# Patient Record
Sex: Male | Born: 1935 | Race: White | Hispanic: No | Marital: Married | Smoking: Former smoker
Health system: Southern US, Community
[De-identification: ages and names within clinical notes are randomized; demographics above are authoritative.]

## PROBLEM LIST (undated history)

## (undated) DIAGNOSIS — R0609 Other forms of dyspnea: Secondary | ICD-10-CM

## (undated) DIAGNOSIS — N183 Chronic kidney disease, stage 3 unspecified: Secondary | ICD-10-CM

## (undated) DIAGNOSIS — E039 Hypothyroidism, unspecified: Secondary | ICD-10-CM

## (undated) DIAGNOSIS — I639 Cerebral infarction, unspecified: Secondary | ICD-10-CM

## (undated) DIAGNOSIS — I209 Angina pectoris, unspecified: Secondary | ICD-10-CM

## (undated) DIAGNOSIS — Z8719 Personal history of other diseases of the digestive system: Secondary | ICD-10-CM

## (undated) DIAGNOSIS — I2510 Atherosclerotic heart disease of native coronary artery without angina pectoris: Secondary | ICD-10-CM

## (undated) DIAGNOSIS — R0902 Hypoxemia: Secondary | ICD-10-CM

## (undated) DIAGNOSIS — E119 Type 2 diabetes mellitus without complications: Secondary | ICD-10-CM

## (undated) DIAGNOSIS — R0600 Dyspnea, unspecified: Secondary | ICD-10-CM

## (undated) DIAGNOSIS — J449 Chronic obstructive pulmonary disease, unspecified: Secondary | ICD-10-CM

## (undated) DIAGNOSIS — I10 Essential (primary) hypertension: Secondary | ICD-10-CM

## (undated) DIAGNOSIS — E785 Hyperlipidemia, unspecified: Secondary | ICD-10-CM

## (undated) DIAGNOSIS — M1990 Unspecified osteoarthritis, unspecified site: Secondary | ICD-10-CM

## (undated) HISTORY — PX: CATARACT EXTRACTION W/ INTRAOCULAR LENS  IMPLANT, BILATERAL: SHX1307

## (undated) HISTORY — PX: EYE SURGERY: SHX253

## (undated) HISTORY — DX: Hypoxemia: R09.02

## (undated) HISTORY — DX: Chronic kidney disease, stage 3 (moderate): N18.3

## (undated) HISTORY — DX: Chronic obstructive pulmonary disease, unspecified: J44.9

## (undated) HISTORY — PX: CORONARY ANGIOPLASTY WITH STENT PLACEMENT: SHX49

## (undated) HISTORY — DX: Chronic kidney disease, stage 3 unspecified: N18.30

---

## 1997-07-25 HISTORY — PX: CORONARY ARTERY BYPASS GRAFT: SHX141

## 2006-03-25 ENCOUNTER — Emergency Department (HOSPITAL_COMMUNITY): Admission: EM | Admit: 2006-03-25 | Discharge: 2006-03-25 | Payer: Self-pay | Admitting: Emergency Medicine

## 2007-10-08 ENCOUNTER — Inpatient Hospital Stay (HOSPITAL_BASED_OUTPATIENT_CLINIC_OR_DEPARTMENT_OTHER): Admission: RE | Admit: 2007-10-08 | Discharge: 2007-10-08 | Payer: Self-pay | Admitting: Interventional Cardiology

## 2007-10-16 ENCOUNTER — Inpatient Hospital Stay (HOSPITAL_COMMUNITY): Admission: RE | Admit: 2007-10-16 | Discharge: 2007-10-17 | Payer: Self-pay | Admitting: Interventional Cardiology

## 2008-03-30 ENCOUNTER — Emergency Department (HOSPITAL_COMMUNITY): Admission: EM | Admit: 2008-03-30 | Discharge: 2008-03-30 | Payer: Self-pay | Admitting: Emergency Medicine

## 2008-09-30 ENCOUNTER — Emergency Department (HOSPITAL_COMMUNITY): Admission: EM | Admit: 2008-09-30 | Discharge: 2008-09-30 | Payer: Self-pay | Admitting: Emergency Medicine

## 2009-09-21 ENCOUNTER — Inpatient Hospital Stay (HOSPITAL_COMMUNITY): Admission: EM | Admit: 2009-09-21 | Discharge: 2009-09-26 | Payer: Self-pay | Admitting: Emergency Medicine

## 2009-09-25 ENCOUNTER — Encounter (INDEPENDENT_AMBULATORY_CARE_PROVIDER_SITE_OTHER): Payer: Self-pay | Admitting: Interventional Cardiology

## 2009-09-26 ENCOUNTER — Encounter (INDEPENDENT_AMBULATORY_CARE_PROVIDER_SITE_OTHER): Payer: Self-pay | Admitting: Internal Medicine

## 2009-09-26 ENCOUNTER — Ambulatory Visit: Payer: Self-pay | Admitting: Vascular Surgery

## 2010-09-23 DIAGNOSIS — I639 Cerebral infarction, unspecified: Secondary | ICD-10-CM

## 2010-09-23 HISTORY — DX: Cerebral infarction, unspecified: I63.9

## 2010-10-13 LAB — DIFFERENTIAL
Basophils Absolute: 0 10*3/uL (ref 0.0–0.1)
Basophils Relative: 0 % (ref 0–1)
Eosinophils Absolute: 0.1 10*3/uL (ref 0.0–0.7)
Eosinophils Relative: 1 % (ref 0–5)
Lymphocytes Relative: 5 % — ABNORMAL LOW (ref 12–46)
Lymphs Abs: 0.5 10*3/uL — ABNORMAL LOW (ref 0.7–4.0)
Monocytes Absolute: 0.6 10*3/uL (ref 0.1–1.0)
Monocytes Relative: 6 % (ref 3–12)
Neutro Abs: 8.8 10*3/uL — ABNORMAL HIGH (ref 1.7–7.7)
Neutrophils Relative %: 88 % — ABNORMAL HIGH (ref 43–77)

## 2010-10-13 LAB — BASIC METABOLIC PANEL
BUN: 27 mg/dL — ABNORMAL HIGH (ref 6–23)
CO2: 24 mEq/L (ref 19–32)
Calcium: 9.1 mg/dL (ref 8.4–10.5)
Chloride: 104 mEq/L (ref 96–112)
Creatinine, Ser: 1.28 mg/dL (ref 0.4–1.5)
GFR calc Af Amer: >60 mL/min (ref >60)
GFR calc non Af Amer: 55 mL/min — ABNORMAL LOW (ref >60)
Glucose, Bld: 157 mg/dL — ABNORMAL HIGH (ref 70–99)
Potassium: 4.7 mEq/L (ref 3.5–5.1)
Sodium: 135 mEq/L (ref 135–145)

## 2010-10-13 LAB — URINE CULTURE
Colony Count: NO GROWTH
Culture: NO GROWTH

## 2010-10-13 LAB — POCT CARDIAC MARKERS
CKMB, poc: <1 ng/mL — ABNORMAL LOW (ref 1.0–8.0)
Myoglobin, poc: 84.9 ng/mL (ref 12–200)
Troponin i, poc: <0.05 ng/mL (ref 0.00–0.09)

## 2010-10-13 LAB — CULTURE, BLOOD (ROUTINE X 2)

## 2010-10-13 LAB — URINALYSIS, ROUTINE W REFLEX MICROSCOPIC
Bilirubin Urine: NEGATIVE
Glucose, UA: NEGATIVE mg/dL
Ketones, ur: NEGATIVE mg/dL
Leukocytes, UA: NEGATIVE
Nitrite: NEGATIVE
Protein, ur: 100 mg/dL — AB
Specific Gravity, Urine: 1.015 (ref 1.005–1.030)
Urobilinogen, UA: 1 mg/dL (ref 0.0–1.0)
pH: 7 (ref 5.0–8.0)

## 2010-10-13 LAB — HEMOGLOBIN A1C
Hgb A1c MFr Bld: 7 % — ABNORMAL HIGH (ref 4.6–6.1)
Mean Plasma Glucose: 154 mg/dL

## 2010-10-13 LAB — CK TOTAL AND CKMB (NOT AT ARMC)
CK, MB: 0.9 ng/mL (ref 0.3–4.0)
Relative Index: 0.5 (ref 0.0–2.5)
Total CK: 198 U/L (ref 7–232)

## 2010-10-13 LAB — URINE MICROSCOPIC-ADD ON

## 2010-10-13 LAB — CBC
HCT: 39.2 % (ref 39.0–52.0)
Hemoglobin: 13.4 g/dL (ref 13.0–17.0)
MCHC: 34.3 g/dL (ref 30.0–36.0)
MCV: 89.9 fL (ref 78.0–100.0)
Platelets: 134 10*3/uL — ABNORMAL LOW (ref 150–400)
RBC: 4.36 MIL/uL (ref 4.22–5.81)
RDW: 13.2 % (ref 11.5–15.5)
WBC: 9.9 10*3/uL (ref 4.0–10.5)

## 2010-10-13 LAB — LACTIC ACID, PLASMA: Lactic Acid, Venous: 3.5 mmol/L — ABNORMAL HIGH (ref 0.5–2.2)

## 2010-10-13 LAB — BRAIN NATRIURETIC PEPTIDE: Pro B Natriuretic peptide (BNP): 281 pg/mL — ABNORMAL HIGH (ref 0.0–100.0)

## 2010-10-13 LAB — TSH: TSH: 2.017 u[IU]/mL (ref 0.350–4.500)

## 2010-10-13 LAB — TROPONIN I: Troponin I: 0.02 ng/mL (ref 0.00–0.06)

## 2010-10-17 LAB — CULTURE, BLOOD (ROUTINE X 2)
Culture: NO GROWTH
Culture: NO GROWTH

## 2010-10-17 LAB — COMPREHENSIVE METABOLIC PANEL
ALT: 13 U/L (ref 0–53)
AST: 27 U/L (ref 0–37)
Albumin: 3.3 g/dL — ABNORMAL LOW (ref 3.5–5.2)
Alkaline Phosphatase: 33 U/L — ABNORMAL LOW (ref 39–117)
BUN: 33 mg/dL — ABNORMAL HIGH (ref 6–23)
CO2: 27 mEq/L (ref 19–32)
Calcium: 8.1 mg/dL — ABNORMAL LOW (ref 8.4–10.5)
Chloride: 103 mEq/L (ref 96–112)
Creatinine, Ser: 1.85 mg/dL — ABNORMAL HIGH (ref 0.4–1.5)
GFR calc Af Amer: 43 mL/min — ABNORMAL LOW (ref >60)
GFR calc non Af Amer: 36 mL/min — ABNORMAL LOW (ref >60)
Glucose, Bld: 231 mg/dL — ABNORMAL HIGH (ref 70–99)
Potassium: 4.3 mEq/L (ref 3.5–5.1)
Sodium: 138 mEq/L (ref 135–145)
Total Bilirubin: 0.7 mg/dL (ref 0.3–1.2)
Total Protein: 6 g/dL (ref 6.0–8.3)

## 2010-10-17 LAB — CBC
HCT: 32.5 % — ABNORMAL LOW (ref 39.0–52.0)
HCT: 32.5 % — ABNORMAL LOW (ref 39.0–52.0)
HCT: 33.6 % — ABNORMAL LOW (ref 39.0–52.0)
HCT: 35.8 % — ABNORMAL LOW (ref 39.0–52.0)
Hemoglobin: 11.3 g/dL — ABNORMAL LOW (ref 13.0–17.0)
Hemoglobin: 11.3 g/dL — ABNORMAL LOW (ref 13.0–17.0)
Hemoglobin: 11.4 g/dL — ABNORMAL LOW (ref 13.0–17.0)
Hemoglobin: 12 g/dL — ABNORMAL LOW (ref 13.0–17.0)
MCHC: 33.6 g/dL (ref 30.0–36.0)
MCHC: 34 g/dL (ref 30.0–36.0)
MCHC: 34.7 g/dL (ref 30.0–36.0)
MCHC: 34.8 g/dL (ref 30.0–36.0)
MCV: 89.7 fL (ref 78.0–100.0)
MCV: 90 fL (ref 78.0–100.0)
MCV: 90.6 fL (ref 78.0–100.0)
MCV: 90.9 fL (ref 78.0–100.0)
Platelets: 104 10*3/uL — ABNORMAL LOW (ref 150–400)
Platelets: 125 10*3/uL — ABNORMAL LOW (ref 150–400)
Platelets: 135 10*3/uL — ABNORMAL LOW (ref 150–400)
Platelets: 144 10*3/uL — ABNORMAL LOW (ref 150–400)
RBC: 3.59 MIL/uL — ABNORMAL LOW (ref 4.22–5.81)
RBC: 3.61 MIL/uL — ABNORMAL LOW (ref 4.22–5.81)
RBC: 3.7 MIL/uL — ABNORMAL LOW (ref 4.22–5.81)
RBC: 3.99 MIL/uL — ABNORMAL LOW (ref 4.22–5.81)
RDW: 13 % (ref 11.5–15.5)
RDW: 13.3 % (ref 11.5–15.5)
RDW: 13.4 % (ref 11.5–15.5)
RDW: 13.4 % (ref 11.5–15.5)
WBC: 12.5 10*3/uL — ABNORMAL HIGH (ref 4.0–10.5)
WBC: 6.1 10*3/uL (ref 4.0–10.5)
WBC: 8.4 10*3/uL (ref 4.0–10.5)
WBC: 9.8 10*3/uL (ref 4.0–10.5)

## 2010-10-17 LAB — BASIC METABOLIC PANEL
BUN: 31 mg/dL — ABNORMAL HIGH (ref 6–23)
BUN: 34 mg/dL — ABNORMAL HIGH (ref 6–23)
BUN: 40 mg/dL — ABNORMAL HIGH (ref 6–23)
BUN: 40 mg/dL — ABNORMAL HIGH (ref 6–23)
CO2: 28 mEq/L (ref 19–32)
CO2: 32 mEq/L (ref 19–32)
CO2: 32 mEq/L (ref 19–32)
CO2: 34 mEq/L — ABNORMAL HIGH (ref 19–32)
Calcium: 8.2 mg/dL — ABNORMAL LOW (ref 8.4–10.5)
Calcium: 8.2 mg/dL — ABNORMAL LOW (ref 8.4–10.5)
Calcium: 8.4 mg/dL (ref 8.4–10.5)
Calcium: 8.8 mg/dL (ref 8.4–10.5)
Chloride: 100 mEq/L (ref 96–112)
Chloride: 100 mEq/L (ref 96–112)
Chloride: 99 mEq/L (ref 96–112)
Chloride: 99 mEq/L (ref 96–112)
Creatinine, Ser: 1.36 mg/dL (ref 0.4–1.5)
Creatinine, Ser: 1.43 mg/dL (ref 0.4–1.5)
Creatinine, Ser: 1.47 mg/dL (ref 0.4–1.5)
Creatinine, Ser: 1.54 mg/dL — ABNORMAL HIGH (ref 0.4–1.5)
GFR calc Af Amer: 54 mL/min — ABNORMAL LOW (ref >60)
GFR calc Af Amer: 57 mL/min — ABNORMAL LOW (ref >60)
GFR calc Af Amer: 58 mL/min — ABNORMAL LOW (ref >60)
GFR calc Af Amer: >60 mL/min (ref >60)
GFR calc non Af Amer: 44 mL/min — ABNORMAL LOW (ref >60)
GFR calc non Af Amer: 47 mL/min — ABNORMAL LOW (ref >60)
GFR calc non Af Amer: 48 mL/min — ABNORMAL LOW (ref >60)
GFR calc non Af Amer: 51 mL/min — ABNORMAL LOW (ref >60)
Glucose, Bld: 124 mg/dL — ABNORMAL HIGH (ref 70–99)
Glucose, Bld: 134 mg/dL — ABNORMAL HIGH (ref 70–99)
Glucose, Bld: 134 mg/dL — ABNORMAL HIGH (ref 70–99)
Glucose, Bld: 138 mg/dL — ABNORMAL HIGH (ref 70–99)
Potassium: 3.7 mEq/L (ref 3.5–5.1)
Potassium: 3.7 mEq/L (ref 3.5–5.1)
Potassium: 3.7 mEq/L (ref 3.5–5.1)
Potassium: 3.8 mEq/L (ref 3.5–5.1)
Sodium: 135 mEq/L (ref 135–145)
Sodium: 137 mEq/L (ref 135–145)
Sodium: 138 mEq/L (ref 135–145)
Sodium: 138 mEq/L (ref 135–145)

## 2010-10-17 LAB — GLUCOSE, CAPILLARY
Glucose-Capillary: 106 mg/dL — ABNORMAL HIGH (ref 70–99)
Glucose-Capillary: 122 mg/dL — ABNORMAL HIGH (ref 70–99)
Glucose-Capillary: 124 mg/dL — ABNORMAL HIGH (ref 70–99)
Glucose-Capillary: 125 mg/dL — ABNORMAL HIGH (ref 70–99)
Glucose-Capillary: 126 mg/dL — ABNORMAL HIGH (ref 70–99)
Glucose-Capillary: 129 mg/dL — ABNORMAL HIGH (ref 70–99)
Glucose-Capillary: 139 mg/dL — ABNORMAL HIGH (ref 70–99)
Glucose-Capillary: 141 mg/dL — ABNORMAL HIGH (ref 70–99)
Glucose-Capillary: 144 mg/dL — ABNORMAL HIGH (ref 70–99)
Glucose-Capillary: 144 mg/dL — ABNORMAL HIGH (ref 70–99)
Glucose-Capillary: 146 mg/dL — ABNORMAL HIGH (ref 70–99)
Glucose-Capillary: 164 mg/dL — ABNORMAL HIGH (ref 70–99)
Glucose-Capillary: 165 mg/dL — ABNORMAL HIGH (ref 70–99)
Glucose-Capillary: 165 mg/dL — ABNORMAL HIGH (ref 70–99)
Glucose-Capillary: 186 mg/dL — ABNORMAL HIGH (ref 70–99)
Glucose-Capillary: 207 mg/dL — ABNORMAL HIGH (ref 70–99)
Glucose-Capillary: 219 mg/dL — ABNORMAL HIGH (ref 70–99)
Glucose-Capillary: 312 mg/dL — ABNORMAL HIGH (ref 70–99)
Glucose-Capillary: 88 mg/dL (ref 70–99)

## 2010-10-17 LAB — CK TOTAL AND CKMB (NOT AT ARMC)
CK, MB: 1.2 ng/mL (ref 0.3–4.0)
CK, MB: 2 ng/mL (ref 0.3–4.0)
Relative Index: 0.6 (ref 0.0–2.5)
Relative Index: 0.6 (ref 0.0–2.5)
Total CK: 216 U/L (ref 7–232)
Total CK: 328 U/L — ABNORMAL HIGH (ref 7–232)

## 2010-10-17 LAB — TROPONIN I
Troponin I: 0.01 ng/mL (ref 0.00–0.06)
Troponin I: 0.01 ng/mL (ref 0.00–0.06)

## 2010-10-17 LAB — BRAIN NATRIURETIC PEPTIDE: Pro B Natriuretic peptide (BNP): 1038 pg/mL — ABNORMAL HIGH (ref 0.0–100.0)

## 2010-12-07 NOTE — Cardiovascular Report (Signed)
NAME:  Luke [name], Luke [name] NO.:  [id number]   MEDICAL RECORD NO.:  [id number]          PATIENT TYPE:  INP   LOCATION:  [id number]                         FACILITY:  [facility]   PHYSICIAN:  Jettie Booze, MDDATE OF BIRTH:  [birth year]   DATE OF PROCEDURE:  10/16/2007  DATE OF DISCHARGE:                            CARDIAC CATHETERIZATION   PROCEDURES PERFORMED:  PCI of the mid LAD.   OPERATOR:  Dr. Irish Lack.   INDICATIONS:  Stable angina, coronary artery disease.   PROCEDURE:  The risks and benefits of cardiac catheterization were  explained to the patient and informed consent was obtained.  The patient  was brought to the cath lab.  He was prepped and draped in the usual  sterile fashion involving his right brachial area.  A 6-French arterial  sheath was placed into the right brachial artery using modified  Seldinger technique.  Initially an IMA catheter was advanced to the [place]  as the diagnostic catheterization has shown his [place] goes to the mid  LAD.  It was distal to the insertion of the [place] where his 90% stenosis  was.  The IMA catheter was unsuccessful in engaging the [place].  We then  tried a renal double curve catheter, with no success as well.  A BR 80-  cm guide catheter was then tried and unable to reach the vessel.  Ultimately, a VB guide catheter did successfully engage the right  internal mammary artery.  A BMW wire was placed down the vessel.  The  2.5 x 15-mm Maverick balloon was then placed across the lesion and  inflated to 6 atmospheres for 26 seconds.  A 2.75 x 23-mm PROMUS stent  was then placed across the lesion and deployed at 12 atmospheres for 46  seconds.  The proximal and midportion of the stent were postdilated with  a with a 3 x 15-mm Quantum balloon to 18 atmospheres for 26 seconds and  then again to 18 atmospheres for 30 seconds.  There was an excellent  angiographic result.  TIMI III flow was maintained throughout.  The 90%  stenosis was  reduced to 05.  This was a technically difficult case  because of difficulty in engaging the right internal mammary from the  brachial approach.  In addition, the length of guide catheter was  important in terms of allowing the balloon and stent to actually reach  such a distal lesion.  This significantly increased the time required to  perform this procedure, which ultimately turned out to be about 2 hours.   IMPRESSION:  Successful percutaneous coronary intervention of the mid to  distal left anterior descending in the portion distal to the right  internal mammary artery insertion.  A 2.75 x 23-mm PROMUS stent was used  and post dilated to 3.1 mm in diameter.   RECOMMENDATIONS:  Will hydrate the patient with IV bicarb.  He will be  watched overnight.  Continue aspirin and Plavix for a minimum of a year.  If his angina is resolved with further medical therapy, will not plan  any further invasive treatment.  If he does continue  to have angina, I  would consider bringing him back for PCI of his proximal circumflex.  Will continue medical therapy for now.      Jettie Booze, MD  Electronically Signed    JSV/MEDQ  D:  10/16/2007  T:  10/16/2007  Job:  [id number]

## 2010-12-07 NOTE — Cardiovascular Report (Signed)
NAME:  Luke [name], MAGA NO.:  [id number]   MEDICAL RECORD NO.:  [id number]          PATIENT TYPE:  OIB   LOCATION:  1962                         FACILITY:  [facility]   PHYSICIAN:  Jettie Booze, MDDATE OF BIRTH:  [birth year]   DATE OF PROCEDURE:  10/08/2007  DATE OF DISCHARGE:  10/08/2007                            CARDIAC CATHETERIZATION   PROCEDURES PERFORMED:  1. Left heart catheterization.  2. Coronary angiogram.  3. Arterial and venous bypass angiography.   OPERATOR:  Jettie Booze, MD   INDICATIONS:  Angina in a patient with a history of coronary artery  disease.   PROCEDURAL NARRATIVE:  The risks and benefits of cardiac catheterization  were explained to the patient and informed consent was obtained.  The  patient was brought to the catheterization lab early and given a bicarb  drip for renal protection.  He was placed on the table and prepped and  draped in the usual sterile fashion.  His right groin was infiltrated  with 1% lidocaine.  A 4-French arterial sheath was placed into the right  femoral artery using the modified Seldinger technique.  Left coronary  artery angiography was initially attempted with a JL-4.0 catheter but  was unsuccessful.  Upon catheter exchange the guidewire went into the  left subclavian.  Therefore, an IMA catheter was advanced to the LIMA.  Digital angiography was performed in multiple projections using hand  injection of contrast.  The catheter was then exchanged out for a JL-5.0  catheter.  This was used to image the left coronary system in multiple  projections using hand injection of contrast.  A 3DRC catheter was then  placed into the ostium of the right coronary artery and the native right  coronary artery was imaged.  The catheter was then used to engage the  innominate artery.  The catheter was advanced over a wire to the [place].  Digital angiography was performed in multiple projections using hand  injection  of contrast.  A JR-4 catheter was then placed to try and  engage the SVG graft.  This was unsuccessful.  An AL-1 catheter was then  used to visualize the SVG to diagonal in multiple projections using hand  injection of contrast.  An RCB catheter was then used to engage the SVG  to PDA.   FINDINGS:  The LIMA to OM was widely patent.  The OM was small with  diffuse disease.  Proximal to the anastomosis the OM was occluded and  therefore there was no other back flow into the circumflex system from  the LIMA graft.  The left main was widely patent.  The left circumflex  was a large vessel proximally.  There is a 60-70% midvessel stenosis.  There is an OM-2, which was diffusely diseased.  I believe the OM-1 was  occluded at this what the LIMA graft went to.  The left anterior  descending was diffusely diseased proximally.  The first diagonal was  subtotally occluded.  There is competitive flow in the distal LAD  territory.  The RIMA to LAD was widely patent.  Just past the insertion  into the LAD there is an 80% lesion.  The right coronary artery was  occluded in the midportion.  The SVG to diagonal is widely patent.  The  SVG to RCA was difficult to engage but also appeared widely patent.   HEMODYNAMICS:  Left ventricular pressure of 166/20 with an LVEDP of 24  mmHg.  Aortic pressure of 167/60 with a mean aortic pressure of 104  mmHg.   IMPRESSION:  1. Patent flow to the right coronary artery and diagonal #1 through      vein graft.  2. Patent left internal mammary artery to obtuse marginal and right      internal mammary artery to left anterior descending artery.  3. Mid circumflex disease which does not appear to be bypassed.  4. Mid to distal left anterior descending artery disease past the      insertion of the right internal mammary artery, which is also not      bypassed.   RECOMMENDATIONS:  Consider PCI of the left circumflex and mid to distal  LAD for the relief of the  patient's angina.  Start him on Plavix.  He  will need lab work checked to be sure he did not have any kidney issues  during this procedure.  Our office will be in contact with him when it  is time to have his lab work done.  Continue aggressive secondary  prevention.      Jettie Booze, MD  Electronically Signed     JSV/MEDQ  D:  10/09/2007  T:  10/10/2007  Job:  [id number]

## 2010-12-10 NOTE — Discharge Summary (Signed)
[name], [name] NO.:  [id number]   MEDICAL RECORD NO.:  [id number]          PATIENT TYPE:  INP   LOCATION:  [id number]                         FACILITY:  [facility]   PHYSICIAN:  Jettie Booze, MDDATE OF BIRTH:  1933/01/11   DATE OF ADMISSION:  10/16/2007  DATE OF DISCHARGE:  10/17/2007                               DISCHARGE SUMMARY   DISCHARGE DIAGNOSES:  1. Coronary artery disease status post drug-eluting stent to the LAD      through the right brachial approach.  2. Hyperlipidemia treated.  3. Diabetes mellitus.  4. Hypertension.   HISTORY OF PRESENT ILLNESS:  [name] is a 77 year old male patient  who has known coronary artery disease in 1999, when he was in [place]  he had coronary artery bypass surgery with the following grafts:  LIMA  to the obtuse marginal, RIMA to the LAD, saphenous vein graft to the  diagonal, saphenous vein graft to the PDA.  Recently, he discovered that  he had discomfort in the central part of his chest when he gets up and  down the ladders at work, and it is relieved with rest.  He then  underwent a stress Cardiolite that was mildly abnormal.  He was then  admitted to the [facility] hospital on October 08, 2007 for cardiac  catheterization.  The catheterization showed just past insertion of the  LAD with an 80% lesion.  The LIMA to the OM was patent.  The OM was  small with diffuse disease proximal to the anastomosis and was occluded  and there was no backflow into the circumflex system from the LIMA  graft.  The left main was mildly patent.  The circumflex was large with  a 60% mid vessel stenosis.  The OM2 was diffusely diseased.  The LIMA to  the LAD was widely patent.  The saphenous vein graft to the diagonal was  patent, the saphenous vein graft to the right coronary artery appeared  patent.   For these reasons the patient was readmitted to [facility] on October 16, 2007 for percutaneous intervention to the mid LAD.  A  drug-eluting  stent was implanted to this region.  The patient remained in the  hospital overnight, was ready for discharge to home the following day.   LABORATORY DATA:  Lab studies showed hemoglobin 12.4, hematocrit 35.1,  platelets 181, white count 7.6 , sodium 136, potassium 4.3,  BUN 18, and  creatinine 1.4.   DISCHARGE MEDICATIONS:  1. Plavix 75 mg a day.  2. Enteric-coated aspirin 325 mg a day.  3. Sublingual nitroglycerin p.r.n. chest pain.  4. Maxzide 1 a day.  5. Actoplus twice a day to begin on October 19, 2007.  6. Crestor 20 mg twice a day.  7. Vasotec 20 mg twice a day.  8. Glyburide 5 mg twice a day.  9. Atenolol 25 mg once a day.   The patient is to remain on the low-sodium, heart-healthy diabetic diet.  Clean cath site gently with soap and water.  No scrubbing.  Follow up  with Dr. Irish Lack on October 30, 2007 at 11:30 a.m.  The patient is to  increase activity slowly.  No lifting over 10 pounds for 1 week.  No  driving for 2 days.      Joesphine Bare, P.A.      Jettie Booze, MD  Electronically Signed    LB/MEDQ  D:  11/13/2007  T:  11/14/2007  Job:  [id number]

## 2011-04-18 LAB — BASIC METABOLIC PANEL
BUN: 18
CO2: 30
Calcium: 8.7
Chloride: 97
Creatinine, Ser: 1.42
GFR calc Af Amer: 59 — ABNORMAL LOW
GFR calc non Af Amer: 49 — ABNORMAL LOW
Glucose, Bld: 94
Potassium: 4.3
Sodium: 136

## 2011-04-18 LAB — CBC
HCT: 35.1 — ABNORMAL LOW
Hemoglobin: 12.4 — ABNORMAL LOW
MCHC: 35.2
MCV: 87.9
Platelets: 181
RBC: 3.99 — ABNORMAL LOW
RDW: 13.2
WBC: 7.6

## 2011-04-18 LAB — HEMOGLOBIN A1C
Hgb A1c MFr Bld: 7.8 — ABNORMAL HIGH
Mean Plasma Glucose: 200

## 2011-04-18 LAB — POCT I-STAT GLUCOSE
Glucose, Bld: 149 — ABNORMAL HIGH
Operator id: 141321

## 2011-08-01 DIAGNOSIS — Z1211 Encounter for screening for malignant neoplasm of colon: Secondary | ICD-10-CM | POA: Diagnosis not present

## 2011-08-15 DIAGNOSIS — E039 Hypothyroidism, unspecified: Secondary | ICD-10-CM | POA: Diagnosis not present

## 2011-08-15 DIAGNOSIS — J40 Bronchitis, not specified as acute or chronic: Secondary | ICD-10-CM | POA: Diagnosis not present

## 2011-08-15 DIAGNOSIS — I10 Essential (primary) hypertension: Secondary | ICD-10-CM | POA: Diagnosis not present

## 2011-08-18 DIAGNOSIS — R635 Abnormal weight gain: Secondary | ICD-10-CM | POA: Diagnosis not present

## 2011-08-18 DIAGNOSIS — Z1211 Encounter for screening for malignant neoplasm of colon: Secondary | ICD-10-CM | POA: Diagnosis not present

## 2011-08-18 DIAGNOSIS — D509 Iron deficiency anemia, unspecified: Secondary | ICD-10-CM | POA: Diagnosis not present

## 2011-08-26 DIAGNOSIS — R809 Proteinuria, unspecified: Secondary | ICD-10-CM | POA: Diagnosis not present

## 2011-08-26 DIAGNOSIS — I10 Essential (primary) hypertension: Secondary | ICD-10-CM | POA: Diagnosis not present

## 2011-08-26 DIAGNOSIS — N183 Chronic kidney disease, stage 3 unspecified: Secondary | ICD-10-CM | POA: Diagnosis not present

## 2011-08-26 DIAGNOSIS — E119 Type 2 diabetes mellitus without complications: Secondary | ICD-10-CM | POA: Diagnosis not present

## 2011-08-26 DIAGNOSIS — I129 Hypertensive chronic kidney disease with stage 1 through stage 4 chronic kidney disease, or unspecified chronic kidney disease: Secondary | ICD-10-CM | POA: Diagnosis not present

## 2011-09-06 DIAGNOSIS — N183 Chronic kidney disease, stage 3 unspecified: Secondary | ICD-10-CM | POA: Diagnosis not present

## 2011-09-06 DIAGNOSIS — R809 Proteinuria, unspecified: Secondary | ICD-10-CM | POA: Diagnosis not present

## 2011-09-06 DIAGNOSIS — I120 Hypertensive chronic kidney disease with stage 5 chronic kidney disease or end stage renal disease: Secondary | ICD-10-CM | POA: Diagnosis not present

## 2011-09-20 DIAGNOSIS — I120 Hypertensive chronic kidney disease with stage 5 chronic kidney disease or end stage renal disease: Secondary | ICD-10-CM | POA: Diagnosis not present

## 2011-09-20 DIAGNOSIS — N183 Chronic kidney disease, stage 3 unspecified: Secondary | ICD-10-CM | POA: Diagnosis not present

## 2011-09-22 DIAGNOSIS — H35049 Retinal micro-aneurysms, unspecified, unspecified eye: Secondary | ICD-10-CM | POA: Diagnosis not present

## 2011-09-22 DIAGNOSIS — E11349 Type 2 diabetes mellitus with severe nonproliferative diabetic retinopathy without macular edema: Secondary | ICD-10-CM | POA: Diagnosis not present

## 2011-09-22 DIAGNOSIS — E1139 Type 2 diabetes mellitus with other diabetic ophthalmic complication: Secondary | ICD-10-CM | POA: Diagnosis not present

## 2011-09-22 DIAGNOSIS — H43829 Vitreomacular adhesion, unspecified eye: Secondary | ICD-10-CM | POA: Diagnosis not present

## 2011-09-22 DIAGNOSIS — N183 Chronic kidney disease, stage 3 unspecified: Secondary | ICD-10-CM | POA: Diagnosis not present

## 2011-09-22 DIAGNOSIS — N185 Chronic kidney disease, stage 5: Secondary | ICD-10-CM | POA: Diagnosis not present

## 2011-09-23 HISTORY — PX: COLONOSCOPY W/ POLYPECTOMY: SHX1380

## 2011-09-28 ENCOUNTER — Other Ambulatory Visit: Payer: Self-pay | Admitting: Nephrology

## 2011-09-28 DIAGNOSIS — N2889 Other specified disorders of kidney and ureter: Secondary | ICD-10-CM

## 2011-10-04 ENCOUNTER — Ambulatory Visit
Admission: RE | Admit: 2011-10-04 | Discharge: 2011-10-04 | Disposition: A | Discharge status: Home / Self Care | Payer: Medicare Other | Source: Ambulatory Visit | Attending: Nephrology | Admitting: Nephrology

## 2011-10-04 DIAGNOSIS — N2889 Other specified disorders of kidney and ureter: Secondary | ICD-10-CM

## 2011-10-04 DIAGNOSIS — N289 Disorder of kidney and ureter, unspecified: Secondary | ICD-10-CM | POA: Diagnosis not present

## 2011-10-05 DIAGNOSIS — E1139 Type 2 diabetes mellitus with other diabetic ophthalmic complication: Secondary | ICD-10-CM | POA: Diagnosis not present

## 2011-10-05 DIAGNOSIS — H43829 Vitreomacular adhesion, unspecified eye: Secondary | ICD-10-CM | POA: Diagnosis not present

## 2011-10-05 DIAGNOSIS — E11311 Type 2 diabetes mellitus with unspecified diabetic retinopathy with macular edema: Secondary | ICD-10-CM | POA: Diagnosis not present

## 2011-10-06 DIAGNOSIS — I129 Hypertensive chronic kidney disease with stage 1 through stage 4 chronic kidney disease, or unspecified chronic kidney disease: Secondary | ICD-10-CM | POA: Diagnosis not present

## 2011-10-06 DIAGNOSIS — N183 Chronic kidney disease, stage 3 unspecified: Secondary | ICD-10-CM | POA: Diagnosis not present

## 2011-10-06 DIAGNOSIS — R809 Proteinuria, unspecified: Secondary | ICD-10-CM | POA: Diagnosis not present

## 2011-10-06 DIAGNOSIS — E119 Type 2 diabetes mellitus without complications: Secondary | ICD-10-CM | POA: Diagnosis not present

## 2011-10-10 DIAGNOSIS — K259 Gastric ulcer, unspecified as acute or chronic, without hemorrhage or perforation: Secondary | ICD-10-CM | POA: Diagnosis not present

## 2011-10-10 DIAGNOSIS — Z1211 Encounter for screening for malignant neoplasm of colon: Secondary | ICD-10-CM | POA: Diagnosis not present

## 2011-10-10 DIAGNOSIS — K559 Vascular disorder of intestine, unspecified: Secondary | ICD-10-CM | POA: Diagnosis not present

## 2011-10-10 DIAGNOSIS — D649 Anemia, unspecified: Secondary | ICD-10-CM | POA: Diagnosis not present

## 2011-10-10 DIAGNOSIS — D509 Iron deficiency anemia, unspecified: Secondary | ICD-10-CM | POA: Diagnosis not present

## 2011-10-10 DIAGNOSIS — D126 Benign neoplasm of colon, unspecified: Secondary | ICD-10-CM | POA: Diagnosis not present

## 2011-10-13 DIAGNOSIS — H43829 Vitreomacular adhesion, unspecified eye: Secondary | ICD-10-CM | POA: Diagnosis not present

## 2011-10-17 DIAGNOSIS — I2510 Atherosclerotic heart disease of native coronary artery without angina pectoris: Secondary | ICD-10-CM | POA: Diagnosis not present

## 2011-10-17 DIAGNOSIS — R0602 Shortness of breath: Secondary | ICD-10-CM | POA: Diagnosis not present

## 2011-10-17 DIAGNOSIS — E782 Mixed hyperlipidemia: Secondary | ICD-10-CM | POA: Diagnosis not present

## 2011-10-17 DIAGNOSIS — E669 Obesity, unspecified: Secondary | ICD-10-CM | POA: Diagnosis not present

## 2011-10-24 DIAGNOSIS — I2510 Atherosclerotic heart disease of native coronary artery without angina pectoris: Secondary | ICD-10-CM | POA: Diagnosis not present

## 2011-10-24 DIAGNOSIS — E669 Obesity, unspecified: Secondary | ICD-10-CM | POA: Diagnosis not present

## 2011-10-24 DIAGNOSIS — R0602 Shortness of breath: Secondary | ICD-10-CM | POA: Diagnosis not present

## 2011-10-24 DIAGNOSIS — E782 Mixed hyperlipidemia: Secondary | ICD-10-CM | POA: Diagnosis not present

## 2011-10-24 HISTORY — PX: RETINAL DETACHMENT SURGERY: SHX105

## 2011-10-27 DIAGNOSIS — I120 Hypertensive chronic kidney disease with stage 5 chronic kidney disease or end stage renal disease: Secondary | ICD-10-CM | POA: Diagnosis not present

## 2011-10-27 DIAGNOSIS — K5289 Other specified noninfective gastroenteritis and colitis: Secondary | ICD-10-CM | POA: Diagnosis not present

## 2011-10-27 DIAGNOSIS — N183 Chronic kidney disease, stage 3 unspecified: Secondary | ICD-10-CM | POA: Diagnosis not present

## 2011-10-27 DIAGNOSIS — Z86010 Personal history of colonic polyps: Secondary | ICD-10-CM | POA: Diagnosis not present

## 2011-10-27 DIAGNOSIS — R635 Abnormal weight gain: Secondary | ICD-10-CM | POA: Diagnosis not present

## 2011-11-17 DIAGNOSIS — I120 Hypertensive chronic kidney disease with stage 5 chronic kidney disease or end stage renal disease: Secondary | ICD-10-CM | POA: Diagnosis not present

## 2011-11-17 DIAGNOSIS — N183 Chronic kidney disease, stage 3 unspecified: Secondary | ICD-10-CM | POA: Diagnosis not present

## 2011-12-20 DIAGNOSIS — E118 Type 2 diabetes mellitus with unspecified complications: Secondary | ICD-10-CM | POA: Diagnosis not present

## 2011-12-20 DIAGNOSIS — N183 Chronic kidney disease, stage 3 unspecified: Secondary | ICD-10-CM | POA: Diagnosis not present

## 2011-12-20 DIAGNOSIS — Z5181 Encounter for therapeutic drug level monitoring: Secondary | ICD-10-CM | POA: Diagnosis not present

## 2011-12-20 DIAGNOSIS — IMO0002 Reserved for concepts with insufficient information to code with codable children: Secondary | ICD-10-CM | POA: Diagnosis not present

## 2011-12-20 DIAGNOSIS — E039 Hypothyroidism, unspecified: Secondary | ICD-10-CM | POA: Diagnosis not present

## 2011-12-20 DIAGNOSIS — E7800 Pure hypercholesterolemia, unspecified: Secondary | ICD-10-CM | POA: Diagnosis not present

## 2011-12-20 DIAGNOSIS — I10 Essential (primary) hypertension: Secondary | ICD-10-CM | POA: Diagnosis not present

## 2011-12-20 DIAGNOSIS — E1165 Type 2 diabetes mellitus with hyperglycemia: Secondary | ICD-10-CM | POA: Diagnosis not present

## 2011-12-21 DIAGNOSIS — E1165 Type 2 diabetes mellitus with hyperglycemia: Secondary | ICD-10-CM | POA: Diagnosis not present

## 2011-12-21 DIAGNOSIS — IMO0002 Reserved for concepts with insufficient information to code with codable children: Secondary | ICD-10-CM | POA: Diagnosis not present

## 2011-12-21 DIAGNOSIS — E118 Type 2 diabetes mellitus with unspecified complications: Secondary | ICD-10-CM | POA: Diagnosis not present

## 2011-12-22 DIAGNOSIS — I10 Essential (primary) hypertension: Secondary | ICD-10-CM | POA: Diagnosis not present

## 2011-12-22 DIAGNOSIS — Z794 Long term (current) use of insulin: Secondary | ICD-10-CM | POA: Diagnosis not present

## 2011-12-22 DIAGNOSIS — I2510 Atherosclerotic heart disease of native coronary artery without angina pectoris: Secondary | ICD-10-CM | POA: Diagnosis not present

## 2011-12-22 DIAGNOSIS — E669 Obesity, unspecified: Secondary | ICD-10-CM | POA: Diagnosis not present

## 2011-12-22 DIAGNOSIS — E119 Type 2 diabetes mellitus without complications: Secondary | ICD-10-CM | POA: Diagnosis not present

## 2011-12-22 DIAGNOSIS — Z951 Presence of aortocoronary bypass graft: Secondary | ICD-10-CM | POA: Diagnosis not present

## 2011-12-26 DIAGNOSIS — E1165 Type 2 diabetes mellitus with hyperglycemia: Secondary | ICD-10-CM | POA: Diagnosis not present

## 2011-12-26 DIAGNOSIS — IMO0002 Reserved for concepts with insufficient information to code with codable children: Secondary | ICD-10-CM | POA: Diagnosis not present

## 2011-12-26 DIAGNOSIS — E118 Type 2 diabetes mellitus with unspecified complications: Secondary | ICD-10-CM | POA: Diagnosis not present

## 2011-12-26 DIAGNOSIS — E7800 Pure hypercholesterolemia, unspecified: Secondary | ICD-10-CM | POA: Diagnosis not present

## 2011-12-26 DIAGNOSIS — N183 Chronic kidney disease, stage 3 unspecified: Secondary | ICD-10-CM | POA: Diagnosis not present

## 2011-12-26 DIAGNOSIS — I10 Essential (primary) hypertension: Secondary | ICD-10-CM | POA: Diagnosis not present

## 2011-12-26 DIAGNOSIS — E039 Hypothyroidism, unspecified: Secondary | ICD-10-CM | POA: Diagnosis not present

## 2011-12-26 DIAGNOSIS — I2510 Atherosclerotic heart disease of native coronary artery without angina pectoris: Secondary | ICD-10-CM | POA: Diagnosis not present

## 2012-01-02 DIAGNOSIS — IMO0002 Reserved for concepts with insufficient information to code with codable children: Secondary | ICD-10-CM | POA: Diagnosis not present

## 2012-01-02 DIAGNOSIS — E1165 Type 2 diabetes mellitus with hyperglycemia: Secondary | ICD-10-CM | POA: Diagnosis not present

## 2012-01-03 DIAGNOSIS — E039 Hypothyroidism, unspecified: Secondary | ICD-10-CM | POA: Diagnosis not present

## 2012-01-03 DIAGNOSIS — IMO0001 Reserved for inherently not codable concepts without codable children: Secondary | ICD-10-CM | POA: Diagnosis not present

## 2012-01-03 DIAGNOSIS — I10 Essential (primary) hypertension: Secondary | ICD-10-CM | POA: Diagnosis not present

## 2012-01-09 DIAGNOSIS — E1129 Type 2 diabetes mellitus with other diabetic kidney complication: Secondary | ICD-10-CM | POA: Diagnosis not present

## 2012-01-09 DIAGNOSIS — E1165 Type 2 diabetes mellitus with hyperglycemia: Secondary | ICD-10-CM | POA: Diagnosis not present

## 2012-01-09 DIAGNOSIS — E039 Hypothyroidism, unspecified: Secondary | ICD-10-CM | POA: Diagnosis not present

## 2012-01-09 DIAGNOSIS — R7309 Other abnormal glucose: Secondary | ICD-10-CM | POA: Diagnosis not present

## 2012-01-09 DIAGNOSIS — E785 Hyperlipidemia, unspecified: Secondary | ICD-10-CM | POA: Diagnosis not present

## 2012-01-09 DIAGNOSIS — N183 Chronic kidney disease, stage 3 unspecified: Secondary | ICD-10-CM | POA: Diagnosis not present

## 2012-01-09 DIAGNOSIS — I10 Essential (primary) hypertension: Secondary | ICD-10-CM | POA: Diagnosis not present

## 2012-01-09 DIAGNOSIS — Z794 Long term (current) use of insulin: Secondary | ICD-10-CM | POA: Diagnosis not present

## 2012-01-25 DIAGNOSIS — I10 Essential (primary) hypertension: Secondary | ICD-10-CM | POA: Diagnosis not present

## 2012-01-25 DIAGNOSIS — E785 Hyperlipidemia, unspecified: Secondary | ICD-10-CM | POA: Diagnosis not present

## 2012-01-25 DIAGNOSIS — E119 Type 2 diabetes mellitus without complications: Secondary | ICD-10-CM | POA: Diagnosis not present

## 2012-01-25 DIAGNOSIS — N2581 Secondary hyperparathyroidism of renal origin: Secondary | ICD-10-CM | POA: Diagnosis not present

## 2012-01-25 DIAGNOSIS — E213 Hyperparathyroidism, unspecified: Secondary | ICD-10-CM | POA: Diagnosis not present

## 2012-01-25 DIAGNOSIS — I129 Hypertensive chronic kidney disease with stage 1 through stage 4 chronic kidney disease, or unspecified chronic kidney disease: Secondary | ICD-10-CM | POA: Diagnosis not present

## 2012-01-25 DIAGNOSIS — N183 Chronic kidney disease, stage 3 unspecified: Secondary | ICD-10-CM | POA: Diagnosis not present

## 2012-02-01 DIAGNOSIS — E785 Hyperlipidemia, unspecified: Secondary | ICD-10-CM | POA: Diagnosis not present

## 2012-02-01 DIAGNOSIS — E039 Hypothyroidism, unspecified: Secondary | ICD-10-CM | POA: Diagnosis not present

## 2012-02-01 DIAGNOSIS — I10 Essential (primary) hypertension: Secondary | ICD-10-CM | POA: Diagnosis not present

## 2012-02-01 DIAGNOSIS — Z794 Long term (current) use of insulin: Secondary | ICD-10-CM | POA: Diagnosis not present

## 2012-02-01 DIAGNOSIS — E1129 Type 2 diabetes mellitus with other diabetic kidney complication: Secondary | ICD-10-CM | POA: Diagnosis not present

## 2012-02-01 DIAGNOSIS — N183 Chronic kidney disease, stage 3 unspecified: Secondary | ICD-10-CM | POA: Diagnosis not present

## 2012-02-01 DIAGNOSIS — R7309 Other abnormal glucose: Secondary | ICD-10-CM | POA: Diagnosis not present

## 2012-02-01 DIAGNOSIS — E1165 Type 2 diabetes mellitus with hyperglycemia: Secondary | ICD-10-CM | POA: Diagnosis not present

## 2012-02-08 ENCOUNTER — Encounter (HOSPITAL_COMMUNITY): Payer: Self-pay | Admitting: *Deleted

## 2012-02-08 ENCOUNTER — Emergency Department (HOSPITAL_COMMUNITY): Payer: Medicare Other

## 2012-02-08 ENCOUNTER — Emergency Department (HOSPITAL_COMMUNITY)
Admission: EM | Admit: 2012-02-08 | Discharge: 2012-02-08 | Disposition: A | Discharge status: Home / Self Care | Payer: Medicare Other | Attending: Emergency Medicine | Admitting: Emergency Medicine

## 2012-02-08 DIAGNOSIS — J984 Other disorders of lung: Secondary | ICD-10-CM | POA: Diagnosis not present

## 2012-02-08 DIAGNOSIS — Z87891 Personal history of nicotine dependence: Secondary | ICD-10-CM | POA: Insufficient documentation

## 2012-02-08 DIAGNOSIS — E119 Type 2 diabetes mellitus without complications: Secondary | ICD-10-CM | POA: Diagnosis not present

## 2012-02-08 DIAGNOSIS — I10 Essential (primary) hypertension: Secondary | ICD-10-CM | POA: Insufficient documentation

## 2012-02-08 DIAGNOSIS — Z951 Presence of aortocoronary bypass graft: Secondary | ICD-10-CM | POA: Diagnosis not present

## 2012-02-08 DIAGNOSIS — I209 Angina pectoris, unspecified: Secondary | ICD-10-CM

## 2012-02-08 DIAGNOSIS — Z794 Long term (current) use of insulin: Secondary | ICD-10-CM | POA: Diagnosis not present

## 2012-02-08 DIAGNOSIS — Z8739 Personal history of other diseases of the musculoskeletal system and connective tissue: Secondary | ICD-10-CM | POA: Insufficient documentation

## 2012-02-08 DIAGNOSIS — E039 Hypothyroidism, unspecified: Secondary | ICD-10-CM | POA: Diagnosis not present

## 2012-02-08 DIAGNOSIS — R079 Chest pain, unspecified: Secondary | ICD-10-CM | POA: Diagnosis not present

## 2012-02-08 DIAGNOSIS — Z8673 Personal history of transient ischemic attack (TIA), and cerebral infarction without residual deficits: Secondary | ICD-10-CM | POA: Diagnosis not present

## 2012-02-08 DIAGNOSIS — R1013 Epigastric pain: Secondary | ICD-10-CM | POA: Diagnosis not present

## 2012-02-08 DIAGNOSIS — E785 Hyperlipidemia, unspecified: Secondary | ICD-10-CM | POA: Diagnosis not present

## 2012-02-08 DIAGNOSIS — R1310 Dysphagia, unspecified: Secondary | ICD-10-CM | POA: Diagnosis not present

## 2012-02-08 DIAGNOSIS — I2510 Atherosclerotic heart disease of native coronary artery without angina pectoris: Secondary | ICD-10-CM | POA: Insufficient documentation

## 2012-02-08 HISTORY — DX: Hyperlipidemia, unspecified: E78.5

## 2012-02-08 HISTORY — DX: Cerebral infarction, unspecified: I63.9

## 2012-02-08 HISTORY — DX: Unspecified osteoarthritis, unspecified site: M19.90

## 2012-02-08 HISTORY — DX: Angina pectoris, unspecified: I20.9

## 2012-02-08 HISTORY — DX: Other forms of dyspnea: R06.09

## 2012-02-08 HISTORY — DX: Atherosclerotic heart disease of native coronary artery without angina pectoris: I25.10

## 2012-02-08 HISTORY — DX: Type 2 diabetes mellitus without complications: E11.9

## 2012-02-08 HISTORY — DX: Hypothyroidism, unspecified: E03.9

## 2012-02-08 HISTORY — DX: Dyspnea, unspecified: R06.00

## 2012-02-08 HISTORY — DX: Essential (primary) hypertension: I10

## 2012-02-08 HISTORY — DX: Personal history of other diseases of the digestive system: Z87.19

## 2012-02-08 LAB — CBC
HCT: 41.5 % (ref 39.0–52.0)
Hemoglobin: 14.2 g/dL (ref 13.0–17.0)
MCH: 30.5 pg (ref 26.0–34.0)
MCHC: 34.2 g/dL (ref 30.0–36.0)
MCV: 89.2 fL (ref 78.0–100.0)
Platelets: 192 10*3/uL (ref 150–400)
RBC: 4.65 MIL/uL (ref 4.22–5.81)
RDW: 13.4 % (ref 11.5–15.5)
WBC: 5.8 10*3/uL (ref 4.0–10.5)

## 2012-02-08 LAB — POCT I-STAT TROPONIN I: Troponin i, poc: 0 ng/mL (ref 0.00–0.08)

## 2012-02-08 LAB — BASIC METABOLIC PANEL
BUN: 24 mg/dL — ABNORMAL HIGH (ref 6–23)
CO2: 28 mEq/L (ref 19–32)
Calcium: 9.6 mg/dL (ref 8.4–10.5)
Chloride: 100 mEq/L (ref 96–112)
Creatinine, Ser: 1.53 mg/dL — ABNORMAL HIGH (ref 0.50–1.35)
GFR calc Af Amer: 49 mL/min — ABNORMAL LOW (ref >90)
GFR calc non Af Amer: 42 mL/min — ABNORMAL LOW (ref >90)
Glucose, Bld: 147 mg/dL — ABNORMAL HIGH (ref 70–99)
Potassium: 4.6 mEq/L (ref 3.5–5.1)
Sodium: 139 mEq/L (ref 135–145)

## 2012-02-08 LAB — LIPASE, BLOOD: Lipase: 75 U/L — ABNORMAL HIGH (ref 11–59)

## 2012-02-08 LAB — TROPONIN I: Troponin I: <0.3 ng/mL (ref <0.30)

## 2012-02-08 MED ORDER — ASPIRIN 325 MG PO TABS
325.0000 mg | ORAL_TABLET | Freq: Once | ORAL | Status: AC
  Administered 2012-02-08: 325 mg via ORAL
  Filled 2012-02-08: qty 1

## 2012-02-08 NOTE — ED Provider Notes (Signed)
History     CSN: [id number]  Arrival date & time 02/08/12  1156   First MD Initiated Contact with Patient 02/08/12 1304      Chief Complaint  Patient presents with  . Chest Pain     HPI The pt reports multiple episodes of chest pain over the past week that are not constant. Last 5-15 minutes without SOB or diaphoresis. His wife reports decreased energy and some SOB. Denies fevers. Episode yesterday occurred after doing yard work and was not associated with food. todays episode occurred while eating breakfast and he thinks it was worse with swallowing. Hx of CABG and in 2009 a stent to his LAD. No heart cath since then. Hx of hiatal hernia. Wife reports his symptoms now are similar to before his CABG. None exertional per the pt. No melena. No hematochezia. No other complaints. No pain at this time.    Past Medical History  Diagnosis Date  . Coronary artery disease   . Hypertension   . Hyperlipemia   . Anginal pain 02/08/12  . Exertional dyspnea     "doesn't take much these days"  . Type II diabetes mellitus   . Hypothyroidism   . Stroke 09/2010    denies residual  . H/O hiatal hernia   . Arthritis     "all over"    Past Surgical History  Procedure Date  . Eye surgery   . Coronary artery bypass graft 1999    CABG X4  . Coronary angioplasty with stent placement ~ 2004    "1"  . Cataract extraction w/ intraocular lens  implant, bilateral ~ 2007  . Retinal detachment surgery 10/2011    right eye  . Colonoscopy w/ polypectomy 09/2011    "removed 7"    History reviewed. No pertinent family history.  History  Substance Use Topics  . Smoking status: Former Smoker -- 2.0 packs/day for 35 years    Types: Cigarettes    Quit date: 07/25/1993  . Smokeless tobacco: Never Used  . Alcohol Use: Yes     02/08/12 "stopped ~ 1995"      Review of Systems  All other systems reviewed and are negative.    Allergies  Review of patient's allergies indicates no known  allergies.  Home Medications   Current Outpatient Rx  Name Route Sig Dispense Refill  . AMLODIPINE BESYLATE 10 MG PO TABS Oral Take 10 mg by mouth daily.    . ASPIRIN EC 81 MG PO TBEC Oral Take 81 mg by mouth daily.    . ATENOLOL 25 MG PO TABS Oral Take 25 mg by mouth daily.    . ATORVASTATIN CALCIUM 20 MG PO TABS Oral Take 20 mg by mouth daily.    Marland Kitchen CLOPIDOGREL BISULFATE 75 MG PO TABS Oral Take 75 mg by mouth daily.    Marland Kitchen BENADRYL ALLERGY PO Oral Take 1 tablet by mouth daily as needed. For cold symptoms    . ENALAPRIL MALEATE 20 MG PO TABS Oral Take 20 mg by mouth 2 (two) times daily.    . FUROSEMIDE 40 MG PO TABS Oral Take 20-40 mg by mouth 2 (two) times daily.    . INSULIN DETEMIR 100 UNIT/ML [place] SOLN Subcutaneous Inject 50 Units into the skin 2 (two) times daily.    . INSULIN LISPRO (HUMAN) 100 UNIT/ML  [place] SOLN Subcutaneous Inject 20 Units into the skin 3 (three) times daily before meals.    Marland Kitchen LEVOTHYROXINE SODIUM 75 MCG PO TABS Oral Take  75 mcg by mouth daily.    Marland Kitchen VICTOZA [place] Subcutaneous Inject 1.8 Units into the skin every morning.      BP 154/67  Pulse 71  Resp 23  SpO2 97%  Physical Exam  Nursing note and vitals reviewed. Constitutional: He is oriented to person, place, and time. He appears well-developed and well-nourished.  HENT:  Head: Normocephalic and atraumatic.  Eyes: EOM are normal.  Neck: Normal range of motion.  Cardiovascular: Normal rate, regular rhythm, normal heart sounds and intact distal pulses.   Pulmonary/Chest: Effort normal and breath sounds normal. No respiratory distress.  Abdominal: Soft. He exhibits no distension. There is no tenderness.  Musculoskeletal: Normal range of motion.  Neurological: He is alert and oriented to person, place, and time.  Skin: Skin is warm and dry.  Psychiatric: He has a normal mood and affect. Judgment normal.    ED Course  Procedures (including critical care time)   Date: 02/09/2012  Rate: 81  Rhythm: normal sinus  rhythm  QRS Axis: normal  Intervals: normal  ST/T Wave abnormalities: none specific lateral ST changes  Conduction Disutrbances: none  Narrative Interpretation:   Old EKG Reviewed: nonspecific ST changes as compared to prior in lateral leads     Labs Reviewed  BASIC METABOLIC PANEL - Abnormal; Notable for the following:    Glucose, Bld 147 (*)     BUN 24 (*)     Creatinine, Ser 1.53 (*)     GFR calc non Af Amer 42 (*)     GFR calc Af Amer 49 (*)     All other components within normal limits  LIPASE, BLOOD - Abnormal; Notable for the following:    Lipase 75 (*)     All other components within normal limits  CBC  POCT I-STAT TROPONIN I  TROPONIN I   Dg Chest 2 View  02/08/2012  *RADIOLOGY REPORT*  Clinical Data: 78 year old male with epigastric pain and difficulty swallowing.  CHEST - 2 VIEW  Comparison: 09/21/2009.  Findings: Stable sequelae of CABG.  Cardiac size and mediastinal contours are within normal limits.  Normal lung volumes.  No pneumothorax, pulmonary edema or pleural effusion.  No consolidation.  Stable mild apical scarring. No acute osseous abnormality identified.  IMPRESSION: No acute cardiopulmonary abnormality.  Original Report Authenticated By: Randall An, M.D.    I personally reviewed the imaging tests through PACS system  I reviewed available ER/hospitalization records thought the EMR   1. Chest pain       MDM  He has both typical and atypical components to his chest pain. Given the severity of his pain and the potential to this to represent cardiac etiology I've asked his cardiologist to evaluate him in the ER. Some of his symptoms sound GI related.     5:06 PM The patient was seen by his cardiologist and this was thought to represent more of a GI/esophageal issue.  The patient will followup with his gastroenterologist.  The patient understands to return the emergency apartment for any new or worsening symptoms    Hoy Morn, MD 02/09/12  [phone]

## 2012-02-08 NOTE — ED Notes (Signed)
Family at bedside. 

## 2012-02-08 NOTE — ED Notes (Signed)
Patient reports he is having chest pain for 3 to 4 days.  Patient states he has increased pain when eating.  Patient states it feels raw.  Patient states he has some dizziness

## 2012-02-08 NOTE — Consult Note (Signed)
Admit date: 02/08/2012 Referring Physician  Dr. Jola Schmidt Primary Physician Dr. Gwenyth Allegra Primary Cardiologist  Irish Lack Reason for Consultation  chest discomfort  HPI: 78 year old man who has had coronary artery disease for many years.  He had bypass surgery in the late 90s.  In 2009, he had a drug-eluting stent to the LAD.  Most recently, he had fluid overload a few months ago.  He has been diuresis and has lost about 30 pounds.  He had a stress test in April 2013 which showed normal left ventricular function and a small apical lateral reversible defect.  Due to the fact that his symptoms resolved after diuresis, he has been managed medically.  Over the past few weeks, he has noticed that he gets a discomfort in his chest after eating or drinking certain foods.  It can happen with liquids as well.  Today he drank some water in the morning and this caused a burning that he felt go down from his esophagus into his stomach.  It only lasts less than a minute.  It does not come on with activity.  He again felt this in the emergency room and he was given an aspirin to swallow.  There is no associated nausea, diaphoresis, palpitations, syncope, lightheadedness.  Currently, he is pain-free.  He states that the symptoms he has now are different to what he had prior to his bypass surgery.     PMH:   Past Medical History  Diagnosis Date  . Coronary artery disease   . Hypertension   . Hyperlipemia   . Anginal pain 02/08/12  . Exertional dyspnea     "doesn't take much these days"  . Type II diabetes mellitus   . Hypothyroidism   . Stroke 09/2010    denies residual  . H/O hiatal hernia   . Arthritis     "all over"     PSH:   Past Surgical History  Procedure Date  . Eye surgery   . Coronary artery bypass graft 1999    CABG X4  . Coronary angioplasty with stent placement ~ 2004    "1"  . Cataract extraction w/ intraocular lens  implant, bilateral ~ 2007  . Retinal detachment surgery 10/2011      right eye  . Colonoscopy w/ polypectomy 09/2011    "removed 7"    Allergies:  Review of patient's allergies indicates no known allergies. Prior to Admit Meds:   (Not in a hospital admission) Fam HX:   History reviewed. No pertinent family history. Social HX:    History   Social History  . Marital Status: Married    Spouse Name: N/A    Number of Children: N/A  . Years of Education: N/A   Occupational History  . Not on file.   Social History Main Topics  . Smoking status: Former Smoker -- 2.0 packs/day for 35 years    Types: Cigarettes    Quit date: 07/25/1993  . Smokeless tobacco: Never Used  . Alcohol Use: Yes     02/08/12 "stopped ~ 1995"  . Drug Use: No  . Sexually Active: No   Other Topics Concern  . Not on file   Social History Narrative  . No narrative on file     ROS:  All 11 ROS were addressed and are negative except what is stated in the HPI  Physical Exam: Blood pressure 144/61, pulse 73, resp. rate 23, SpO2 95.00%.    General: Well developed, well nourished, in no  acute distress Head:    Normal cephalic and atramatic  Lungs:   Clear bilaterally to auscultation and percussion. Heart:   HRRR S1 S2 Abdomen:  abdomen soft and non-tender  Msk:   Normal strength and tone for age. Extremities:  1+ edema in bilateral lower extremities Neuro: Alert and oriented X 3. Psych:  Good affect, responds appropriately    Labs:   Lab Results  Component Value Date   WBC 5.8 02/08/2012   HGB 14.2 02/08/2012   HCT 41.5 02/08/2012   MCV 89.2 02/08/2012   PLT 192 02/08/2012    Lab 02/08/12 1309  NA 139  K 4.6  CL 100  CO2 28  BUN 24*  CREATININE 1.53*  CALCIUM 9.6  PROT --  BILITOT --  ALKPHOS --  ALT --  AST --  GLUCOSE 147*   No results found for this basename: PTT   No results found for this basename: INR, PROTIME   Lab Results  Component Value Date   CKTOTAL 328* 09/22/2009   CKMB 2.0 09/22/2009   TROPONINI <0.30 02/08/2012     No results found  for this basename: CHOL   No results found for this basename: HDL   No results found for this basename: LDLCALC   No results found for this basename: TRIG   No results found for this basename: CHOLHDL   No results found for this basename: LDLDIRECT      Radiology:  Dg Chest 2 View  02/08/2012  *RADIOLOGY REPORT*  Clinical Data: 78 year old male with epigastric pain and difficulty swallowing.  CHEST - 2 VIEW  Comparison: 09/21/2009.  Findings: Stable sequelae of CABG.  Cardiac size and mediastinal contours are within normal limits.  Normal lung volumes.  No pneumothorax, pulmonary edema or pleural effusion.  No consolidation.  Stable mild apical scarring. No acute osseous abnormality identified.  IMPRESSION: No acute cardiopulmonary abnormality.  Original Report Authenticated By: Joni Fears III, M.D.    EKG:  Normal sinus rhythm, nonspecific ST segment changes.  No change when compared to ECG from 2009.  ASSESSMENT: Atypical chest pain, coronary artery disease  PLAN:   #1) given that he has had a recent stress test, the only further evaluation for coronary artery disease would be catheterization.  His history is not consistent with angina.  It sounds more like a GI etiology for his symptoms.  There is no objective evidence for acute ischemia here in the emergency room.  He is now symptom-free.  We discussed the risks and benefits of further workup.  The patient and his family are all in agreement that they would like to pursue GI workup.  I think is very reasonable.  Even if there is a small vessel which could be revascularized which caused a small defect on the stress test, I don't think it would improve the symptoms that he came with to the emergency room today.  Committing him to long-term Plavix would also preclude any other type of invasive procedures.  2) I did recommend that he exercise more.  He should walk 30 minutes a day 5 days a week.  If he has any exertional symptoms, he'll let us  know and we will  Pursue further cardiac testing.  3) he is also comfortable not spending the night in the hospital.  He will return if he has any further symptoms.  4) continue current medical treatment for his coronary artery disease.  However followup with him in the office in a few weeks.  These recommendations were discussed with Dr. Venora Maples. Jettie Booze., MD  02/08/2012  5:20 PM

## 2012-02-08 NOTE — ED Notes (Signed)
MD at bedside. 

## 2012-02-09 ENCOUNTER — Other Ambulatory Visit: Payer: Self-pay | Admitting: Gastroenterology

## 2012-02-09 DIAGNOSIS — R1310 Dysphagia, unspecified: Secondary | ICD-10-CM

## 2012-02-10 ENCOUNTER — Ambulatory Visit (HOSPITAL_COMMUNITY)
Admission: RE | Admit: 2012-02-10 | Discharge: 2012-02-10 | Disposition: A | Discharge status: Home / Self Care | Payer: Medicare Other | Source: Ambulatory Visit | Attending: Gastroenterology | Admitting: Gastroenterology

## 2012-02-10 DIAGNOSIS — K449 Diaphragmatic hernia without obstruction or gangrene: Secondary | ICD-10-CM | POA: Diagnosis not present

## 2012-02-10 DIAGNOSIS — R1310 Dysphagia, unspecified: Secondary | ICD-10-CM | POA: Insufficient documentation

## 2012-02-10 DIAGNOSIS — K224 Dyskinesia of esophagus: Secondary | ICD-10-CM | POA: Diagnosis not present

## 2012-02-16 DIAGNOSIS — E1129 Type 2 diabetes mellitus with other diabetic kidney complication: Secondary | ICD-10-CM | POA: Diagnosis not present

## 2012-02-16 DIAGNOSIS — E039 Hypothyroidism, unspecified: Secondary | ICD-10-CM | POA: Diagnosis not present

## 2012-02-16 DIAGNOSIS — N183 Chronic kidney disease, stage 3 unspecified: Secondary | ICD-10-CM | POA: Diagnosis not present

## 2012-02-16 DIAGNOSIS — I10 Essential (primary) hypertension: Secondary | ICD-10-CM | POA: Diagnosis not present

## 2012-02-16 DIAGNOSIS — E785 Hyperlipidemia, unspecified: Secondary | ICD-10-CM | POA: Diagnosis not present

## 2012-02-16 DIAGNOSIS — Z794 Long term (current) use of insulin: Secondary | ICD-10-CM | POA: Diagnosis not present

## 2012-02-16 DIAGNOSIS — R7309 Other abnormal glucose: Secondary | ICD-10-CM | POA: Diagnosis not present

## 2012-02-16 DIAGNOSIS — E1165 Type 2 diabetes mellitus with hyperglycemia: Secondary | ICD-10-CM | POA: Diagnosis not present

## 2012-03-08 DIAGNOSIS — K449 Diaphragmatic hernia without obstruction or gangrene: Secondary | ICD-10-CM | POA: Diagnosis not present

## 2012-03-08 DIAGNOSIS — K5900 Constipation, unspecified: Secondary | ICD-10-CM | POA: Diagnosis not present

## 2012-03-08 DIAGNOSIS — R1319 Other dysphagia: Secondary | ICD-10-CM | POA: Diagnosis not present

## 2012-03-08 DIAGNOSIS — K219 Gastro-esophageal reflux disease without esophagitis: Secondary | ICD-10-CM | POA: Diagnosis not present

## 2012-03-15 DIAGNOSIS — E11349 Type 2 diabetes mellitus with severe nonproliferative diabetic retinopathy without macular edema: Secondary | ICD-10-CM | POA: Diagnosis not present

## 2012-03-15 DIAGNOSIS — H35049 Retinal micro-aneurysms, unspecified, unspecified eye: Secondary | ICD-10-CM | POA: Diagnosis not present

## 2012-03-15 DIAGNOSIS — E1139 Type 2 diabetes mellitus with other diabetic ophthalmic complication: Secondary | ICD-10-CM | POA: Diagnosis not present

## 2012-03-20 DIAGNOSIS — E1139 Type 2 diabetes mellitus with other diabetic ophthalmic complication: Secondary | ICD-10-CM | POA: Diagnosis not present

## 2012-03-20 DIAGNOSIS — E11349 Type 2 diabetes mellitus with severe nonproliferative diabetic retinopathy without macular edema: Secondary | ICD-10-CM | POA: Diagnosis not present

## 2012-03-29 DIAGNOSIS — I10 Essential (primary) hypertension: Secondary | ICD-10-CM | POA: Diagnosis not present

## 2012-03-29 DIAGNOSIS — Z23 Encounter for immunization: Secondary | ICD-10-CM | POA: Diagnosis not present

## 2012-03-29 DIAGNOSIS — E039 Hypothyroidism, unspecified: Secondary | ICD-10-CM | POA: Diagnosis not present

## 2012-03-29 DIAGNOSIS — N183 Chronic kidney disease, stage 3 unspecified: Secondary | ICD-10-CM | POA: Diagnosis not present

## 2012-03-29 DIAGNOSIS — R7309 Other abnormal glucose: Secondary | ICD-10-CM | POA: Diagnosis not present

## 2012-03-29 DIAGNOSIS — E1129 Type 2 diabetes mellitus with other diabetic kidney complication: Secondary | ICD-10-CM | POA: Diagnosis not present

## 2012-03-29 DIAGNOSIS — Z794 Long term (current) use of insulin: Secondary | ICD-10-CM | POA: Diagnosis not present

## 2012-03-29 DIAGNOSIS — E785 Hyperlipidemia, unspecified: Secondary | ICD-10-CM | POA: Diagnosis not present

## 2012-05-10 DIAGNOSIS — E039 Hypothyroidism, unspecified: Secondary | ICD-10-CM | POA: Diagnosis not present

## 2012-05-10 DIAGNOSIS — E1129 Type 2 diabetes mellitus with other diabetic kidney complication: Secondary | ICD-10-CM | POA: Diagnosis not present

## 2012-05-10 DIAGNOSIS — I10 Essential (primary) hypertension: Secondary | ICD-10-CM | POA: Diagnosis not present

## 2012-05-10 DIAGNOSIS — Z794 Long term (current) use of insulin: Secondary | ICD-10-CM | POA: Diagnosis not present

## 2012-05-10 DIAGNOSIS — E785 Hyperlipidemia, unspecified: Secondary | ICD-10-CM | POA: Diagnosis not present

## 2012-05-10 DIAGNOSIS — R7309 Other abnormal glucose: Secondary | ICD-10-CM | POA: Diagnosis not present

## 2012-05-10 DIAGNOSIS — N183 Chronic kidney disease, stage 3 unspecified: Secondary | ICD-10-CM | POA: Diagnosis not present

## 2012-05-10 DIAGNOSIS — E1165 Type 2 diabetes mellitus with hyperglycemia: Secondary | ICD-10-CM | POA: Diagnosis not present

## 2012-06-20 DIAGNOSIS — R7309 Other abnormal glucose: Secondary | ICD-10-CM | POA: Diagnosis not present

## 2012-06-20 DIAGNOSIS — E1129 Type 2 diabetes mellitus with other diabetic kidney complication: Secondary | ICD-10-CM | POA: Diagnosis not present

## 2012-06-20 DIAGNOSIS — E1165 Type 2 diabetes mellitus with hyperglycemia: Secondary | ICD-10-CM | POA: Diagnosis not present

## 2012-06-20 DIAGNOSIS — Z794 Long term (current) use of insulin: Secondary | ICD-10-CM | POA: Diagnosis not present

## 2012-06-20 DIAGNOSIS — I10 Essential (primary) hypertension: Secondary | ICD-10-CM | POA: Diagnosis not present

## 2012-06-20 DIAGNOSIS — E039 Hypothyroidism, unspecified: Secondary | ICD-10-CM | POA: Diagnosis not present

## 2012-06-20 DIAGNOSIS — N183 Chronic kidney disease, stage 3 unspecified: Secondary | ICD-10-CM | POA: Diagnosis not present

## 2012-06-20 DIAGNOSIS — E785 Hyperlipidemia, unspecified: Secondary | ICD-10-CM | POA: Diagnosis not present

## 2012-07-26 DIAGNOSIS — H43829 Vitreomacular adhesion, unspecified eye: Secondary | ICD-10-CM | POA: Diagnosis not present

## 2012-07-26 DIAGNOSIS — E11359 Type 2 diabetes mellitus with proliferative diabetic retinopathy without macular edema: Secondary | ICD-10-CM | POA: Diagnosis not present

## 2012-07-26 DIAGNOSIS — E11349 Type 2 diabetes mellitus with severe nonproliferative diabetic retinopathy without macular edema: Secondary | ICD-10-CM | POA: Diagnosis not present

## 2012-07-26 DIAGNOSIS — E1139 Type 2 diabetes mellitus with other diabetic ophthalmic complication: Secondary | ICD-10-CM | POA: Diagnosis not present

## 2012-07-26 DIAGNOSIS — H3560 Retinal hemorrhage, unspecified eye: Secondary | ICD-10-CM | POA: Diagnosis not present

## 2012-07-30 DIAGNOSIS — E785 Hyperlipidemia, unspecified: Secondary | ICD-10-CM | POA: Diagnosis not present

## 2012-07-30 DIAGNOSIS — R7309 Other abnormal glucose: Secondary | ICD-10-CM | POA: Diagnosis not present

## 2012-07-30 DIAGNOSIS — I10 Essential (primary) hypertension: Secondary | ICD-10-CM | POA: Diagnosis not present

## 2012-07-30 DIAGNOSIS — E1165 Type 2 diabetes mellitus with hyperglycemia: Secondary | ICD-10-CM | POA: Diagnosis not present

## 2012-07-30 DIAGNOSIS — E039 Hypothyroidism, unspecified: Secondary | ICD-10-CM | POA: Diagnosis not present

## 2012-07-30 DIAGNOSIS — Z794 Long term (current) use of insulin: Secondary | ICD-10-CM | POA: Diagnosis not present

## 2012-07-30 DIAGNOSIS — N183 Chronic kidney disease, stage 3 unspecified: Secondary | ICD-10-CM | POA: Diagnosis not present

## 2012-07-30 DIAGNOSIS — E1129 Type 2 diabetes mellitus with other diabetic kidney complication: Secondary | ICD-10-CM | POA: Diagnosis not present

## 2012-08-14 DIAGNOSIS — E039 Hypothyroidism, unspecified: Secondary | ICD-10-CM | POA: Diagnosis not present

## 2012-08-14 DIAGNOSIS — I10 Essential (primary) hypertension: Secondary | ICD-10-CM | POA: Diagnosis not present

## 2012-08-14 DIAGNOSIS — E1165 Type 2 diabetes mellitus with hyperglycemia: Secondary | ICD-10-CM | POA: Diagnosis not present

## 2012-08-14 DIAGNOSIS — E1129 Type 2 diabetes mellitus with other diabetic kidney complication: Secondary | ICD-10-CM | POA: Diagnosis not present

## 2012-08-14 DIAGNOSIS — E785 Hyperlipidemia, unspecified: Secondary | ICD-10-CM | POA: Diagnosis not present

## 2012-08-24 DIAGNOSIS — E119 Type 2 diabetes mellitus without complications: Secondary | ICD-10-CM | POA: Diagnosis not present

## 2012-08-24 DIAGNOSIS — D649 Anemia, unspecified: Secondary | ICD-10-CM | POA: Diagnosis not present

## 2012-08-24 DIAGNOSIS — E785 Hyperlipidemia, unspecified: Secondary | ICD-10-CM | POA: Diagnosis not present

## 2012-08-24 DIAGNOSIS — N183 Chronic kidney disease, stage 3 unspecified: Secondary | ICD-10-CM | POA: Diagnosis not present

## 2012-08-24 DIAGNOSIS — E213 Hyperparathyroidism, unspecified: Secondary | ICD-10-CM | POA: Diagnosis not present

## 2012-08-24 DIAGNOSIS — N2581 Secondary hyperparathyroidism of renal origin: Secondary | ICD-10-CM | POA: Diagnosis not present

## 2012-09-10 DIAGNOSIS — Z794 Long term (current) use of insulin: Secondary | ICD-10-CM | POA: Diagnosis not present

## 2012-09-10 DIAGNOSIS — E1129 Type 2 diabetes mellitus with other diabetic kidney complication: Secondary | ICD-10-CM | POA: Diagnosis not present

## 2012-09-10 DIAGNOSIS — N183 Chronic kidney disease, stage 3 unspecified: Secondary | ICD-10-CM | POA: Diagnosis not present

## 2012-09-10 DIAGNOSIS — E785 Hyperlipidemia, unspecified: Secondary | ICD-10-CM | POA: Diagnosis not present

## 2012-09-10 DIAGNOSIS — I10 Essential (primary) hypertension: Secondary | ICD-10-CM | POA: Diagnosis not present

## 2012-09-10 DIAGNOSIS — R7309 Other abnormal glucose: Secondary | ICD-10-CM | POA: Diagnosis not present

## 2012-09-10 DIAGNOSIS — E039 Hypothyroidism, unspecified: Secondary | ICD-10-CM | POA: Diagnosis not present

## 2012-09-27 DIAGNOSIS — N183 Chronic kidney disease, stage 3 unspecified: Secondary | ICD-10-CM | POA: Diagnosis not present

## 2012-09-27 DIAGNOSIS — I120 Hypertensive chronic kidney disease with stage 5 chronic kidney disease or end stage renal disease: Secondary | ICD-10-CM | POA: Diagnosis not present

## 2012-10-09 DIAGNOSIS — Z1211 Encounter for screening for malignant neoplasm of colon: Secondary | ICD-10-CM | POA: Diagnosis not present

## 2012-10-09 DIAGNOSIS — K219 Gastro-esophageal reflux disease without esophagitis: Secondary | ICD-10-CM | POA: Diagnosis not present

## 2012-10-09 DIAGNOSIS — Z86010 Personal history of colonic polyps: Secondary | ICD-10-CM | POA: Diagnosis not present

## 2012-10-22 DIAGNOSIS — E1129 Type 2 diabetes mellitus with other diabetic kidney complication: Secondary | ICD-10-CM | POA: Diagnosis not present

## 2012-10-22 DIAGNOSIS — E785 Hyperlipidemia, unspecified: Secondary | ICD-10-CM | POA: Diagnosis not present

## 2012-10-22 DIAGNOSIS — E039 Hypothyroidism, unspecified: Secondary | ICD-10-CM | POA: Diagnosis not present

## 2012-10-22 DIAGNOSIS — R7309 Other abnormal glucose: Secondary | ICD-10-CM | POA: Diagnosis not present

## 2012-10-22 DIAGNOSIS — I10 Essential (primary) hypertension: Secondary | ICD-10-CM | POA: Diagnosis not present

## 2012-10-22 DIAGNOSIS — Z794 Long term (current) use of insulin: Secondary | ICD-10-CM | POA: Diagnosis not present

## 2012-10-22 DIAGNOSIS — N183 Chronic kidney disease, stage 3 unspecified: Secondary | ICD-10-CM | POA: Diagnosis not present

## 2012-10-22 DIAGNOSIS — E1165 Type 2 diabetes mellitus with hyperglycemia: Secondary | ICD-10-CM | POA: Diagnosis not present

## 2012-10-24 DIAGNOSIS — E785 Hyperlipidemia, unspecified: Secondary | ICD-10-CM | POA: Diagnosis not present

## 2012-10-24 DIAGNOSIS — I10 Essential (primary) hypertension: Secondary | ICD-10-CM | POA: Diagnosis not present

## 2012-10-24 DIAGNOSIS — N183 Chronic kidney disease, stage 3 unspecified: Secondary | ICD-10-CM | POA: Diagnosis not present

## 2012-10-24 DIAGNOSIS — E119 Type 2 diabetes mellitus without complications: Secondary | ICD-10-CM | POA: Diagnosis not present

## 2012-10-24 DIAGNOSIS — I129 Hypertensive chronic kidney disease with stage 1 through stage 4 chronic kidney disease, or unspecified chronic kidney disease: Secondary | ICD-10-CM | POA: Diagnosis not present

## 2012-12-07 DIAGNOSIS — R9430 Abnormal result of cardiovascular function study, unspecified: Secondary | ICD-10-CM | POA: Diagnosis not present

## 2012-12-07 DIAGNOSIS — I5032 Chronic diastolic (congestive) heart failure: Secondary | ICD-10-CM | POA: Diagnosis not present

## 2012-12-07 DIAGNOSIS — E7800 Pure hypercholesterolemia, unspecified: Secondary | ICD-10-CM | POA: Diagnosis not present

## 2012-12-07 DIAGNOSIS — I2510 Atherosclerotic heart disease of native coronary artery without angina pectoris: Secondary | ICD-10-CM | POA: Diagnosis not present

## 2012-12-19 DIAGNOSIS — E1129 Type 2 diabetes mellitus with other diabetic kidney complication: Secondary | ICD-10-CM | POA: Diagnosis not present

## 2012-12-19 DIAGNOSIS — Z794 Long term (current) use of insulin: Secondary | ICD-10-CM | POA: Diagnosis not present

## 2012-12-19 DIAGNOSIS — E785 Hyperlipidemia, unspecified: Secondary | ICD-10-CM | POA: Diagnosis not present

## 2012-12-19 DIAGNOSIS — E039 Hypothyroidism, unspecified: Secondary | ICD-10-CM | POA: Diagnosis not present

## 2012-12-19 DIAGNOSIS — E8881 Metabolic syndrome: Secondary | ICD-10-CM | POA: Diagnosis not present

## 2012-12-19 DIAGNOSIS — N183 Chronic kidney disease, stage 3 unspecified: Secondary | ICD-10-CM | POA: Diagnosis not present

## 2012-12-19 DIAGNOSIS — E1165 Type 2 diabetes mellitus with hyperglycemia: Secondary | ICD-10-CM | POA: Diagnosis not present

## 2012-12-19 DIAGNOSIS — I10 Essential (primary) hypertension: Secondary | ICD-10-CM | POA: Diagnosis not present

## 2012-12-31 DIAGNOSIS — H43829 Vitreomacular adhesion, unspecified eye: Secondary | ICD-10-CM | POA: Diagnosis not present

## 2012-12-31 DIAGNOSIS — H35379 Puckering of macula, unspecified eye: Secondary | ICD-10-CM | POA: Diagnosis not present

## 2012-12-31 DIAGNOSIS — E11359 Type 2 diabetes mellitus with proliferative diabetic retinopathy without macular edema: Secondary | ICD-10-CM | POA: Diagnosis not present

## 2012-12-31 DIAGNOSIS — E11349 Type 2 diabetes mellitus with severe nonproliferative diabetic retinopathy without macular edema: Secondary | ICD-10-CM | POA: Diagnosis not present

## 2012-12-31 DIAGNOSIS — E1139 Type 2 diabetes mellitus with other diabetic ophthalmic complication: Secondary | ICD-10-CM | POA: Diagnosis not present

## 2013-01-30 IMAGING — RF DG ESOPHAGUS
15 of 24 series · 15 of 24 positions shown · non-contrast
Comparison: None.

CLINICAL DATA: Pain while eating.

ESOPHOGRAM/BARIUM SWALLOW
TECHNIQUE: Combined double contrast and single contrast
examination performed using effervescent crystals, thick barium
liquid, and thin barium liquid.
Fluoroscopy time:  2.37 minutes.

[Series 1: run · 1 of 1 slices shown (1 of 15)]
[im 1/1]
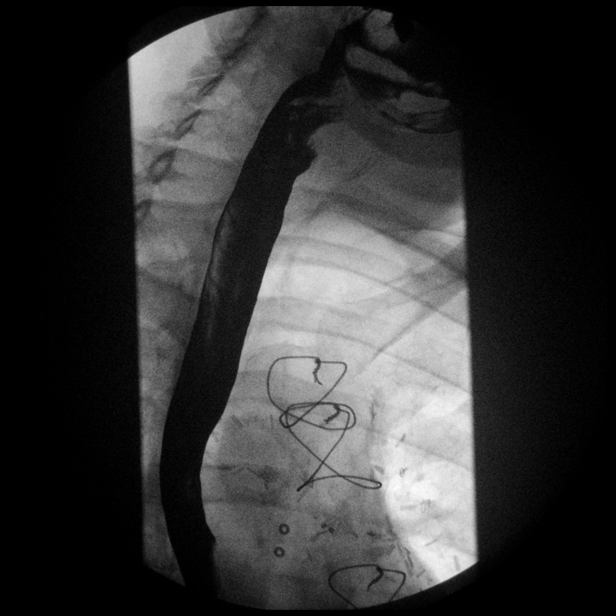

[Series 3: run · 1 of 1 slices shown (2 of 15)]
[im 1/1]
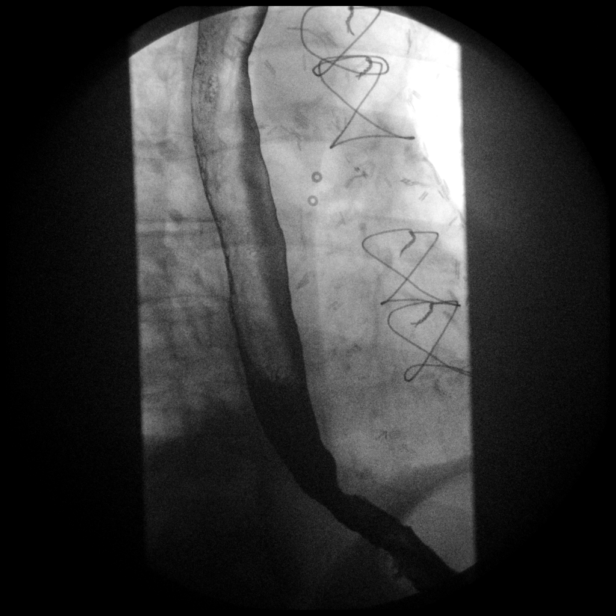

[Series 5: run · 1 of 1 slices shown (3 of 15)]
[im 1/1]
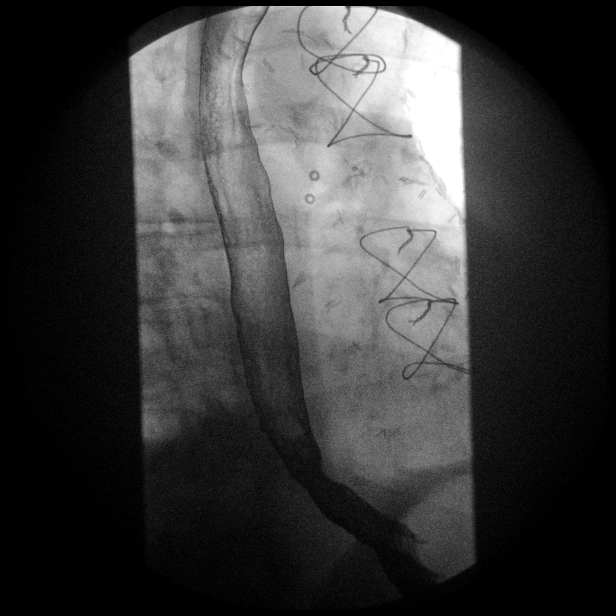

[Series 6: run · 1 of 1 slices shown (4 of 15)]
[im 1/1]
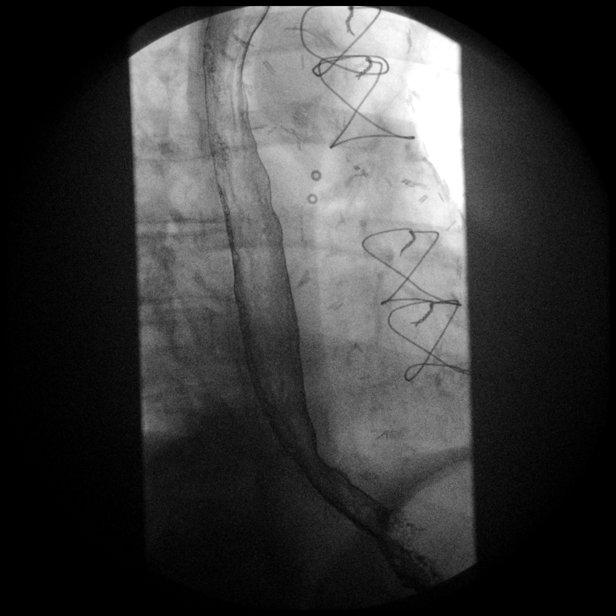

[Series 8: run · 1 of 1 slices shown (5 of 15)]
[im 1/1]
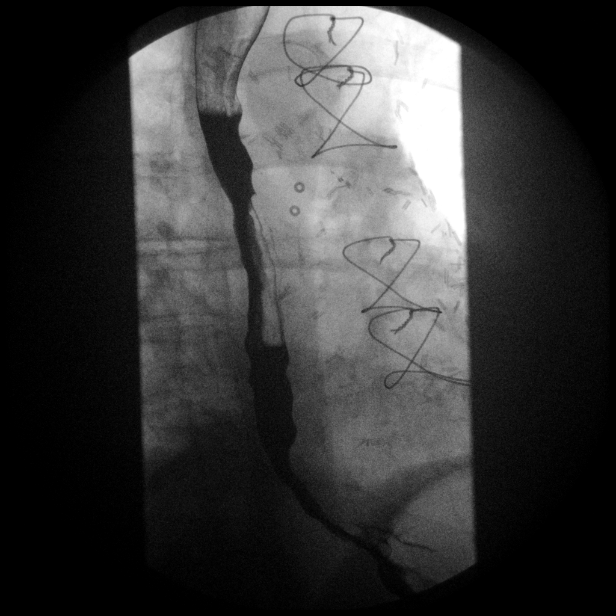

[Series 9: run · 1 of 1 slices shown (6 of 15)]
[im 1/1]
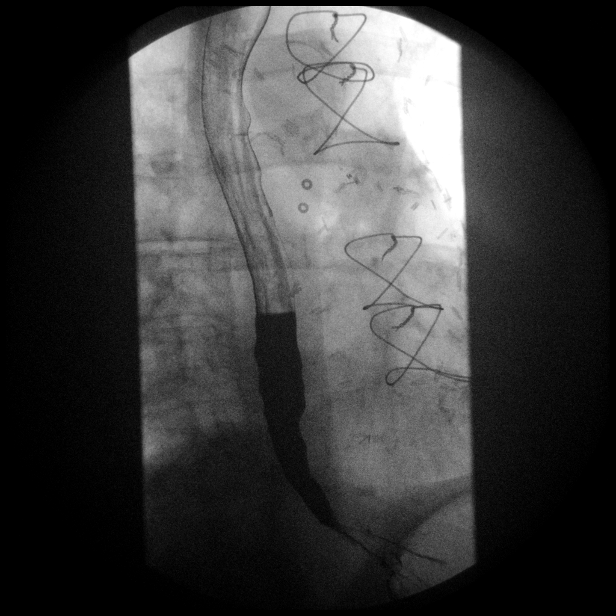

[Series 11: run · 1 of 1 slices shown (7 of 15)]
[im 1/1]
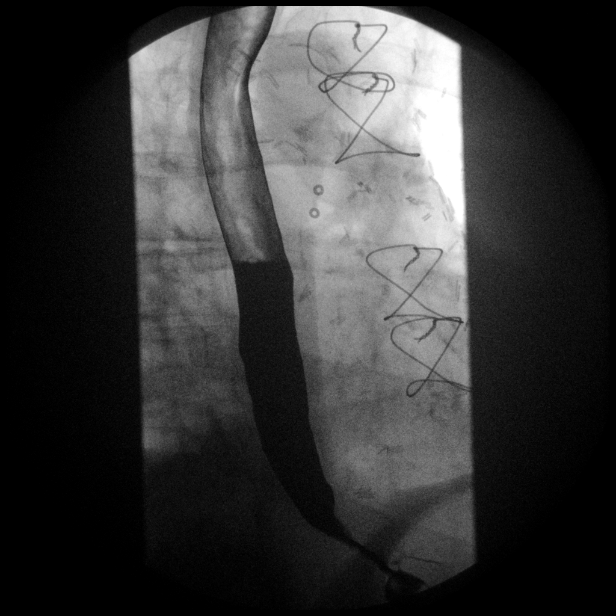

[Series 13: run · 1 of 1 slices shown (8 of 15)]
[im 1/1]
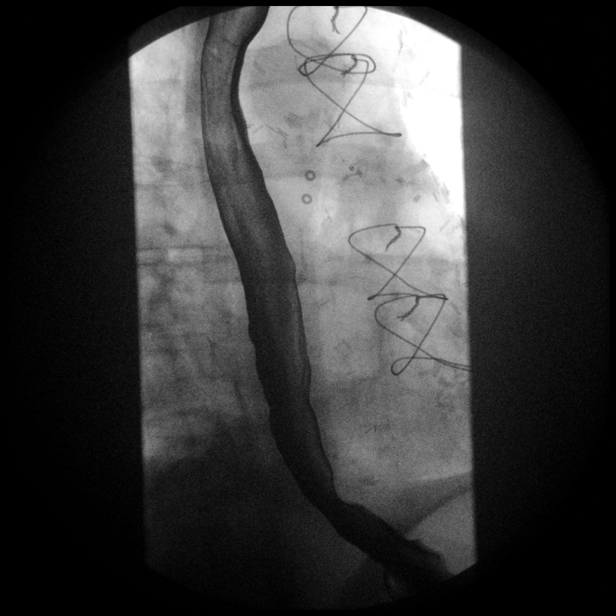

[Series 14: run · 1 of 1 slices shown (9 of 15)]
[im 1/1]
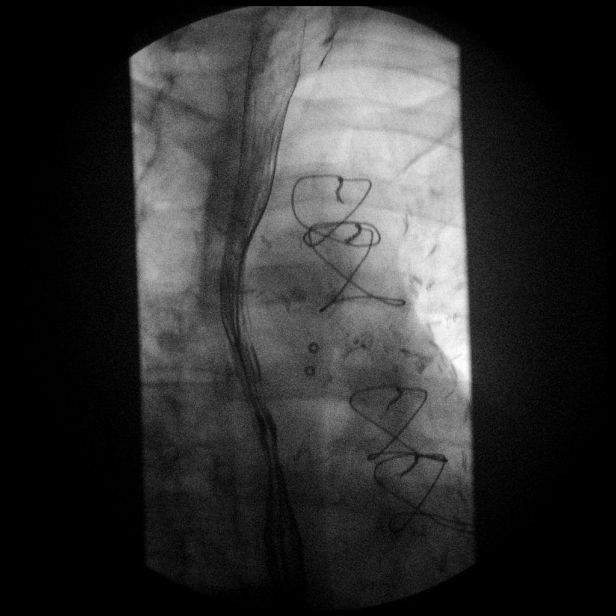

[Series 16: run · 1 of 1 slices shown (10 of 15)]
[im 1/1]
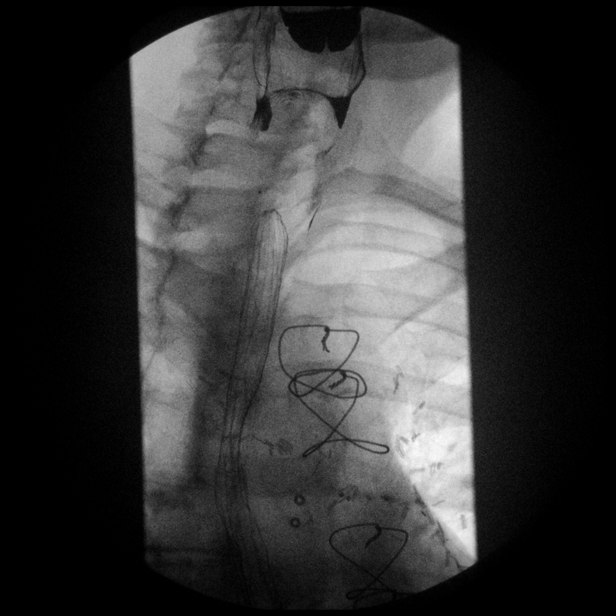

[Series 17: run · 1 of 1 slices shown (11 of 15)]
[im 1/1]
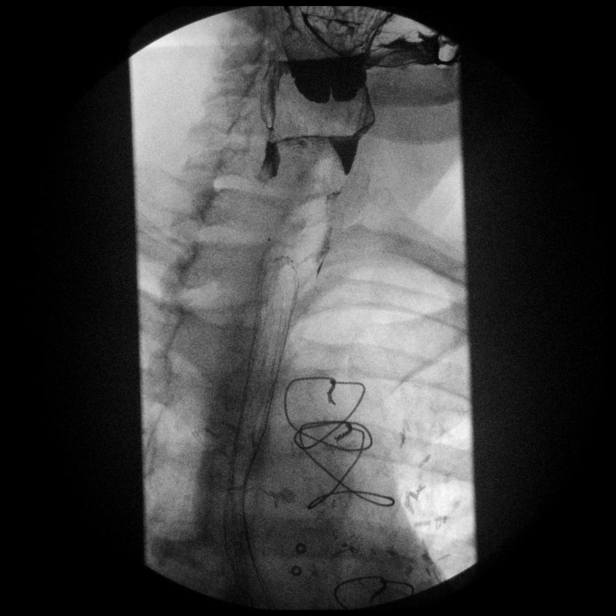

[Series 19: run · 1 of 1 slices shown (12 of 15)]
[im 1/1]
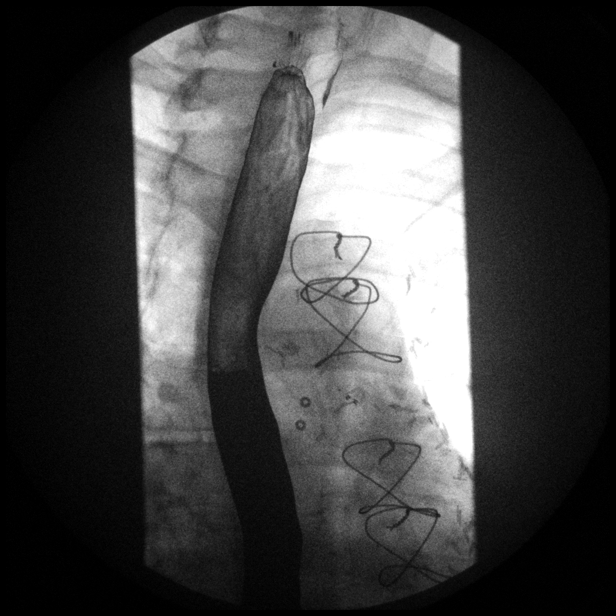

[Series 21: run · 1 of 1 slices shown (13 of 15)]
[im 1/1]
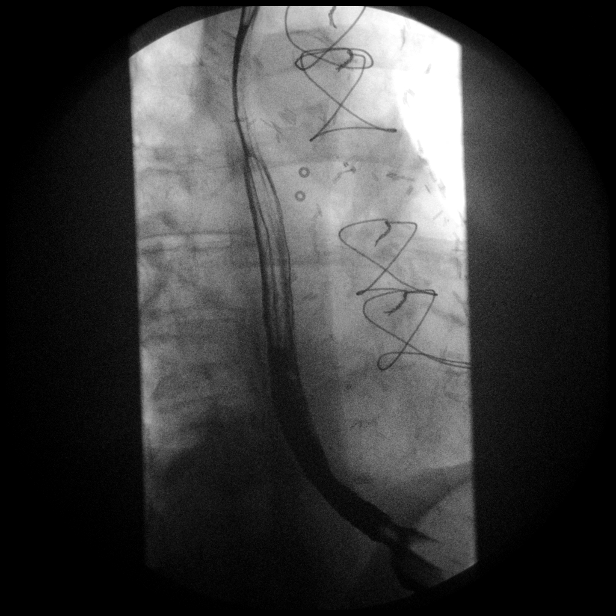

[Series 22: run · 1 of 1 slices shown (14 of 15)]
[im 1/1]
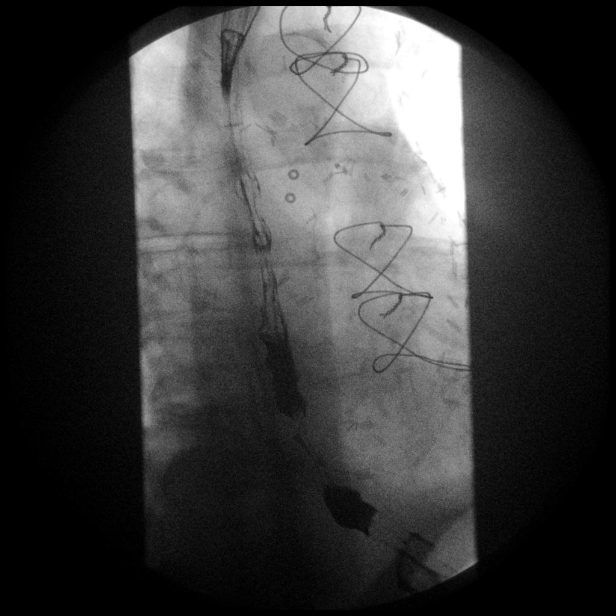

[Series 24: run · 1 of 1 slices shown (15 of 15)]
[im 1/1]
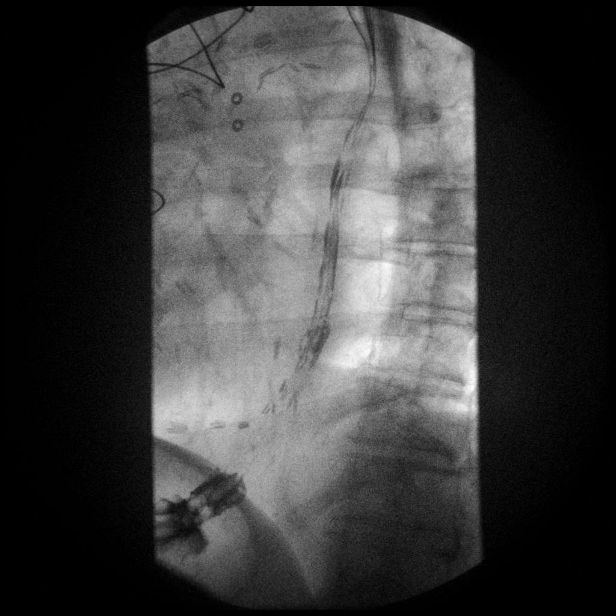

[15 of 24 positions shown; findings below may reference images not displayed]

FINDINGS: Fluoroscopic evaluation of swallowing demonstrates
disruption of all primary esophageal peristaltic waves.  Occasional
tertiary contractions noted.  No fixed stricture, fold thickening
or mass.  Small hiatal hernia.  No reflux with the water siphon
maneuver.  The patient swallowed a 13 mm barium tablet which freely
passed into the stomach.
IMPRESSION: Dysmotility.  Occasional tertiary contractions.

Small hiatal hernia.

## 2013-02-11 DIAGNOSIS — E039 Hypothyroidism, unspecified: Secondary | ICD-10-CM | POA: Diagnosis not present

## 2013-02-11 DIAGNOSIS — E1165 Type 2 diabetes mellitus with hyperglycemia: Secondary | ICD-10-CM | POA: Diagnosis not present

## 2013-02-11 DIAGNOSIS — I10 Essential (primary) hypertension: Secondary | ICD-10-CM | POA: Diagnosis not present

## 2013-02-11 DIAGNOSIS — E785 Hyperlipidemia, unspecified: Secondary | ICD-10-CM | POA: Diagnosis not present

## 2013-02-11 DIAGNOSIS — N183 Chronic kidney disease, stage 3 unspecified: Secondary | ICD-10-CM | POA: Diagnosis not present

## 2013-02-11 DIAGNOSIS — E1129 Type 2 diabetes mellitus with other diabetic kidney complication: Secondary | ICD-10-CM | POA: Diagnosis not present

## 2013-02-11 DIAGNOSIS — Z23 Encounter for immunization: Secondary | ICD-10-CM | POA: Diagnosis not present

## 2013-03-17 ENCOUNTER — Encounter (HOSPITAL_COMMUNITY): Payer: Self-pay | Admitting: Emergency Medicine

## 2013-03-17 ENCOUNTER — Inpatient Hospital Stay (HOSPITAL_COMMUNITY)
Admission: EM | Admit: 2013-03-17 | Discharge: 2013-03-20 | DRG: 683 | Disposition: A | Discharge status: Home / Self Care | Payer: Medicare Other | Attending: Internal Medicine | Admitting: Internal Medicine

## 2013-03-17 DIAGNOSIS — E785 Hyperlipidemia, unspecified: Secondary | ICD-10-CM | POA: Diagnosis present

## 2013-03-17 DIAGNOSIS — I440 Atrioventricular block, first degree: Secondary | ICD-10-CM | POA: Diagnosis present

## 2013-03-17 DIAGNOSIS — Z8673 Personal history of transient ischemic attack (TIA), and cerebral infarction without residual deficits: Secondary | ICD-10-CM

## 2013-03-17 DIAGNOSIS — E119 Type 2 diabetes mellitus without complications: Secondary | ICD-10-CM

## 2013-03-17 DIAGNOSIS — R112 Nausea with vomiting, unspecified: Secondary | ICD-10-CM

## 2013-03-17 DIAGNOSIS — R197 Diarrhea, unspecified: Secondary | ICD-10-CM | POA: Diagnosis not present

## 2013-03-17 DIAGNOSIS — I959 Hypotension, unspecified: Secondary | ICD-10-CM | POA: Diagnosis present

## 2013-03-17 DIAGNOSIS — Z9861 Coronary angioplasty status: Secondary | ICD-10-CM | POA: Diagnosis not present

## 2013-03-17 DIAGNOSIS — N184 Chronic kidney disease, stage 4 (severe): Secondary | ICD-10-CM | POA: Diagnosis present

## 2013-03-17 DIAGNOSIS — N179 Acute kidney failure, unspecified: Secondary | ICD-10-CM | POA: Diagnosis not present

## 2013-03-17 DIAGNOSIS — Z8249 Family history of ischemic heart disease and other diseases of the circulatory system: Secondary | ICD-10-CM

## 2013-03-17 DIAGNOSIS — Z9849 Cataract extraction status, unspecified eye: Secondary | ICD-10-CM

## 2013-03-17 DIAGNOSIS — E039 Hypothyroidism, unspecified: Secondary | ICD-10-CM | POA: Diagnosis present

## 2013-03-17 DIAGNOSIS — Z79899 Other long term (current) drug therapy: Secondary | ICD-10-CM

## 2013-03-17 DIAGNOSIS — Z87891 Personal history of nicotine dependence: Secondary | ICD-10-CM | POA: Diagnosis not present

## 2013-03-17 DIAGNOSIS — Z794 Long term (current) use of insulin: Secondary | ICD-10-CM | POA: Diagnosis not present

## 2013-03-17 DIAGNOSIS — I129 Hypertensive chronic kidney disease with stage 1 through stage 4 chronic kidney disease, or unspecified chronic kidney disease: Secondary | ICD-10-CM | POA: Diagnosis present

## 2013-03-17 DIAGNOSIS — Z961 Presence of intraocular lens: Secondary | ICD-10-CM | POA: Diagnosis not present

## 2013-03-17 DIAGNOSIS — E860 Dehydration: Secondary | ICD-10-CM | POA: Diagnosis present

## 2013-03-17 DIAGNOSIS — Z951 Presence of aortocoronary bypass graft: Secondary | ICD-10-CM | POA: Diagnosis not present

## 2013-03-17 DIAGNOSIS — R42 Dizziness and giddiness: Secondary | ICD-10-CM

## 2013-03-17 DIAGNOSIS — I2510 Atherosclerotic heart disease of native coronary artery without angina pectoris: Secondary | ICD-10-CM | POA: Diagnosis present

## 2013-03-17 DIAGNOSIS — A080 Rotaviral enteritis: Secondary | ICD-10-CM | POA: Diagnosis present

## 2013-03-17 LAB — CBC WITH DIFFERENTIAL/PLATELET
Basophils Absolute: 0.1 10*3/uL (ref 0.0–0.1)
Basophils Relative: 1 % (ref 0–1)
Eosinophils Absolute: 0 10*3/uL (ref 0.0–0.7)
Eosinophils Relative: 0 % (ref 0–5)
HCT: 49.2 % (ref 39.0–52.0)
Hemoglobin: 17 g/dL (ref 13.0–17.0)
Lymphocytes Relative: 11 % — ABNORMAL LOW (ref 12–46)
Lymphs Abs: 1.1 10*3/uL (ref 0.7–4.0)
MCH: 31 pg (ref 26.0–34.0)
MCHC: 34.6 g/dL (ref 30.0–36.0)
MCV: 89.8 fL (ref 78.0–100.0)
Monocytes Absolute: 1.5 10*3/uL — ABNORMAL HIGH (ref 0.1–1.0)
Monocytes Relative: 15 % — ABNORMAL HIGH (ref 3–12)
Neutro Abs: 7 10*3/uL (ref 1.7–7.7)
Neutrophils Relative %: 73 % (ref 43–77)
Platelets: 222 10*3/uL (ref 150–400)
RBC: 5.48 MIL/uL (ref 4.22–5.81)
RDW: 13.7 % (ref 11.5–15.5)
WBC: 9.7 10*3/uL (ref 4.0–10.5)

## 2013-03-17 LAB — COMPREHENSIVE METABOLIC PANEL
ALT: 31 U/L (ref 0–53)
AST: 36 U/L (ref 0–37)
Albumin: 4.6 g/dL (ref 3.5–5.2)
Alkaline Phosphatase: 76 U/L (ref 39–117)
BUN: 38 mg/dL — ABNORMAL HIGH (ref 6–23)
CO2: 15 mEq/L — ABNORMAL LOW (ref 19–32)
Calcium: 9.4 mg/dL (ref 8.4–10.5)
Chloride: 101 mEq/L (ref 96–112)
Creatinine, Ser: 4.15 mg/dL — ABNORMAL HIGH (ref 0.50–1.35)
GFR calc Af Amer: 15 mL/min — ABNORMAL LOW (ref >90)
GFR calc non Af Amer: 13 mL/min — ABNORMAL LOW (ref >90)
Glucose, Bld: 175 mg/dL — ABNORMAL HIGH (ref 70–99)
Potassium: 5.2 mEq/L — ABNORMAL HIGH (ref 3.5–5.1)
Sodium: 135 mEq/L (ref 135–145)
Total Bilirubin: 0.4 mg/dL (ref 0.3–1.2)
Total Protein: 8.1 g/dL (ref 6.0–8.3)

## 2013-03-17 LAB — URINALYSIS, ROUTINE W REFLEX MICROSCOPIC
Glucose, UA: NEGATIVE mg/dL
Hgb urine dipstick: NEGATIVE
Ketones, ur: NEGATIVE mg/dL
Leukocytes, UA: NEGATIVE
Nitrite: NEGATIVE
Protein, ur: NEGATIVE mg/dL
Specific Gravity, Urine: 1.025 (ref 1.005–1.030)
Urobilinogen, UA: 0.2 mg/dL (ref 0.0–1.0)
pH: 5 (ref 5.0–8.0)

## 2013-03-17 LAB — POCT I-STAT, CHEM 8
BUN: 38 mg/dL — ABNORMAL HIGH (ref 6–23)
BUN: 40 mg/dL — ABNORMAL HIGH (ref 6–23)
Calcium, Ion: 1.17 mmol/L (ref 1.13–1.30)
Calcium, Ion: 1.18 mmol/L (ref 1.13–1.30)
Chloride: 109 mEq/L (ref 96–112)
Chloride: 112 mEq/L (ref 96–112)
Creatinine, Ser: 4.4 mg/dL — ABNORMAL HIGH (ref 0.50–1.35)
Creatinine, Ser: 4.2 mg/dL — ABNORMAL HIGH (ref 0.50–1.35)
Glucose, Bld: 182 mg/dL — ABNORMAL HIGH (ref 70–99)
Glucose, Bld: 169 mg/dL — ABNORMAL HIGH (ref 70–99)
HCT: 50 % (ref 39.0–52.0)
HCT: 47 % (ref 39.0–52.0)
Hemoglobin: 17 g/dL (ref 13.0–17.0)
Hemoglobin: 16 g/dL (ref 13.0–17.0)
Potassium: 4.9 mEq/L (ref 3.5–5.1)
Potassium: 5.2 mEq/L — ABNORMAL HIGH (ref 3.5–5.1)
Sodium: 139 mEq/L (ref 135–145)
Sodium: 138 mEq/L (ref 135–145)
TCO2: 20 mmol/L (ref 0–100)
TCO2: 19 mmol/L (ref 0–100)

## 2013-03-17 LAB — GLUCOSE, CAPILLARY
Glucose-Capillary: 149 mg/dL — ABNORMAL HIGH (ref 70–99)
Glucose-Capillary: 180 mg/dL — ABNORMAL HIGH (ref 70–99)

## 2013-03-17 LAB — CG4 I-STAT (LACTIC ACID): Lactic Acid, Venous: 2.09 mmol/L (ref 0.5–2.2)

## 2013-03-17 MED ORDER — INSULIN DETEMIR 100 UNIT/ML ~~LOC~~ SOLN
12.0000 [IU] | Freq: Two times a day (BID) | SUBCUTANEOUS | Status: DC
  Administered 2013-03-17 – 2013-03-20 (×6): 12 [IU] via SUBCUTANEOUS
  Filled 2013-03-17 (×7): qty 0.12

## 2013-03-17 MED ORDER — PANTOPRAZOLE SODIUM 40 MG PO TBEC
40.0000 mg | DELAYED_RELEASE_TABLET | Freq: Every day | ORAL | Status: DC
  Administered 2013-03-18 – 2013-03-20 (×3): 40 mg via ORAL
  Filled 2013-03-17 (×3): qty 1

## 2013-03-17 MED ORDER — ACETAMINOPHEN 325 MG PO TABS: 650.0000 mg | ORAL_TABLET | Freq: Four times a day (QID) | ORAL | Status: DC | PRN

## 2013-03-17 MED ORDER — ATENOLOL 25 MG PO TABS
25.0000 mg | ORAL_TABLET | Freq: Every day | ORAL | Status: DC
  Administered 2013-03-18 – 2013-03-20 (×3): 25 mg via ORAL
  Filled 2013-03-17 (×3): qty 1

## 2013-03-17 MED ORDER — SODIUM CHLORIDE 0.9 % IV BOLUS (SEPSIS)
1000.0000 mL | Freq: Once | INTRAVENOUS | Status: AC
  Administered 2013-03-17: 1000 mL via INTRAVENOUS

## 2013-03-17 MED ORDER — HYDROCODONE-ACETAMINOPHEN 7.5-325 MG/15ML PO SOLN: 10.0000 mL | Freq: Once | ORAL | Status: DC

## 2013-03-17 MED ORDER — INSULIN ASPART 100 UNIT/ML ~~LOC~~ SOLN
0.0000 [IU] | Freq: Three times a day (TID) | SUBCUTANEOUS | Status: DC
  Administered 2013-03-18 (×3): 2 [IU] via SUBCUTANEOUS
  Administered 2013-03-19 – 2013-03-20 (×2): 1 [IU] via SUBCUTANEOUS

## 2013-03-17 MED ORDER — SODIUM CHLORIDE 0.9 % IV SOLN: INTRAVENOUS | Status: DC

## 2013-03-17 MED ORDER — ONDANSETRON 4 MG PO TBDP
8.0000 mg | ORAL_TABLET | Freq: Once | ORAL | Status: AC
  Administered 2013-03-17: 8 mg via ORAL
  Filled 2013-03-17: qty 2

## 2013-03-17 MED ORDER — INSULIN ASPART 100 UNIT/ML ~~LOC~~ SOLN: 0.0000 [IU] | Freq: Every day | SUBCUTANEOUS | Status: DC

## 2013-03-17 MED ORDER — AZITHROMYCIN 250 MG PO TABS: 500.0000 mg | ORAL_TABLET | Freq: Once | ORAL | Status: DC

## 2013-03-17 MED ORDER — ONDANSETRON HCL 4 MG PO TABS: 4.0000 mg | ORAL_TABLET | Freq: Four times a day (QID) | ORAL | Status: DC | PRN

## 2013-03-17 MED ORDER — ONDANSETRON HCL 4 MG/2ML IJ SOLN: 4.0000 mg | Freq: Four times a day (QID) | INTRAMUSCULAR | Status: DC | PRN

## 2013-03-17 MED ORDER — ALBUTEROL SULFATE HFA 108 (90 BASE) MCG/ACT IN AERS: 2.0000 | INHALATION_SPRAY | RESPIRATORY_TRACT | Status: DC | PRN

## 2013-03-17 MED ORDER — ACETAMINOPHEN 650 MG RE SUPP: 650.0000 mg | Freq: Four times a day (QID) | RECTAL | Status: DC | PRN

## 2013-03-17 MED ORDER — AMLODIPINE BESYLATE 5 MG PO TABS
5.0000 mg | ORAL_TABLET | Freq: Every day | ORAL | Status: DC
  Administered 2013-03-17 – 2013-03-19 (×3): 5 mg via ORAL
  Filled 2013-03-17 (×4): qty 1

## 2013-03-17 MED ORDER — HEPARIN SODIUM (PORCINE) 5000 UNIT/ML IJ SOLN
5000.0000 [IU] | Freq: Three times a day (TID) | INTRAMUSCULAR | Status: DC
  Administered 2013-03-17 – 2013-03-20 (×9): 5000 [IU] via SUBCUTANEOUS
  Filled 2013-03-17 (×11): qty 1

## 2013-03-17 MED ORDER — LEVOTHYROXINE SODIUM 75 MCG PO TABS
75.0000 ug | ORAL_TABLET | Freq: Every day | ORAL | Status: DC
  Administered 2013-03-18 – 2013-03-20 (×3): 75 ug via ORAL
  Filled 2013-03-17 (×4): qty 1

## 2013-03-17 MED ORDER — SODIUM CHLORIDE 0.9 % IV SOLN
INTRAVENOUS | Status: DC
  Administered 2013-03-17 – 2013-03-20 (×7): via INTRAVENOUS

## 2013-03-17 MED ORDER — SODIUM CHLORIDE 0.9 % IJ SOLN
3.0000 mL | Freq: Two times a day (BID) | INTRAMUSCULAR | Status: DC
  Administered 2013-03-18 – 2013-03-20 (×3): 3 mL via INTRAVENOUS

## 2013-03-17 NOTE — Progress Notes (Signed)
Received pt. From ED.Pt. From home with wife.Pt's skin intact,bottom is red but blanchable.pt. Does not uses any equipment to ambulates at home.pt. Was place on tele box # 5.Keep monitoring pt. And assessing his needs.

## 2013-03-17 NOTE — ED Provider Notes (Signed)
CSN: [id number]     Arrival date & time 03/17/13  1256 History     First MD Initiated Contact with Patient 03/17/13 1326     Chief Complaint  Patient presents with  . Nausea  . Emesis   (Consider location/radiation/quality/duration/timing/severity/associated sxs/prior Treatment) HPI  79 year old male with history of diabetes, CAD, prior stroke presents for evaluations of nausea vomiting and diarrhea. Patient states 5 days ago his wife developed nausea vomiting and diarrhea. 2 days later he developed the same symptoms. Patient reports vomiting is nonbloody, nonbilious, and having 2-3 bouts a day usually worsen after eating. Diarrhea is orange in color but nonbloody non-mucousy. States he has between 5-10 bouts a day for the past 2 days. He endorsed feeling lightheadedness, with mild headache. Patient felt dehydrated and states he has lost nearly 10 pounds within the week. Otherwise he denies fever, chills, chest pain, shortness of breath, abdominal pain, back pain, dysuria, numbness, or rash. There has been no recent travel, eating exotic food, or taking antibiotic. No recent medication changes.   Past Medical History  Diagnosis Date  . Coronary artery disease   . Hypertension   . Hyperlipemia   . Anginal pain 02/08/12  . Exertional dyspnea     "doesn't take much these days"  . Type II diabetes mellitus   . Hypothyroidism   . Stroke 09/2010    denies residual  . H/O hiatal hernia   . Arthritis     "all over"   Past Surgical History  Procedure Laterality Date  . Eye surgery    . Coronary artery bypass graft  1999    CABG X4  . Coronary angioplasty with stent placement  ~ 2004    "1"  . Cataract extraction w/ intraocular lens  implant, bilateral  ~ 2007  . Retinal detachment surgery  10/2011    right eye  . Colonoscopy w/ polypectomy  09/2011    "removed 7"   No family history on file. History  Substance Use Topics  . Smoking status: Former Smoker -- 2.00 packs/day for 35  years    Types: Cigarettes    Quit date: 07/25/1993  . Smokeless tobacco: Never Used  . Alcohol Use: Yes     Comment: 02/08/12 "stopped ~ 1995"    Review of Systems  All other systems reviewed and are negative.    Allergies  Review of patient's allergies indicates no known allergies.  Home Medications   Current Outpatient Rx  Name  Route  Sig  Dispense  Refill  . amLODipine (NORVASC) 10 MG tablet   Oral   Take 10 mg by mouth daily.         Marland Kitchen aspirin EC 81 MG tablet   Oral   Take 81 mg by mouth daily.         Marland Kitchen atenolol (TENORMIN) 25 MG tablet   Oral   Take 25 mg by mouth daily.         Marland Kitchen atorvastatin (LIPITOR) 20 MG tablet   Oral   Take 20 mg by mouth daily.         . clopidogrel (PLAVIX) 75 MG tablet   Oral   Take 75 mg by mouth daily.         . DiphenhydrAMINE HCl (BENADRYL ALLERGY PO)   Oral   Take 1 tablet by mouth daily as needed. For cold symptoms         . enalapril (VASOTEC) 20 MG tablet   Oral  Take 20 mg by mouth 2 (two) times daily.         . furosemide (LASIX) 40 MG tablet   Oral   Take 20-40 mg by mouth 2 (two) times daily.         . insulin detemir (LEVEMIR) 100 UNIT/ML injection   Subcutaneous   Inject 50 Units into the skin 2 (two) times daily.         . insulin lispro (HUMALOG) 100 UNIT/ML injection   Subcutaneous   Inject 20 Units into the skin 3 (three) times daily before meals.         Marland Kitchen levothyroxine (SYNTHROID, LEVOTHROID) 75 MCG tablet   Oral   Take 75 mcg by mouth daily.         . Liraglutide (VICTOZA [place])   Subcutaneous   Inject 1.8 Units into the skin every morning.          BP 92/42  Pulse 76  Temp(Src) 97.5 F (36.4 C) (Oral)  Resp 16  SpO2 93% Physical Exam  Nursing note and vitals reviewed. Constitutional: He appears well-developed and well-nourished. No distress.  Awake, alert, nontoxic appearance  HENT:  Head: Atraumatic.  Mouth/Throat: Oropharynx is clear and moist.  Eyes:  Conjunctivae are normal. Right eye exhibits no discharge. Left eye exhibits no discharge.  Neck: Normal range of motion. Neck supple.  Cardiovascular: Normal rate, regular rhythm and intact distal pulses.   Pulmonary/Chest: Effort normal. No respiratory distress. He exhibits no tenderness.  Abdominal: Soft. There is no tenderness. There is no rebound.  Musculoskeletal: He exhibits no edema and no tenderness.  ROM appears intact, no obvious focal weakness  Neurological: He is alert.  Skin: Skin is warm and dry. No rash noted.  Psychiatric: He has a normal mood and affect.    ED Course   Procedures (including critical care time)   Date: 03/17/2013  Rate: 70  Rhythm: normal sinus rhythm  QRS Axis: normal  Intervals: PR prolonged  ST/T Wave abnormalities: nonspecific ST/T changes  Conduction Disutrbances:first-degree A-V block   Narrative Interpretation:   Old EKG Reviewed: unchanged    1:55 PM Patient with nausea, vomiting, diarrhea to suggest likely viral infection since wife has same symptoms. No recent antibiotic use, low suspicion for Clostridium difficile infection. No significant abdominal pain to suggest diverticulosis diverticulitis or colitis. He is nontoxic in appearance however he is hypotensive with blood pressure of 92/42. We'll begin IV hydration, check basic labs, treat his symptoms and will continue to monitor. Care discussed with attending.  3:07 PM Patient has not elevated and potassium of 5.2. No EKG changes. He does appear to have evidence of acute renal injury with a BUN of 38 and creatinine of 4.15. This likely reflects his recent nausea vomiting diarrhea. Will continue with IV fluid hydration and will consult for admission for further management.  3:18 PM I have consulted [facility], Dr. Eliseo Squires, who agrees to admit pt to tele, team 10, under her care.  Pt's BP improves to 119/48 after 1L NS.  Pt mentating at baseline, not tachycardic.    Labs Reviewed   CBC WITH DIFFERENTIAL - Abnormal; Notable for the following:    Lymphocytes Relative 11 (*)    Monocytes Relative 15 (*)    Monocytes Absolute 1.5 (*)    All other components within normal limits  COMPREHENSIVE METABOLIC PANEL - Abnormal; Notable for the following:    Potassium 5.2 (*)    CO2 15 (*)    Glucose,  Bld 175 (*)    BUN 38 (*)    Creatinine, Ser 4.15 (*)    GFR calc non Af Amer 13 (*)    GFR calc Af Amer 15 (*)    All other components within normal limits  POCT I-STAT, CHEM 8 - Abnormal; Notable for the following:    BUN 38 (*)    Creatinine, Ser 4.40 (*)    Glucose, Bld 182 (*)    All other components within normal limits  URINALYSIS, ROUTINE W REFLEX MICROSCOPIC   No results found. 1. Nausea vomiting and diarrhea   2. Acute renal injury     MDM  BP 119/48  Pulse 69  Temp(Src) 97.5 F (36.4 C) (Oral)  Resp 20  SpO2 94%  I have reviewed nursing notes and vital signs.  I reviewed available ER/hospitalization records thought the EMR    Domenic Moras, [place] 03/17/13 1520

## 2013-03-17 NOTE — ED Notes (Signed)
Pt. Stated, I 've had N/V/D since Thursday and I also have a headache.  My lt. Ear seems like its plugged up,

## 2013-03-17 NOTE — H&P (Signed)
[facility] History and Physical  Luke [name] [id number] DOB: [birth year] DOA: 03/17/2013  Referring physician: er PCP: Milagros Evener, MD  Specialists:   Chief Complaint: N/V/D  HPI: Luke [name] is a 79 y.o. male  Who started getting n/v/d on Friday.  The nausea and vomiting stopped but diarrhea continues.  His wife is also in the ER, sick with the same illness- her started on Wednesday.  When he was vomiting it was nonbloody, nonbilious, and had 2-3 bouts a day. Diarrhea is orange in color but nonbloody non-mucousy. States he has between 5-10 bouts a day for the past 2 days. He endorsed feeling lightheadedness, with mild headache. Patient felt dehydrated and states he has lost nearly 10 pounds within the week. Otherwise he denies fever, chills, chest pain, shortness of breath, abdominal pain, back pain, dysuria, numbness, or rash. There has been no recent travel, eating exotic food, or taking antibiotic  In the ER, his  Cr was found to be elevated.  He was still taking lasix despite the N/V/D.      Review of Systems: all systems reviewed, negative unless stated above    Past Medical History  Diagnosis Date  . Coronary artery disease   . Hypertension   . Hyperlipemia   . Anginal pain 02/08/12  . Exertional dyspnea     "doesn't take much these days"  . Type II diabetes mellitus   . Hypothyroidism   . Stroke 09/2010    denies residual  . H/O hiatal hernia   . Arthritis     "all over"   Past Surgical History  Procedure Laterality Date  . Eye surgery    . Coronary artery bypass graft  1999    CABG X4  . Coronary angioplasty with stent placement  ~ 2004    "1"  . Cataract extraction w/ intraocular lens  implant, bilateral  ~ 2007  . Retinal detachment surgery  10/2011    right eye  . Colonoscopy w/ polypectomy  09/2011    "removed 7"   Social History:  reports that he quit smoking about 19 years ago. His smoking use included Cigarettes. He has a 70 pack-year  smoking history. He has never used smokeless tobacco. He reports that  drinks alcohol. He reports that he does not use illicit drugs.  No Known Allergies  Family Hx: HTN  Prior to Admission medications   Medication Sig Start Date End Date Taking? Authorizing Provider  amLODipine (NORVASC) 10 MG tablet Take 5 mg by mouth at bedtime.   Yes Historical Provider, MD  Aromatic Inhalants (VICKS VAPOINHALER IN) Inhale 1 Inhaler into the lungs daily as needed (For congestion).   Yes Historical Provider, MD  atenolol (TENORMIN) 25 MG tablet Take 25 mg by mouth daily.   Yes Historical Provider, MD  atorvastatin (LIPITOR) 20 MG tablet Take 20 mg by mouth daily.   Yes Historical Provider, MD  enalapril (VASOTEC) 20 MG tablet Take 20 mg by mouth 2 (two) times daily.   Yes Historical Provider, MD  furosemide (LASIX) 40 MG tablet Take 40 mg by mouth daily.   Yes Historical Provider, MD  insulin aspart (NOVOLOG) 100 UNIT/ML injection Inject 8 Units into the skin 3 (three) times daily with meals.   Yes Historical Provider, MD  insulin detemir (LEVEMIR) 100 UNIT/ML injection Inject 12 Units into the skin 2 (two) times daily.   Yes Historical Provider, MD  insulin lispro (HUMALOG) 100 UNIT/ML injection Inject 20 Units into the skin 3 (three) times  daily before meals.   Yes Historical Provider, MD  levothyroxine (SYNTHROID, LEVOTHROID) 75 MCG tablet Take 75 mcg by mouth daily.   Yes Historical Provider, MD  Liraglutide (VICTOZA [place]) Inject 1.8 Units into the skin every morning.   Yes Historical Provider, MD  omeprazole (PRILOSEC) 40 MG capsule Take 40 mg by mouth daily.   Yes Historical Provider, MD  polyvinyl alcohol (LIQUIFILM TEARS) 1.4 % ophthalmic solution Place 1 drop into both eyes as needed (For dry eyes).   Yes Historical Provider, MD   Physical Exam: Filed Vitals:   03/17/13 1514  BP: 119/48  Pulse: 69  Temp:   Resp: 20     General:  A+OX3, NAD  Eyes: wnl  ENT: dry mucous membranes  Neck:  supple  Cardiovascular: rrr  Respiratory: clear anterior  Abdomen: increased bowel sounds, soft, NT  Skin: no rashes or lesions  Musculoskeletal: moves all 4 ext, no edema  Psychiatric: no SI/no HI  Neurologic: CN 2-12 intact  Labs on Admission:  Basic Metabolic Panel:  Recent Labs Lab 03/17/13 1322 03/17/13 1442  NA 135 139  K 5.2* 4.9  CL 101 109  CO2 15*  --   GLUCOSE 175* 182*  BUN 38* 38*  CREATININE 4.15* 4.40*  CALCIUM 9.4  --    Liver Function Tests:  Recent Labs Lab 03/17/13 1322  AST 36  ALT 31  ALKPHOS 76  BILITOT 0.4  PROT 8.1  ALBUMIN 4.6   No results found for this basename: LIPASE, AMYLASE,  in the last 168 hours No results found for this basename: AMMONIA,  in the last 168 hours CBC:  Recent Labs Lab 03/17/13 1322 03/17/13 1442  WBC 9.7  --   NEUTROABS 7.0  --   HGB 17.0 17.0  HCT 49.2 50.0  MCV 89.8  --   PLT 222  --    Cardiac Enzymes: No results found for this basename: CKTOTAL, CKMB, CKMBINDEX, TROPONINI,  in the last 168 hours  BNP (last 3 results) No results found for this basename: PROBNP,  in the last 8760 hours CBG: No results found for this basename: GLUCAP,  in the last 168 hours  Radiological Exams on Admission: No results found.  EKG: Independently reviewed. NSR with 1 deg AV block  Assessment/Plan Principal Problem:   AKI (acute kidney injury) Active Problems:   N&V (nausea and vomiting)   Diarrhea   DM (diabetes mellitus)   Hypotension   Dizziness   1. AKI on CKD- IVF, hold nephrotoxic agents, suspect from hypotension + hypovolemia + medications 2. N/V/D- suspect viral in etiology as wife has same symptoms- run pathogen panel Noro/entero 3. DM- SSI and levemir- hold other agents 4. Hypotension/dizziness- IVF, monitor, once BP better, PT eval 5. Tele bed    Code Status: full Family Communication: son at bedside Disposition Plan:  admit  Time spent: 4 min  Eulogio Bear [facility] Pager [phone]  If 7PM-7AM, please contact night-coverage [email or web address] Password [facility] 03/17/2013, 3:45 PM

## 2013-03-17 NOTE — ED Notes (Signed)
Report called to floor. At this time no bed in room upstairs, nurse to call when room is ready.

## 2013-03-17 NOTE — ED Notes (Signed)
Bed ready

## 2013-03-18 DIAGNOSIS — N179 Acute kidney failure, unspecified: Principal | ICD-10-CM

## 2013-03-18 DIAGNOSIS — R112 Nausea with vomiting, unspecified: Secondary | ICD-10-CM

## 2013-03-18 DIAGNOSIS — R197 Diarrhea, unspecified: Secondary | ICD-10-CM

## 2013-03-18 LAB — CBC
HCT: 43.5 % (ref 39.0–52.0)
Hemoglobin: 15.3 g/dL (ref 13.0–17.0)
MCH: 31.4 pg (ref 26.0–34.0)
MCHC: 35.2 g/dL (ref 30.0–36.0)
MCV: 89.3 fL (ref 78.0–100.0)
Platelets: 163 10*3/uL (ref 150–400)
RBC: 4.87 MIL/uL (ref 4.22–5.81)
RDW: 13.7 % (ref 11.5–15.5)
WBC: 10 10*3/uL (ref 4.0–10.5)

## 2013-03-18 LAB — CLOSTRIDIUM DIFFICILE BY PCR: Toxigenic C. Difficile by PCR: NEGATIVE

## 2013-03-18 LAB — COMPREHENSIVE METABOLIC PANEL
ALT: 22 U/L (ref 0–53)
AST: 25 U/L (ref 0–37)
Albumin: 4 g/dL (ref 3.5–5.2)
Alkaline Phosphatase: 69 U/L (ref 39–117)
BUN: 47 mg/dL — ABNORMAL HIGH (ref 6–23)
CO2: 17 mEq/L — ABNORMAL LOW (ref 19–32)
Calcium: 8.7 mg/dL (ref 8.4–10.5)
Chloride: 103 mEq/L (ref 96–112)
Creatinine, Ser: 4.16 mg/dL — ABNORMAL HIGH (ref 0.50–1.35)
GFR calc Af Amer: 15 mL/min — ABNORMAL LOW (ref >90)
GFR calc non Af Amer: 13 mL/min — ABNORMAL LOW (ref >90)
Glucose, Bld: 193 mg/dL — ABNORMAL HIGH (ref 70–99)
Potassium: 4.5 mEq/L (ref 3.5–5.1)
Sodium: 136 mEq/L (ref 135–145)
Total Bilirubin: 0.3 mg/dL (ref 0.3–1.2)
Total Protein: 7.2 g/dL (ref 6.0–8.3)

## 2013-03-18 LAB — GLUCOSE, CAPILLARY
Glucose-Capillary: 173 mg/dL — ABNORMAL HIGH (ref 70–99)
Glucose-Capillary: 170 mg/dL — ABNORMAL HIGH (ref 70–99)
Glucose-Capillary: 153 mg/dL — ABNORMAL HIGH (ref 70–99)
Glucose-Capillary: 120 mg/dL — ABNORMAL HIGH (ref 70–99)

## 2013-03-18 NOTE — Progress Notes (Addendum)
[facility] PROGRESS NOTE  Luke [name] [id number] DOB: Jul 26, 1932 DOA: 03/17/2013 PCP: Milagros Evener, MD  Assessment/Plan: 1. Acute on chronic renal failure 1. Cont to hold nephrotoxic drugs 2. Cont aggressive hydration 3. Improving with hydration 2. N/V/D: 1. Likely secondary to norovirus vs acute infectious gastroenteritis 2. Slowly improving 3. Positive sick contact 4. Cdiff neg 5. Stool cultures pending 3. Hypotension: 1. Normalized 2. Cont IVF 3. Cont to monitor  Code Status: Full Family Communication: Pt in room (indicate person spoken with, relationship, and if by phone, the number) Disposition Plan: Pending   HPI/Subjective: Pt continues to have diarrhea.  Objective: Filed Vitals:   03/17/13 2002 03/18/13 0334 03/18/13 1000 03/18/13 1320  BP: 142/45 119/45 130/48 115/66  Pulse: 85 88 98 77  Temp: 97.5 F (36.4 C) 98.6 F (37 C) 98.2 F (36.8 C) 97.2 F (36.2 C)  TempSrc: Oral Oral Oral Oral  Resp: 20 20 22 20   Height: 5' 10.5" (1.791 m)     Weight: 104.1 kg (229 lb 8 oz)     SpO2: 95% 95% 98% 93%    Intake/Output Summary (Last 24 hours) at 03/18/13 1601 Last data filed at 03/18/13 1406  Gross per 24 hour  Intake 2073.33 ml  Output      5 ml  Net 2068.33 ml   Filed Weights   03/17/13 2002  Weight: 104.1 kg (229 lb 8 oz)    Exam:   General:  Awake, in nad  Cardiovascular: regular,s1, s2  Respiratory: normal resp effort, no wheezing  Abdomen: soft, nondistended  Musculoskeletal: perfused, no clubbing   Data Reviewed: Basic Metabolic Panel:  Recent Labs Lab 03/17/13 1322 03/17/13 1442 03/17/13 1604 03/18/13 0513  NA 135 139 138 136  K 5.2* 4.9 5.2* 4.5  CL 101 109 112 103  CO2 15*  --   --  17*  GLUCOSE 175* 182* 169* 193*  BUN 38* 38* 40* 47*  CREATININE 4.15* 4.40* 4.20* 4.16*  CALCIUM 9.4  --   --  8.7   Liver Function Tests:  Recent Labs Lab 03/17/13 1322 03/18/13 0513  AST 36 25  ALT 31 22   ALKPHOS 76 69  BILITOT 0.4 0.3  PROT 8.1 7.2  ALBUMIN 4.6 4.0   No results found for this basename: LIPASE, AMYLASE,  in the last 168 hours No results found for this basename: AMMONIA,  in the last 168 hours CBC:  Recent Labs Lab 03/17/13 1322 03/17/13 1442 03/17/13 1604 03/18/13 0513  WBC 9.7  --   --  10.0  NEUTROABS 7.0  --   --   --   HGB 17.0 17.0 16.0 15.3  HCT 49.2 50.0 47.0 43.5  MCV 89.8  --   --  89.3  PLT 222  --   --  163   Cardiac Enzymes: No results found for this basename: CKTOTAL, CKMB, CKMBINDEX, TROPONINI,  in the last 168 hours BNP (last 3 results) No results found for this basename: PROBNP,  in the last 8760 hours CBG:  Recent Labs Lab 03/17/13 1655 03/17/13 2203 03/18/13 0820 03/18/13 1216  GLUCAP 149* 180* 173* 170*    Recent Results (from the past 240 hour(s))  CLOSTRIDIUM DIFFICILE BY PCR     Status: None   Collection Time    03/17/13  8:34 PM      Result Value Range Status   C difficile by pcr NEGATIVE  NEGATIVE Final     Studies: No results found.  Scheduled Meds: .  amLODipine  5 mg Oral QHS  . atenolol  25 mg Oral Daily  . heparin  5,000 Units Subcutaneous Q8H  . insulin aspart  0-5 Units Subcutaneous QHS  . insulin aspart  0-9 Units Subcutaneous TID WC  . insulin detemir  12 Units Subcutaneous BID  . levothyroxine  75 mcg Oral QAC breakfast  . pantoprazole  40 mg Oral QAC breakfast  . sodium chloride  3 mL Intravenous Q12H   Continuous Infusions: . sodium chloride 100 mL/hr at 03/18/13 0800    Principal Problem:   AKI (acute kidney injury) Active Problems:   N&V (nausea and vomiting)   Diarrhea   DM (diabetes mellitus)   Hypotension   Dizziness  Time spent: 79min  Jase Himmelberger, [place] [facility] Pager [phone]. If 7PM-7AM, please contact night-coverage at [email or web address], password [facility] 03/18/2013, 4:01 PM  LOS: 1 day

## 2013-03-18 NOTE — Progress Notes (Signed)
   CARE MANAGEMENT NOTE 03/18/2013  Patient:  Luke [name], Luke [name]   Account Number:  [id number]  Date Initiated:  03/18/2013  Documentation initiated by:  Olga Coaster  Subjective/Objective Assessment:   ADMITTED WITH NAUSEA/ VOMITING, DIARRHEA     Action/Plan:   PCP: Milagros Evener, MD  LIVES AT [place]; CM FOLLOWING FOR DCP   Anticipated DC Date:  03/21/2013   Anticipated DC Plan:  [place]  CM consult          Status of service:  In process, will continue to follow Medicare Important Message given?  NA - LOS <3 / Initial given by admissions (If response is "NO", the following Medicare IM given date fields will be blank)  Per UR Regulation:  Reviewed for med. necessity/level of care/duration of stay  Comments:  03/18/2013- B Ziah Leandro RN,BSN,MHA

## 2013-03-19 LAB — GI PATHOGEN PANEL BY PCR, STOOL
C difficile toxin A/B: NEGATIVE
Campylobacter by PCR: NEGATIVE
Cryptosporidium by PCR: NEGATIVE
E coli (ETEC) LT/ST: NEGATIVE
E coli (STEC): NEGATIVE
E coli 0157 by PCR: NEGATIVE
G lamblia by PCR: NEGATIVE
Norovirus GI/GII: NEGATIVE
Rotavirus A by PCR: POSITIVE
Salmonella by PCR: NEGATIVE
Shigella by PCR: NEGATIVE

## 2013-03-19 LAB — URINALYSIS, ROUTINE W REFLEX MICROSCOPIC
Bilirubin Urine: NEGATIVE
Glucose, UA: NEGATIVE mg/dL
Ketones, ur: NEGATIVE mg/dL
Leukocytes, UA: NEGATIVE
Nitrite: NEGATIVE
Protein, ur: 30 mg/dL — AB
Specific Gravity, Urine: 1.018 (ref 1.005–1.030)
Urobilinogen, UA: 0.2 mg/dL (ref 0.0–1.0)
pH: 5.5 (ref 5.0–8.0)

## 2013-03-19 LAB — BASIC METABOLIC PANEL
BUN: 50 mg/dL — ABNORMAL HIGH (ref 6–23)
BUN: 50 mg/dL — ABNORMAL HIGH (ref 6–23)
CO2: 15 mEq/L — ABNORMAL LOW (ref 19–32)
CO2: 13 mEq/L — ABNORMAL LOW (ref 19–32)
Calcium: 8.3 mg/dL — ABNORMAL LOW (ref 8.4–10.5)
Calcium: 8 mg/dL — ABNORMAL LOW (ref 8.4–10.5)
Chloride: 110 mEq/L (ref 96–112)
Chloride: 110 mEq/L (ref 96–112)
Creatinine, Ser: 2.66 mg/dL — ABNORMAL HIGH (ref 0.50–1.35)
Creatinine, Ser: 2.39 mg/dL — ABNORMAL HIGH (ref 0.50–1.35)
GFR calc Af Amer: 25 mL/min — ABNORMAL LOW (ref >90)
GFR calc Af Amer: 28 mL/min — ABNORMAL LOW (ref >90)
GFR calc non Af Amer: 22 mL/min — ABNORMAL LOW (ref >90)
GFR calc non Af Amer: 25 mL/min — ABNORMAL LOW (ref >90)
Glucose, Bld: 110 mg/dL — ABNORMAL HIGH (ref 70–99)
Glucose, Bld: 133 mg/dL — ABNORMAL HIGH (ref 70–99)
Potassium: 3.5 mEq/L (ref 3.5–5.1)
Potassium: 4 mEq/L (ref 3.5–5.1)
Sodium: 138 mEq/L (ref 135–145)
Sodium: 135 mEq/L (ref 135–145)

## 2013-03-19 LAB — GLUCOSE, CAPILLARY
Glucose-Capillary: 96 mg/dL (ref 70–99)
Glucose-Capillary: 126 mg/dL — ABNORMAL HIGH (ref 70–99)
Glucose-Capillary: 131 mg/dL — ABNORMAL HIGH (ref 70–99)
Glucose-Capillary: 124 mg/dL — ABNORMAL HIGH (ref 70–99)

## 2013-03-19 LAB — URINE MICROSCOPIC-ADD ON

## 2013-03-19 NOTE — Progress Notes (Signed)
Pt has accidentally removed IV. Chrisandra Netters NP to assess whether pt still needs to maintain an IV access if possible discharge is tomorrow. Awaiting call. Velora Mediate

## 2013-03-19 NOTE — Progress Notes (Addendum)
Received call from lab. According to pt's GI panel completed last Friday, results show that pt has rotavirus. [facility]. Forrest Moron NP returned call. No new orders at this time. Velora Mediate

## 2013-03-20 LAB — BASIC METABOLIC PANEL
BUN: 39 mg/dL — ABNORMAL HIGH (ref 6–23)
CO2: 14 mEq/L — ABNORMAL LOW (ref 19–32)
Calcium: 8.5 mg/dL (ref 8.4–10.5)
Chloride: 112 mEq/L (ref 96–112)
Creatinine, Ser: 1.78 mg/dL — ABNORMAL HIGH (ref 0.50–1.35)
GFR calc Af Amer: 41 mL/min — ABNORMAL LOW (ref >90)
GFR calc non Af Amer: 35 mL/min — ABNORMAL LOW (ref >90)
Glucose, Bld: 101 mg/dL — ABNORMAL HIGH (ref 70–99)
Potassium: 3.5 mEq/L (ref 3.5–5.1)
Sodium: 137 mEq/L (ref 135–145)

## 2013-03-20 LAB — GLUCOSE, CAPILLARY
Glucose-Capillary: 92 mg/dL (ref 70–99)
Glucose-Capillary: 132 mg/dL — ABNORMAL HIGH (ref 70–99)

## 2013-03-20 MED ORDER — LOPERAMIDE HCL 2 MG PO CAPS: 2.0000 mg | ORAL_CAPSULE | Freq: Four times a day (QID) | ORAL | Status: DC | PRN

## 2013-03-20 MED ORDER — SODIUM CHLORIDE 0.9 % IV SOLN: INTRAVENOUS | Status: DC

## 2013-03-20 MED ORDER — LOPERAMIDE HCL 2 MG PO CAPS
2.0000 mg | ORAL_CAPSULE | ORAL | Status: DC | PRN
  Administered 2013-03-20: 2 mg via ORAL
  Filled 2013-03-20: qty 1

## 2013-03-20 NOTE — Discharge Summary (Signed)
[facility]                                                                                   Luke [name], is a 79 y.o. male  DOB 01/09/1933  MRN [id number].  Admission date:  03/17/2013  Discharge Date:  03/20/2013  Primary MD  Milagros Evener, MD  Admitting Physician  Geradine Girt, DO  Admission Diagnosis  Diarrhea [787.91] Dizziness [780.4] Nausea vomiting and diarrhea [787.91, 787.01] Acute renal injury [584.9]  Discharge Diagnosis     Principal Problem:   AKI (acute kidney injury) Active Problems:   N&V (nausea and vomiting)   Diarrhea   DM (diabetes mellitus)   Hypotension   Dizziness    Past Medical History  Diagnosis Date  . Coronary artery disease   . Hypertension   . Hyperlipemia   . Anginal pain 02/08/12  . Exertional dyspnea     "doesn't take much these days"  . Type II diabetes mellitus   . Hypothyroidism   . Stroke 09/2010    denies residual  . H/O hiatal hernia   . Arthritis     "all over"    Past Surgical History  Procedure Laterality Date  . Eye surgery    . Coronary artery bypass graft  1999    CABG X4  . Coronary angioplasty with stent placement  ~ 2004    "1"  . Cataract extraction w/ intraocular lens  implant, bilateral  ~ 2007  . Retinal detachment surgery  10/2011    right eye  . Colonoscopy w/ polypectomy  09/2011    "removed 7"     Recommendations for primary care physician for things to follow:   Kindly follow BMP and adjust blood pressure medications as needed   Discharge Diagnoses:   Principal Problem:   AKI (acute kidney injury) Active Problems:   N&V (nausea and vomiting)   Diarrhea   DM (diabetes mellitus)   Hypotension   Dizziness    Discharge Condition: Stable   Diet recommendation: See Discharge Instructions below   Consults none    History of present illness and  Hospital Course:     Kindly see H&P for history of present illness and admission details, please review complete Labs,  Consult reports and Test reports for all details in brief Luke [name], is a 79 y.o. male, patient with history of diabetes mellitus type 2 on insulin, hypertension, chronic kidney disease stage IV baseline creatinine around 1.5 was admitted to the hospital after he developed viral gastroenteritis stool cultures positive for rotavirus likely acquired from his wife, he also developed dehydration hypotension and acute renal failure due to the same. He was treated with IV fluids and his Lasix and ACE inhibitor were held with good effect, his blood pressures and creatinine have improved considerably and his renal function is close to his baseline, his stool is negative for C. difficile and positive for rotavirus, his wife recently had a similar infection, he will be placed on when necessary Imodium and will be discharged home on his home medications with the exception of Lasix and ACE inhibitor. I will request him to follow with his primary care physician  in 3 days and to get his BMP rechecked and hypertensive medications readdressed.   He is today feeling better he is symptom free and in good spirits and wants to go home. He denies any abdominal pain chest pain, no blood or mucus in stools.      Today   Subjective:   Luke [name] today has no headache,no chest abdominal pain,no new weakness tingling or numbness, feels much better wants to go home today. Diarrhea is much improved  Objective:   Blood pressure 115/30, pulse 64, temperature 98.6 F (37 C), temperature source Oral, resp. rate 18, height 5' 10.5" (1.791 m), weight 102.2 kg (225 lb 5 oz), SpO2 98.00%.   Intake/Output Summary (Last 24 hours) at 03/20/13 1304 Last data filed at 03/20/13 0900  Gross per 24 hour  Intake 1283.75 ml  Output      0 ml  Net 1283.75 ml    Exam Awake Alert, Oriented *3, No new F.N deficits, Normal affect [place].AT,PERRAL Supple Neck,No JVD, No cervical lymphadenopathy appriciated.  Symmetrical Chest wall  movement, Good air movement bilaterally, CTAB RRR,No Gallops,Rubs or new Murmurs, No Parasternal Heave +ve B.Sounds, Abd Soft, Non tender, No organomegaly appriciated, No rebound -guarding or rigidity. No Cyanosis, Clubbing or edema, No new Rash or bruise  Data Review   Major procedures and Radiology Reports - PLEASE review detailed and final reports for all details, in brief -       No results found.  Micro Results     Recent Results (from the past 240 hour(s))  CLOSTRIDIUM DIFFICILE BY PCR     Status: None   Collection Time    03/17/13  8:34 PM      Result Value Range Status   C difficile by pcr NEGATIVE  NEGATIVE Final     CBC w Diff: Lab Results  Component Value Date   WBC 10.0 03/18/2013   HGB 15.3 03/18/2013   HCT 43.5 03/18/2013   PLT 163 03/18/2013   LYMPHOPCT 11* 03/17/2013   MONOPCT 15* 03/17/2013   EOSPCT 0 03/17/2013   BASOPCT 1 03/17/2013    CMP: Lab Results  Component Value Date   NA 137 03/20/2013   K 3.5 03/20/2013   CL 112 03/20/2013   CO2 14* 03/20/2013   BUN 39* 03/20/2013   CREATININE 1.78* 03/20/2013   PROT 7.2 03/18/2013   ALBUMIN 4.0 03/18/2013   BILITOT 0.3 03/18/2013   ALKPHOS 69 03/18/2013   AST 25 03/18/2013   ALT 22 03/18/2013  .   Discharge Instructions     Follow with Primary MD Milagros Evener, MD in 3 days   Get CBC, CMP, checked 3 days by Primary MD and again as instructed by your Primary MD.    Accuchecks 4 times/day, Once in AM empty stomach and then before each meal. Log in all results and show them to your Prim.MD in 3 days. If any glucose reading is under 80 or above 300 call your Prim MD immidiately. Follow Low glucose instructions for glucose under 80 as instructed.   Get Medicines reviewed and adjusted.  Please request your Prim.MD to go over all Hospital Tests and Procedure/Radiological results at the follow up, please get all Hospital records sent to your Prim MD by signing hospital release before you go home.  Activity:  As tolerated with Full fall precautions use walker/cane & assistance as needed   Diet:  Heart healthy - low carbohydrate  For Heart failure patients - Check your Weight same time  everyday, if you gain over 2 pounds, or you develop in leg swelling, experience more shortness of breath or chest pain, call your Primary MD immediately. Follow Cardiac Low Salt Diet and 1.8 lit/day fluid restriction.  Disposition Home    If you experience worsening of your admission symptoms, develop shortness of breath, life threatening emergency, suicidal or homicidal thoughts you must seek medical attention immediately by calling 911 or calling your MD immediately  if symptoms less severe.  You Must read complete instructions/literature along with all the possible adverse reactions/side effects for all the Medicines you take and that have been prescribed to you. Take any new Medicines after you have completely understood and accpet all the possible adverse reactions/side effects.   Do not drive and provide baby sitting services if your were admitted for syncope or siezures until you have seen by Primary MD or a Neurologist and advised to do so again.  Do not drive when taking Pain medications.    Do not take more than prescribed Pain, Sleep and Anxiety Medications  Special Instructions: If you have smoked or chewed Tobacco  in the last 2 yrs please stop smoking, stop any regular Alcohol  and or any Recreational drug use.  Wear Seat belts while driving.   Please note  You were cared for by a hospitalist during your hospital stay. If you have any questions about your discharge medications or the care you received while you were in the hospital after you are discharged, you can call the unit and asked to speak with the hospitalist on call if the hospitalist that took care of you is not available. Once you are discharged, your primary care physician will handle any further medical issues. Please note that NO  REFILLS for any discharge medications will be authorized once you are discharged, as it is imperative that you return to your primary care physician (or establish a relationship with a primary care physician if you do not have one) for your aftercare needs so that they can reassess your need for medications and monitor your lab values.    Follow-up Information   Follow up with Milagros Evener, MD. Schedule an appointment as soon as possible for a visit in 3 days.   Specialty:  Family Medicine   Contact information:   [id number] [place] [address] [phone]         Discharge Medications     Medication List    STOP taking these medications       enalapril 20 MG tablet  Commonly known as:  VASOTEC     furosemide 40 MG tablet  Commonly known as:  LASIX      TAKE these medications       amLODipine 10 MG tablet  Commonly known as:  NORVASC  Take 5 mg by mouth at bedtime.     atenolol 25 MG tablet  Commonly known as:  TENORMIN  Take 25 mg by mouth daily.     atorvastatin 20 MG tablet  Commonly known as:  LIPITOR  Take 20 mg by mouth daily.     insulin aspart 100 UNIT/ML injection  Commonly known as:  novoLOG  Inject 8 Units into the skin 3 (three) times daily with meals.     insulin detemir 100 UNIT/ML injection  Commonly known as:  LEVEMIR  Inject 12 Units into the skin 2 (two) times daily.     insulin lispro 100 UNIT/ML injection  Commonly known as:  HUMALOG  Inject  20 Units into the skin 3 (three) times daily before meals.     levothyroxine 75 MCG tablet  Commonly known as:  SYNTHROID, LEVOTHROID  Take 75 mcg by mouth daily.     loperamide 2 MG capsule  Commonly known as:  IMODIUM  Take 1 capsule (2 mg total) by mouth 4 (four) times daily as needed for diarrhea or loose stools.     omeprazole 40 MG capsule  Commonly known as:  PRILOSEC  Take 40 mg by mouth daily.     polyvinyl alcohol 1.4 % ophthalmic solution  Commonly known as:   LIQUIFILM TEARS  Place 1 drop into both eyes as needed (For dry eyes).     VICKS VAPOINHALER IN  Inhale 1 Inhaler into the lungs daily as needed (For congestion).     VICTOZA [place]  Inject 1.8 Units into the skin every morning.           Total Time in preparing paper work, data evaluation and todays exam - 35 minutes  Thurnell Lose M.D on 03/20/2013 at 1:04 PM  [place]  [phone]

## 2013-03-20 NOTE — Progress Notes (Signed)
Pt. Got d/C instructions and follow up appointments.prescription was call to pharmacy by MD.,IV was d/C.tele was d/C.A cab was call to take pt. Home.pt. Ready to be discharge.

## 2013-03-20 NOTE — ED Provider Notes (Signed)
Medical screening examination/treatment/procedure(s) were conducted as a shared visit with non-physician practitioner(s) and myself.  I personally evaluated the patient during the encounter.   Please see my separate note.    Houston Siren, MD 03/20/13 1352

## 2013-03-20 NOTE — ED Provider Notes (Signed)
Medical screening examination/treatment/procedure(s) were conducted as a shared visit with non-physician practitioner(s) and myself.  I personally evaluated the patient during the encounter  79 yo male with diarrhea and vomiting for a few days.  Nontoxic, but dehydrated appearing.  Abdomen soft and nontender.  Labs show acute renal failure.  Admitted to IM.  Clinical Impression: 1. Nausea vomiting and diarrhea   2. Acute renal injury      Houston Siren, MD 03/20/13 1352

## 2013-04-01 DIAGNOSIS — E11359 Type 2 diabetes mellitus with proliferative diabetic retinopathy without macular edema: Secondary | ICD-10-CM | POA: Diagnosis not present

## 2013-04-01 DIAGNOSIS — H43829 Vitreomacular adhesion, unspecified eye: Secondary | ICD-10-CM | POA: Diagnosis not present

## 2013-04-01 DIAGNOSIS — E11349 Type 2 diabetes mellitus with severe nonproliferative diabetic retinopathy without macular edema: Secondary | ICD-10-CM | POA: Diagnosis not present

## 2013-04-01 DIAGNOSIS — H35359 Cystoid macular degeneration, unspecified eye: Secondary | ICD-10-CM | POA: Diagnosis not present

## 2013-04-01 DIAGNOSIS — E1139 Type 2 diabetes mellitus with other diabetic ophthalmic complication: Secondary | ICD-10-CM | POA: Diagnosis not present

## 2013-04-04 DIAGNOSIS — N2581 Secondary hyperparathyroidism of renal origin: Secondary | ICD-10-CM | POA: Diagnosis not present

## 2013-04-04 DIAGNOSIS — N183 Chronic kidney disease, stage 3 unspecified: Secondary | ICD-10-CM | POA: Diagnosis not present

## 2013-04-04 DIAGNOSIS — R809 Proteinuria, unspecified: Secondary | ICD-10-CM | POA: Diagnosis not present

## 2013-04-04 DIAGNOSIS — I129 Hypertensive chronic kidney disease with stage 1 through stage 4 chronic kidney disease, or unspecified chronic kidney disease: Secondary | ICD-10-CM | POA: Diagnosis not present

## 2013-04-04 DIAGNOSIS — D649 Anemia, unspecified: Secondary | ICD-10-CM | POA: Diagnosis not present

## 2013-04-04 DIAGNOSIS — E119 Type 2 diabetes mellitus without complications: Secondary | ICD-10-CM | POA: Diagnosis not present

## 2013-04-08 DIAGNOSIS — E785 Hyperlipidemia, unspecified: Secondary | ICD-10-CM | POA: Diagnosis not present

## 2013-04-08 DIAGNOSIS — Z23 Encounter for immunization: Secondary | ICD-10-CM | POA: Diagnosis not present

## 2013-04-08 DIAGNOSIS — E1129 Type 2 diabetes mellitus with other diabetic kidney complication: Secondary | ICD-10-CM | POA: Diagnosis not present

## 2013-04-08 DIAGNOSIS — I10 Essential (primary) hypertension: Secondary | ICD-10-CM | POA: Diagnosis not present

## 2013-04-08 DIAGNOSIS — N183 Chronic kidney disease, stage 3 unspecified: Secondary | ICD-10-CM | POA: Diagnosis not present

## 2013-04-08 DIAGNOSIS — E039 Hypothyroidism, unspecified: Secondary | ICD-10-CM | POA: Diagnosis not present

## 2013-04-08 DIAGNOSIS — E8881 Metabolic syndrome: Secondary | ICD-10-CM | POA: Diagnosis not present

## 2013-04-08 DIAGNOSIS — Z794 Long term (current) use of insulin: Secondary | ICD-10-CM | POA: Diagnosis not present

## 2013-07-09 DIAGNOSIS — N183 Chronic kidney disease, stage 3 unspecified: Secondary | ICD-10-CM | POA: Diagnosis not present

## 2013-07-09 DIAGNOSIS — E039 Hypothyroidism, unspecified: Secondary | ICD-10-CM | POA: Diagnosis not present

## 2013-07-09 DIAGNOSIS — E1129 Type 2 diabetes mellitus with other diabetic kidney complication: Secondary | ICD-10-CM | POA: Diagnosis not present

## 2013-07-09 DIAGNOSIS — E785 Hyperlipidemia, unspecified: Secondary | ICD-10-CM | POA: Diagnosis not present

## 2013-07-11 DIAGNOSIS — E785 Hyperlipidemia, unspecified: Secondary | ICD-10-CM | POA: Diagnosis not present

## 2013-07-11 DIAGNOSIS — E8881 Metabolic syndrome: Secondary | ICD-10-CM | POA: Diagnosis not present

## 2013-07-11 DIAGNOSIS — N183 Chronic kidney disease, stage 3 unspecified: Secondary | ICD-10-CM | POA: Diagnosis not present

## 2013-07-11 DIAGNOSIS — Z794 Long term (current) use of insulin: Secondary | ICD-10-CM | POA: Diagnosis not present

## 2013-07-11 DIAGNOSIS — E1129 Type 2 diabetes mellitus with other diabetic kidney complication: Secondary | ICD-10-CM | POA: Diagnosis not present

## 2013-07-11 DIAGNOSIS — I10 Essential (primary) hypertension: Secondary | ICD-10-CM | POA: Diagnosis not present

## 2013-07-11 DIAGNOSIS — E039 Hypothyroidism, unspecified: Secondary | ICD-10-CM | POA: Diagnosis not present

## 2013-07-23 ENCOUNTER — Other Ambulatory Visit: Payer: Self-pay | Admitting: Interventional Cardiology

## 2013-07-24 NOTE — Telephone Encounter (Signed)
Ok to rf nitro? It it not on med list in epic or ecw.

## 2013-07-30 NOTE — Telephone Encounter (Signed)
Ok to fill per Dr. Irish Lack.

## 2013-08-21 DIAGNOSIS — J449 Chronic obstructive pulmonary disease, unspecified: Secondary | ICD-10-CM | POA: Diagnosis not present

## 2013-09-25 ENCOUNTER — Other Ambulatory Visit: Payer: Self-pay | Admitting: Family Medicine

## 2013-09-25 DIAGNOSIS — R0602 Shortness of breath: Secondary | ICD-10-CM

## 2013-09-26 ENCOUNTER — Encounter (INDEPENDENT_AMBULATORY_CARE_PROVIDER_SITE_OTHER): Payer: Self-pay

## 2013-09-26 ENCOUNTER — Ambulatory Visit (INDEPENDENT_AMBULATORY_CARE_PROVIDER_SITE_OTHER): Payer: Medicare Other | Admitting: Internal Medicine

## 2013-09-26 DIAGNOSIS — R0602 Shortness of breath: Secondary | ICD-10-CM | POA: Diagnosis not present

## 2013-09-26 LAB — PULMONARY FUNCTION TEST
DL/VA % pred: 96 %
DL/VA: 4.44 ml/min/mmHg/L
DLCO unc % pred: 62 %
DLCO unc: 20.13 ml/min/mmHg
FEF 25-75 Post: 0.85 L/sec
FEF 25-75 Pre: 0.67 L/sec
FEF2575-%Change-Post: 26 %
FEF2575-%Pred-Post: 41 %
FEF2575-%Pred-Pre: 32 %
FEV1-%Change-Post: 4 %
FEV1-%Pred-Post: 56 %
FEV1-%Pred-Pre: 54 %
FEV1-Post: 1.66 L
FEV1-Pre: 1.59 L
FEV1FVC-%Change-Post: -1 %
FEV1FVC-%Pred-Pre: 82 %
FEV6-%Change-Post: 6 %
FEV6-%Pred-Post: 71 %
FEV6-%Pred-Pre: 67 %
FEV6-Post: 2.76 L
FEV6-Pre: 2.58 L
FEV6FVC-%Change-Post: 0 %
FEV6FVC-%Pred-Post: 104 %
FEV6FVC-%Pred-Pre: 104 %
FVC-%Change-Post: 6 %
FVC-%Pred-Post: 69 %
FVC-%Pred-Pre: 65 %
FVC-Post: 2.84 L
FVC-Pre: 2.67 L
Post FEV1/FVC ratio: 58 %
Post FEV6/FVC ratio: 97 %
Pre FEV1/FVC ratio: 59 %
Pre FEV6/FVC Ratio: 97 %

## 2013-09-26 NOTE — Progress Notes (Signed)
PFT done today. 

## 2013-10-02 DIAGNOSIS — E785 Hyperlipidemia, unspecified: Secondary | ICD-10-CM | POA: Diagnosis not present

## 2013-10-02 DIAGNOSIS — E1129 Type 2 diabetes mellitus with other diabetic kidney complication: Secondary | ICD-10-CM | POA: Diagnosis not present

## 2013-10-02 DIAGNOSIS — R809 Proteinuria, unspecified: Secondary | ICD-10-CM | POA: Diagnosis not present

## 2013-10-02 DIAGNOSIS — D631 Anemia in chronic kidney disease: Secondary | ICD-10-CM | POA: Diagnosis not present

## 2013-10-02 DIAGNOSIS — N2581 Secondary hyperparathyroidism of renal origin: Secondary | ICD-10-CM | POA: Diagnosis not present

## 2013-10-02 DIAGNOSIS — N183 Chronic kidney disease, stage 3 unspecified: Secondary | ICD-10-CM | POA: Diagnosis not present

## 2013-10-02 DIAGNOSIS — N039 Chronic nephritic syndrome with unspecified morphologic changes: Secondary | ICD-10-CM | POA: Diagnosis not present

## 2013-10-02 DIAGNOSIS — I129 Hypertensive chronic kidney disease with stage 1 through stage 4 chronic kidney disease, or unspecified chronic kidney disease: Secondary | ICD-10-CM | POA: Diagnosis not present

## 2013-10-04 DIAGNOSIS — I10 Essential (primary) hypertension: Secondary | ICD-10-CM | POA: Diagnosis not present

## 2013-10-04 DIAGNOSIS — J449 Chronic obstructive pulmonary disease, unspecified: Secondary | ICD-10-CM | POA: Diagnosis not present

## 2013-10-14 DIAGNOSIS — E039 Hypothyroidism, unspecified: Secondary | ICD-10-CM | POA: Diagnosis not present

## 2013-10-14 DIAGNOSIS — E1129 Type 2 diabetes mellitus with other diabetic kidney complication: Secondary | ICD-10-CM | POA: Diagnosis not present

## 2013-10-14 DIAGNOSIS — E785 Hyperlipidemia, unspecified: Secondary | ICD-10-CM | POA: Diagnosis not present

## 2013-10-15 DIAGNOSIS — E1139 Type 2 diabetes mellitus with other diabetic ophthalmic complication: Secondary | ICD-10-CM | POA: Diagnosis not present

## 2013-10-15 DIAGNOSIS — E11359 Type 2 diabetes mellitus with proliferative diabetic retinopathy without macular edema: Secondary | ICD-10-CM | POA: Diagnosis not present

## 2013-10-16 DIAGNOSIS — I129 Hypertensive chronic kidney disease with stage 1 through stage 4 chronic kidney disease, or unspecified chronic kidney disease: Secondary | ICD-10-CM | POA: Diagnosis not present

## 2013-10-16 DIAGNOSIS — N183 Chronic kidney disease, stage 3 unspecified: Secondary | ICD-10-CM | POA: Diagnosis not present

## 2013-10-17 DIAGNOSIS — I10 Essential (primary) hypertension: Secondary | ICD-10-CM | POA: Diagnosis not present

## 2013-10-17 DIAGNOSIS — E1129 Type 2 diabetes mellitus with other diabetic kidney complication: Secondary | ICD-10-CM | POA: Diagnosis not present

## 2013-10-17 DIAGNOSIS — E785 Hyperlipidemia, unspecified: Secondary | ICD-10-CM | POA: Diagnosis not present

## 2013-10-17 DIAGNOSIS — Z794 Long term (current) use of insulin: Secondary | ICD-10-CM | POA: Diagnosis not present

## 2013-10-17 DIAGNOSIS — E8881 Metabolic syndrome: Secondary | ICD-10-CM | POA: Diagnosis not present

## 2013-10-17 DIAGNOSIS — N183 Chronic kidney disease, stage 3 unspecified: Secondary | ICD-10-CM | POA: Diagnosis not present

## 2013-10-17 DIAGNOSIS — E039 Hypothyroidism, unspecified: Secondary | ICD-10-CM | POA: Diagnosis not present

## 2013-11-08 DIAGNOSIS — N183 Chronic kidney disease, stage 3 unspecified: Secondary | ICD-10-CM | POA: Diagnosis not present

## 2013-12-06 ENCOUNTER — Ambulatory Visit (INDEPENDENT_AMBULATORY_CARE_PROVIDER_SITE_OTHER): Payer: Medicare Other | Admitting: Interventional Cardiology

## 2013-12-06 ENCOUNTER — Encounter: Payer: Self-pay | Admitting: Interventional Cardiology

## 2013-12-06 VITALS — BP 153/60 | HR 70 | Ht 71.0 in | Wt 251.0 lb

## 2013-12-06 DIAGNOSIS — R9430 Abnormal result of cardiovascular function study, unspecified: Secondary | ICD-10-CM | POA: Insufficient documentation

## 2013-12-06 DIAGNOSIS — I10 Essential (primary) hypertension: Secondary | ICD-10-CM | POA: Insufficient documentation

## 2013-12-06 DIAGNOSIS — E782 Mixed hyperlipidemia: Secondary | ICD-10-CM | POA: Diagnosis not present

## 2013-12-06 DIAGNOSIS — I2510 Atherosclerotic heart disease of native coronary artery without angina pectoris: Secondary | ICD-10-CM | POA: Diagnosis not present

## 2013-12-06 DIAGNOSIS — I5032 Chronic diastolic (congestive) heart failure: Secondary | ICD-10-CM | POA: Insufficient documentation

## 2013-12-06 DIAGNOSIS — I5033 Acute on chronic diastolic (congestive) heart failure: Secondary | ICD-10-CM | POA: Insufficient documentation

## 2013-12-06 MED ORDER — AMLODIPINE BESYLATE 10 MG PO TABS: ORAL_TABLET | ORAL | Status: DC

## 2013-12-06 NOTE — Progress Notes (Signed)
Patient ID: Luke [name], male   DOB: 13-Sep-1932, 80 y.o.   MRN: [id number]    [place], [address] [place], [place]  [address] Phone: [phone] Fax:  [phone]  Date:  12/06/2013   ID:  Luke [name], DOB Jan 23, 1933, MRN [id number]  PCP:  Milagros Evener, MD      History of Present Illness: Luke [name] is a 80 y.o. male who has had CAD in the past. No further chest pain. Nothing like what he had before stent or CABG. He has been taking furosemide to diurese. Breathing is somewhat better. He has been coughing more lately. He has not been exercising as much. Increase change in weight recently. CAD/ASCVD:  c/o Leg edema improved.  Denies : Chest pain But has not been exercising as much.  Dizziness.  Dyspnea on exertion.  Nitroglycerin.  Orthopnea.  Palpitations.  Paroxysmal nocturnal dyspnea.  Syncope.   Atherosclerotic heart disease with myocardial infarction 1999; S/P CABG x3 in 1999 (RIMA to LAD, LIMA to OM, SVG to PDA); S/P drug-eluting coronary stent to LAD in 3/09 Lendell Caprice, MD).    Wt Readings from Last 3 Encounters:  12/06/13 251 lb (113.853 kg)  03/19/13 225 lb 5 oz (102.2 kg)     Past Medical History  Diagnosis Date  . Coronary artery disease   . Hypertension   . Hyperlipemia   . Anginal pain 02/08/12  . Exertional dyspnea     "doesn't take much these days"  . Type II diabetes mellitus   . Hypothyroidism   . Stroke 09/2010    denies residual  . H/O hiatal hernia   . Arthritis     "all over"    Current Outpatient Prescriptions  Medication Sig Dispense Refill  . amLODipine (NORVASC) 10 MG tablet 1 tablet daily.  30 tablet  6  . Aromatic Inhalants (VICKS VAPOINHALER IN) Inhale 1 Inhaler into the lungs daily as needed (For congestion).      Marland Kitchen atenolol (TENORMIN) 25 MG tablet Take 25 mg by mouth daily.      Marland Kitchen atorvastatin (LIPITOR) 20 MG tablet Take 20 mg by mouth daily.      . furosemide (LASIX) 80 MG tablet 1 to 2 tabs daily      .  insulin aspart (NOVOLOG) 100 UNIT/ML injection Inject 10 Units into the skin 3 (three) times daily with meals.       . insulin detemir (LEVEMIR) 100 UNIT/ML injection Inject 13 Units into the skin 2 (two) times daily.       Marland Kitchen levothyroxine (SYNTHROID, LEVOTHROID) 75 MCG tablet Take 75 mcg by mouth daily.      . nitroGLYCERIN (NITROSTAT) 0.4 MG SL tablet prn      . omeprazole (PRILOSEC) 40 MG capsule Take 40 mg by mouth daily.      . polyvinyl alcohol (LIQUIFILM TEARS) 1.4 % ophthalmic solution Place 1 drop into both eyes as needed (For dry eyes).       No current facility-administered medications for this visit.    Allergies:   No Known Allergies  Social History:  The patient  reports that he quit smoking about 20 years ago. His smoking use included Cigarettes. He has a 70 pack-year smoking history. He quit smokeless tobacco use about 18 years ago. He reports that he does not drink alcohol or use illicit drugs.   Family History:  The patient's family history includes Heart attack in his mother.   ROS:  Please see the history of  present illness.  No nausea, vomiting.  No fevers, chills.  No focal weakness.  No dysuria. Knee pain.  All other systems reviewed and negative.   PHYSICAL EXAM: VS:  BP 153/60  Pulse 70  Ht 5\' 11"  (1.803 m)  Wt 251 lb (113.853 kg)  BMI 35.02 kg/m2 Well nourished, well developed, in no acute distress HEENT: normal Neck: no JVD, no carotid bruits Cardiac:  normal S1, S2; RRR;  Lungs:  clear to auscultation bilaterally, no wheezing, rhonchi or rales Abd: soft, nontender, obese Ext: no edema Skin: warm and dry Neuro:   no focal abnormalities noted  EKG:  NSR, anterior MI, no ST changes in 8/14.     ASSESSMENT AND PLAN:  Coronary atherosclerosis of native coronary artery  Continue Aspirin EC, 81 mg, 1 tablet, once a day IMAGING: EKG   Harward,Amy 12/07/2012 10:50:21 AM > Karalee Hauter,JAY 12/07/2012 11:28:54 AM > NSR, prolonged PR, no ST segment changes    Notes: No angina.  2. Hypercholesteremia, pure  Continue Atorvastatin Calcium Tablet, 20 MG, 1 tablet, Orally, Once a day Notes: Needs lipids checked.  3. Nonspecific abnormal unspecified cardiovascular function study  Notes: Mild lateral wall defect in 2013. No angina. No indication for for cath at this time. If sx return, let us know.  added imdur.  No angina, but not much activity. 4. Chronic diastolic heart failure  Continue Furosemide Tablet, 40 MG, 2 tablets in the morning Notes: Some leg edema. Elevate legs at night.   5. HTN: Elevated.  Increase amlodipine to 10 mg daily.  Weight loss will help BP.  Stressed importance of exercise.  I have asked him to get his weight down to 230 pounds in 6 months when I see him next.   Signed, Mina Marble, MD, [facility] 12/06/2013 1:29 PM

## 2013-12-06 NOTE — Patient Instructions (Signed)
Your physician has recommended you make the following change in your medication:   1. Increase Amlodipine to 10 mg daily.   Your physician has requested that you regularly monitor and record your blood pressure readings at home. Please use the same machine at the same time of day to check your readings and record them. Call if Bp is consistently above 150's ( the top number).   Your physician wants you to follow-up in: 6 months with Dr. Irish Lack. You will receive a reminder letter in the mail two months in advance. If you don't receive a letter, please call our office to schedule the follow-up appointment.

## 2014-01-02 DIAGNOSIS — R809 Proteinuria, unspecified: Secondary | ICD-10-CM | POA: Diagnosis not present

## 2014-01-02 DIAGNOSIS — N183 Chronic kidney disease, stage 3 unspecified: Secondary | ICD-10-CM | POA: Diagnosis not present

## 2014-01-02 DIAGNOSIS — D631 Anemia in chronic kidney disease: Secondary | ICD-10-CM | POA: Diagnosis not present

## 2014-01-02 DIAGNOSIS — I129 Hypertensive chronic kidney disease with stage 1 through stage 4 chronic kidney disease, or unspecified chronic kidney disease: Secondary | ICD-10-CM | POA: Diagnosis not present

## 2014-01-27 DIAGNOSIS — E8881 Metabolic syndrome: Secondary | ICD-10-CM | POA: Diagnosis not present

## 2014-01-27 DIAGNOSIS — N183 Chronic kidney disease, stage 3 unspecified: Secondary | ICD-10-CM | POA: Diagnosis not present

## 2014-01-27 DIAGNOSIS — E039 Hypothyroidism, unspecified: Secondary | ICD-10-CM | POA: Diagnosis not present

## 2014-01-27 DIAGNOSIS — E785 Hyperlipidemia, unspecified: Secondary | ICD-10-CM | POA: Diagnosis not present

## 2014-01-27 DIAGNOSIS — E1165 Type 2 diabetes mellitus with hyperglycemia: Secondary | ICD-10-CM | POA: Diagnosis not present

## 2014-01-27 DIAGNOSIS — E1129 Type 2 diabetes mellitus with other diabetic kidney complication: Secondary | ICD-10-CM | POA: Diagnosis not present

## 2014-01-27 DIAGNOSIS — Z794 Long term (current) use of insulin: Secondary | ICD-10-CM | POA: Diagnosis not present

## 2014-01-27 DIAGNOSIS — I10 Essential (primary) hypertension: Secondary | ICD-10-CM | POA: Diagnosis not present

## 2014-02-18 DIAGNOSIS — E785 Hyperlipidemia, unspecified: Secondary | ICD-10-CM | POA: Diagnosis not present

## 2014-02-18 DIAGNOSIS — E039 Hypothyroidism, unspecified: Secondary | ICD-10-CM | POA: Diagnosis not present

## 2014-02-18 DIAGNOSIS — E1129 Type 2 diabetes mellitus with other diabetic kidney complication: Secondary | ICD-10-CM | POA: Diagnosis not present

## 2014-02-18 DIAGNOSIS — I2510 Atherosclerotic heart disease of native coronary artery without angina pectoris: Secondary | ICD-10-CM | POA: Diagnosis not present

## 2014-02-18 DIAGNOSIS — E1165 Type 2 diabetes mellitus with hyperglycemia: Secondary | ICD-10-CM | POA: Diagnosis not present

## 2014-02-18 DIAGNOSIS — I10 Essential (primary) hypertension: Secondary | ICD-10-CM | POA: Diagnosis not present

## 2014-02-18 DIAGNOSIS — N183 Chronic kidney disease, stage 3 unspecified: Secondary | ICD-10-CM | POA: Diagnosis not present

## 2014-02-18 DIAGNOSIS — Z91199 Patient's noncompliance with other medical treatment and regimen due to unspecified reason: Secondary | ICD-10-CM | POA: Diagnosis not present

## 2014-02-18 DIAGNOSIS — Z9119 Patient's noncompliance with other medical treatment and regimen: Secondary | ICD-10-CM | POA: Diagnosis not present

## 2014-02-18 DIAGNOSIS — Z23 Encounter for immunization: Secondary | ICD-10-CM | POA: Diagnosis not present

## 2014-02-25 DIAGNOSIS — I2510 Atherosclerotic heart disease of native coronary artery without angina pectoris: Secondary | ICD-10-CM | POA: Diagnosis not present

## 2014-02-25 DIAGNOSIS — I129 Hypertensive chronic kidney disease with stage 1 through stage 4 chronic kidney disease, or unspecified chronic kidney disease: Secondary | ICD-10-CM | POA: Diagnosis not present

## 2014-02-25 DIAGNOSIS — Z794 Long term (current) use of insulin: Secondary | ICD-10-CM | POA: Diagnosis not present

## 2014-02-25 DIAGNOSIS — J449 Chronic obstructive pulmonary disease, unspecified: Secondary | ICD-10-CM | POA: Diagnosis not present

## 2014-02-25 DIAGNOSIS — N183 Chronic kidney disease, stage 3 unspecified: Secondary | ICD-10-CM | POA: Diagnosis not present

## 2014-02-25 DIAGNOSIS — E1129 Type 2 diabetes mellitus with other diabetic kidney complication: Secondary | ICD-10-CM | POA: Diagnosis not present

## 2014-02-27 DIAGNOSIS — J449 Chronic obstructive pulmonary disease, unspecified: Secondary | ICD-10-CM | POA: Diagnosis not present

## 2014-02-27 DIAGNOSIS — E1129 Type 2 diabetes mellitus with other diabetic kidney complication: Secondary | ICD-10-CM | POA: Diagnosis not present

## 2014-02-27 DIAGNOSIS — N183 Chronic kidney disease, stage 3 unspecified: Secondary | ICD-10-CM | POA: Diagnosis not present

## 2014-02-27 DIAGNOSIS — I129 Hypertensive chronic kidney disease with stage 1 through stage 4 chronic kidney disease, or unspecified chronic kidney disease: Secondary | ICD-10-CM | POA: Diagnosis not present

## 2014-02-27 DIAGNOSIS — I2510 Atherosclerotic heart disease of native coronary artery without angina pectoris: Secondary | ICD-10-CM | POA: Diagnosis not present

## 2014-02-27 DIAGNOSIS — Z794 Long term (current) use of insulin: Secondary | ICD-10-CM | POA: Diagnosis not present

## 2014-03-05 DIAGNOSIS — I2510 Atherosclerotic heart disease of native coronary artery without angina pectoris: Secondary | ICD-10-CM | POA: Diagnosis not present

## 2014-03-05 DIAGNOSIS — E1129 Type 2 diabetes mellitus with other diabetic kidney complication: Secondary | ICD-10-CM | POA: Diagnosis not present

## 2014-03-05 DIAGNOSIS — Z794 Long term (current) use of insulin: Secondary | ICD-10-CM | POA: Diagnosis not present

## 2014-03-05 DIAGNOSIS — N183 Chronic kidney disease, stage 3 unspecified: Secondary | ICD-10-CM | POA: Diagnosis not present

## 2014-03-05 DIAGNOSIS — E1165 Type 2 diabetes mellitus with hyperglycemia: Secondary | ICD-10-CM | POA: Diagnosis not present

## 2014-03-05 DIAGNOSIS — I129 Hypertensive chronic kidney disease with stage 1 through stage 4 chronic kidney disease, or unspecified chronic kidney disease: Secondary | ICD-10-CM | POA: Diagnosis not present

## 2014-03-05 DIAGNOSIS — J449 Chronic obstructive pulmonary disease, unspecified: Secondary | ICD-10-CM | POA: Diagnosis not present

## 2014-03-06 ENCOUNTER — Other Ambulatory Visit: Payer: Self-pay

## 2014-03-06 MED ORDER — AMLODIPINE BESYLATE 10 MG PO TABS: ORAL_TABLET | ORAL | Status: DC

## 2014-03-12 DIAGNOSIS — N183 Chronic kidney disease, stage 3 unspecified: Secondary | ICD-10-CM | POA: Diagnosis not present

## 2014-03-12 DIAGNOSIS — I2510 Atherosclerotic heart disease of native coronary artery without angina pectoris: Secondary | ICD-10-CM | POA: Diagnosis not present

## 2014-03-12 DIAGNOSIS — E1129 Type 2 diabetes mellitus with other diabetic kidney complication: Secondary | ICD-10-CM | POA: Diagnosis not present

## 2014-03-12 DIAGNOSIS — E1165 Type 2 diabetes mellitus with hyperglycemia: Secondary | ICD-10-CM | POA: Diagnosis not present

## 2014-03-12 DIAGNOSIS — Z794 Long term (current) use of insulin: Secondary | ICD-10-CM | POA: Diagnosis not present

## 2014-03-12 DIAGNOSIS — I129 Hypertensive chronic kidney disease with stage 1 through stage 4 chronic kidney disease, or unspecified chronic kidney disease: Secondary | ICD-10-CM | POA: Diagnosis not present

## 2014-03-12 DIAGNOSIS — J449 Chronic obstructive pulmonary disease, unspecified: Secondary | ICD-10-CM | POA: Diagnosis not present

## 2014-03-20 DIAGNOSIS — I129 Hypertensive chronic kidney disease with stage 1 through stage 4 chronic kidney disease, or unspecified chronic kidney disease: Secondary | ICD-10-CM | POA: Diagnosis not present

## 2014-03-20 DIAGNOSIS — J449 Chronic obstructive pulmonary disease, unspecified: Secondary | ICD-10-CM | POA: Diagnosis not present

## 2014-03-20 DIAGNOSIS — E1129 Type 2 diabetes mellitus with other diabetic kidney complication: Secondary | ICD-10-CM | POA: Diagnosis not present

## 2014-03-20 DIAGNOSIS — Z794 Long term (current) use of insulin: Secondary | ICD-10-CM | POA: Diagnosis not present

## 2014-03-20 DIAGNOSIS — I2510 Atherosclerotic heart disease of native coronary artery without angina pectoris: Secondary | ICD-10-CM | POA: Diagnosis not present

## 2014-03-20 DIAGNOSIS — E1165 Type 2 diabetes mellitus with hyperglycemia: Secondary | ICD-10-CM | POA: Diagnosis not present

## 2014-03-20 DIAGNOSIS — N183 Chronic kidney disease, stage 3 unspecified: Secondary | ICD-10-CM | POA: Diagnosis not present

## 2014-03-26 DIAGNOSIS — E1129 Type 2 diabetes mellitus with other diabetic kidney complication: Secondary | ICD-10-CM | POA: Diagnosis not present

## 2014-03-26 DIAGNOSIS — I2510 Atherosclerotic heart disease of native coronary artery without angina pectoris: Secondary | ICD-10-CM | POA: Diagnosis not present

## 2014-03-26 DIAGNOSIS — Z794 Long term (current) use of insulin: Secondary | ICD-10-CM | POA: Diagnosis not present

## 2014-03-26 DIAGNOSIS — N183 Chronic kidney disease, stage 3 unspecified: Secondary | ICD-10-CM | POA: Diagnosis not present

## 2014-03-26 DIAGNOSIS — I129 Hypertensive chronic kidney disease with stage 1 through stage 4 chronic kidney disease, or unspecified chronic kidney disease: Secondary | ICD-10-CM | POA: Diagnosis not present

## 2014-03-26 DIAGNOSIS — J449 Chronic obstructive pulmonary disease, unspecified: Secondary | ICD-10-CM | POA: Diagnosis not present

## 2014-03-28 DIAGNOSIS — E1165 Type 2 diabetes mellitus with hyperglycemia: Secondary | ICD-10-CM | POA: Diagnosis not present

## 2014-03-28 DIAGNOSIS — E039 Hypothyroidism, unspecified: Secondary | ICD-10-CM | POA: Diagnosis not present

## 2014-03-28 DIAGNOSIS — E785 Hyperlipidemia, unspecified: Secondary | ICD-10-CM | POA: Diagnosis not present

## 2014-03-28 DIAGNOSIS — E1129 Type 2 diabetes mellitus with other diabetic kidney complication: Secondary | ICD-10-CM | POA: Diagnosis not present

## 2014-04-01 DIAGNOSIS — E1165 Type 2 diabetes mellitus with hyperglycemia: Secondary | ICD-10-CM | POA: Diagnosis not present

## 2014-04-01 DIAGNOSIS — E1129 Type 2 diabetes mellitus with other diabetic kidney complication: Secondary | ICD-10-CM | POA: Diagnosis not present

## 2014-04-01 DIAGNOSIS — E039 Hypothyroidism, unspecified: Secondary | ICD-10-CM | POA: Diagnosis not present

## 2014-04-01 DIAGNOSIS — Z794 Long term (current) use of insulin: Secondary | ICD-10-CM | POA: Diagnosis not present

## 2014-04-01 DIAGNOSIS — I10 Essential (primary) hypertension: Secondary | ICD-10-CM | POA: Diagnosis not present

## 2014-04-01 DIAGNOSIS — E785 Hyperlipidemia, unspecified: Secondary | ICD-10-CM | POA: Diagnosis not present

## 2014-04-01 DIAGNOSIS — N183 Chronic kidney disease, stage 3 unspecified: Secondary | ICD-10-CM | POA: Diagnosis not present

## 2014-04-01 DIAGNOSIS — E8881 Metabolic syndrome: Secondary | ICD-10-CM | POA: Diagnosis not present

## 2014-04-15 DIAGNOSIS — E11349 Type 2 diabetes mellitus with severe nonproliferative diabetic retinopathy without macular edema: Secondary | ICD-10-CM | POA: Diagnosis not present

## 2014-04-15 DIAGNOSIS — H35379 Puckering of macula, unspecified eye: Secondary | ICD-10-CM | POA: Diagnosis not present

## 2014-04-15 DIAGNOSIS — E1139 Type 2 diabetes mellitus with other diabetic ophthalmic complication: Secondary | ICD-10-CM | POA: Diagnosis not present

## 2014-04-15 DIAGNOSIS — H43819 Vitreous degeneration, unspecified eye: Secondary | ICD-10-CM | POA: Diagnosis not present

## 2014-04-15 DIAGNOSIS — E11359 Type 2 diabetes mellitus with proliferative diabetic retinopathy without macular edema: Secondary | ICD-10-CM | POA: Diagnosis not present

## 2014-04-22 DIAGNOSIS — Z23 Encounter for immunization: Secondary | ICD-10-CM | POA: Diagnosis not present

## 2014-04-22 DIAGNOSIS — I129 Hypertensive chronic kidney disease with stage 1 through stage 4 chronic kidney disease, or unspecified chronic kidney disease: Secondary | ICD-10-CM | POA: Diagnosis not present

## 2014-04-22 DIAGNOSIS — N183 Chronic kidney disease, stage 3 unspecified: Secondary | ICD-10-CM | POA: Diagnosis not present

## 2014-04-22 DIAGNOSIS — R809 Proteinuria, unspecified: Secondary | ICD-10-CM | POA: Diagnosis not present

## 2014-04-22 DIAGNOSIS — N2581 Secondary hyperparathyroidism of renal origin: Secondary | ICD-10-CM | POA: Diagnosis not present

## 2014-04-22 DIAGNOSIS — D631 Anemia in chronic kidney disease: Secondary | ICD-10-CM | POA: Diagnosis not present

## 2014-05-06 DIAGNOSIS — I129 Hypertensive chronic kidney disease with stage 1 through stage 4 chronic kidney disease, or unspecified chronic kidney disease: Secondary | ICD-10-CM | POA: Diagnosis not present

## 2014-05-06 DIAGNOSIS — N183 Chronic kidney disease, stage 3 (moderate): Secondary | ICD-10-CM | POA: Diagnosis not present

## 2014-05-20 DIAGNOSIS — I129 Hypertensive chronic kidney disease with stage 1 through stage 4 chronic kidney disease, or unspecified chronic kidney disease: Secondary | ICD-10-CM | POA: Diagnosis not present

## 2014-05-20 DIAGNOSIS — N183 Chronic kidney disease, stage 3 (moderate): Secondary | ICD-10-CM | POA: Diagnosis not present

## 2014-05-26 DIAGNOSIS — I10 Essential (primary) hypertension: Secondary | ICD-10-CM | POA: Diagnosis not present

## 2014-05-26 DIAGNOSIS — E1121 Type 2 diabetes mellitus with diabetic nephropathy: Secondary | ICD-10-CM | POA: Diagnosis not present

## 2014-05-26 DIAGNOSIS — E785 Hyperlipidemia, unspecified: Secondary | ICD-10-CM | POA: Diagnosis not present

## 2014-05-26 DIAGNOSIS — N183 Chronic kidney disease, stage 3 (moderate): Secondary | ICD-10-CM | POA: Diagnosis not present

## 2014-05-26 DIAGNOSIS — E039 Hypothyroidism, unspecified: Secondary | ICD-10-CM | POA: Diagnosis not present

## 2014-05-26 DIAGNOSIS — Z23 Encounter for immunization: Secondary | ICD-10-CM | POA: Diagnosis not present

## 2014-06-09 ENCOUNTER — Encounter: Payer: Self-pay | Admitting: Interventional Cardiology

## 2014-06-09 ENCOUNTER — Ambulatory Visit (INDEPENDENT_AMBULATORY_CARE_PROVIDER_SITE_OTHER): Payer: Medicare Other | Admitting: Interventional Cardiology

## 2014-06-09 VITALS — BP 130/68 | HR 73 | Ht 71.0 in | Wt 247.0 lb

## 2014-06-09 DIAGNOSIS — E663 Overweight: Secondary | ICD-10-CM | POA: Insufficient documentation

## 2014-06-09 DIAGNOSIS — I2510 Atherosclerotic heart disease of native coronary artery without angina pectoris: Secondary | ICD-10-CM

## 2014-06-09 DIAGNOSIS — E782 Mixed hyperlipidemia: Secondary | ICD-10-CM

## 2014-06-09 DIAGNOSIS — I5032 Chronic diastolic (congestive) heart failure: Secondary | ICD-10-CM | POA: Diagnosis not present

## 2014-06-09 DIAGNOSIS — N189 Chronic kidney disease, unspecified: Secondary | ICD-10-CM | POA: Diagnosis not present

## 2014-06-09 MED ORDER — AMLODIPINE BESYLATE 10 MG PO TABS: ORAL_TABLET | ORAL | Status: DC

## 2014-06-09 NOTE — Patient Instructions (Signed)
AMLODIPINE WAS REFILL TODAY FOR YOU  Your physician wants you to follow-up in: [place] DR. VARANASI You will receive a reminder letter in the mail two months in advance. If you don't receive a letter, please call our office to schedule the follow-up appointment.

## 2014-06-09 NOTE — Progress Notes (Signed)
Patient ID: Luke [name], male   DOB: [birth year], 80 y.o.   MRN: [id number] Patient ID: Luke [name], male   DOB: Aug 28, 1932, 80 y.o.   MRN: [id number]    [place], [address] [place], [place]  [address] Phone: [phone] Fax:  [phone]  Date:  06/09/2014   ID:  Luke [name], DOB [birth year], MRN [id number]  PCP:  Milagros Evener, MD      History of Present Illness: Luke [name] is a 80 y.o. male who has had CAD in the past, with CABG in 9/99 at [facility], [place]; LAD stent in 3/09. Occasional chest pain- lasts a short time; occasionally takes a NTG. Not like what he had before stent or CABG- but since he is not sure, he just takes a pill anyway.   He has been taking furosemide to diurese. Breathing is somewhat better. He has been coughing more lately. He has not been exercising as much. Decrease in weight recently; about 9 lbs lost- more exercise- eating less. CAD/ASCVD:  c/o Leg edema- not elevating legs.  Denies : Chest pain But has not been exercising as much.  Dizziness.  Dyspnea on exertion.  Orthopnea.  Palpitations.  Paroxysmal nocturnal dyspnea.  Syncope.   Atherosclerotic heart disease with myocardial infarction 1999; S/P CABG x3 in 1999 (RIMA to LAD, LIMA to OM, SVG to PDA); S/P drug-eluting coronary stent to LAD in 3/09 Luke Caprice, MD).  Luke [name] is most strenuous activity.  No angina or NTG use when doing these activities.    Wt Readings from Last 3 Encounters:  06/09/14 247 lb (112.038 kg)  12/06/13 251 lb (113.853 kg)  03/19/13 225 lb 5 oz (102.2 kg)     Past Medical History  Diagnosis Date  . Coronary artery disease   . Hypertension   . Hyperlipemia   . Anginal pain 02/08/12  . Exertional dyspnea     "doesn't take much these days"  . Type II diabetes mellitus   . Hypothyroidism   . Stroke 09/2010    denies residual  . H/O hiatal hernia   . Arthritis     "all over"    Current Outpatient Prescriptions  Medication  Sig Dispense Refill  . amLODipine (NORVASC) 10 MG tablet 1 tablet daily. 30 tablet 1  . Aromatic Inhalants (VICKS VAPOINHALER IN) Inhale 1 Inhaler into the lungs daily as needed (For congestion).    Marland Kitchen atenolol (TENORMIN) 25 MG tablet Take 25 mg by mouth daily.    Marland Kitchen atorvastatin (LIPITOR) 20 MG tablet Take 20 mg by mouth daily.    . furosemide (LASIX) 80 MG tablet 1 to 2 tabs daily    . insulin aspart (NOVOLOG) 100 UNIT/ML injection Inject 13 Units into the skin 3 (three) times daily with meals.     . insulin detemir (LEVEMIR) 100 UNIT/ML injection Inject 18 Units into the skin 2 (two) times daily.     Marland Kitchen levothyroxine (SYNTHROID, LEVOTHROID) 75 MCG tablet Take 75 mcg by mouth daily.    . nitroGLYCERIN (NITROSTAT) 0.4 MG SL tablet prn    . omeprazole (PRILOSEC) 40 MG capsule Take 40 mg by mouth daily.    . polyvinyl alcohol (LIQUIFILM TEARS) 1.4 % ophthalmic solution Place 1 drop into both eyes as needed (For dry eyes).     No current facility-administered medications for this visit.    Allergies:   No Known Allergies  Social History:  The patient  reports that he quit smoking about 20 years ago. His  smoking use included Cigarettes. He has a 70 pack-year smoking history. He quit smokeless tobacco use about 18 years ago. He reports that he does not drink alcohol or use illicit drugs.   Family History:  The patient's family history includes Heart attack in his mother.   ROS:  Please see the history of present illness.  No nausea, vomiting.  No fevers, chills.  No focal weakness.  No dysuria. Knee pain.  All other systems reviewed and negative.   PHYSICAL EXAM: VS:  BP 130/68 mmHg  Pulse 73  Ht 5\' 11"  (1.803 m)  Wt 247 lb (112.038 kg)  BMI 34.46 kg/m2 Well nourished, well developed, in no acute distress HEENT: normal Neck: no JVD, no carotid bruits Cardiac:  normal S1, S2; RRR;  Lungs:  clear to auscultation bilaterally, no wheezing, rhonchi or rales Abd: soft, nontender, obese Ext: no  edema Skin: warm and dry Neuro:   no focal abnormalities noted Psych Normal affect  EKG:  NSR, anterior MI, no ST changes in 8/14.     ASSESSMENT AND PLAN:  Coronary atherosclerosis of native coronary artery  Restart Aspirin EC, 81 mg, 1 tablet, once a day     Forgot to take aspirin.  No clear  Angina.  If NTG use becomes more frequent, he will let us know.   No bleeding problems.   2. Hypercholesteremia, pure  Continue Atorvastatin Calcium Tablet, 20 MG, 1 tablet, Orally, Once a day Notes: Needs lipids checked.  Most recent labs are pending. 3. Nonspecific abnormal unspecified cardiovascular function study  Notes: Mild lateral wall defect in 2013. Mild discomfort-infrequent. No indication for for cath at this time. If sx return, let us know.  added imdur in the past, but now off.  No angina, but not much activity. 4. Chronic diastolic heart failure  Continue Furosemide Tablet, 40 MG, 2 tablets in the morning and night Notes: Some leg edema. Elevate legs at night. No SHOB. Appears euvolemic.   5. HTN: Reading controlled today.  Increased amlodipine to 10 mg daily.  Will refill.  Weight loss will help BP-diet is most imprtant for him for weight loss.  Stressed importance of exercise and diet control.  I have asked him to get his weight down to 225 pounds in 6 months.   CRI: follows with Dr. Jimmy Footman for CRI.  Labs checked at [facility].  Another reason to try to hold off on cath.   Signed, Mina Marble, MD, [facility] 06/09/2014 3:15 PM

## 2014-06-12 ENCOUNTER — Other Ambulatory Visit: Payer: Self-pay | Admitting: *Deleted

## 2014-06-12 DIAGNOSIS — I2510 Atherosclerotic heart disease of native coronary artery without angina pectoris: Secondary | ICD-10-CM

## 2014-06-12 MED ORDER — AMLODIPINE BESYLATE 10 MG PO TABS: ORAL_TABLET | ORAL | Status: DC

## 2014-07-01 DIAGNOSIS — E785 Hyperlipidemia, unspecified: Secondary | ICD-10-CM | POA: Diagnosis not present

## 2014-07-01 DIAGNOSIS — E038 Other specified hypothyroidism: Secondary | ICD-10-CM | POA: Diagnosis not present

## 2014-07-01 DIAGNOSIS — E1165 Type 2 diabetes mellitus with hyperglycemia: Secondary | ICD-10-CM | POA: Diagnosis not present

## 2014-07-04 DIAGNOSIS — E1122 Type 2 diabetes mellitus with diabetic chronic kidney disease: Secondary | ICD-10-CM | POA: Diagnosis not present

## 2014-07-04 DIAGNOSIS — N183 Chronic kidney disease, stage 3 (moderate): Secondary | ICD-10-CM | POA: Diagnosis not present

## 2014-07-04 DIAGNOSIS — I10 Essential (primary) hypertension: Secondary | ICD-10-CM | POA: Diagnosis not present

## 2014-07-04 DIAGNOSIS — E1165 Type 2 diabetes mellitus with hyperglycemia: Secondary | ICD-10-CM | POA: Diagnosis not present

## 2014-07-04 DIAGNOSIS — E038 Other specified hypothyroidism: Secondary | ICD-10-CM | POA: Diagnosis not present

## 2014-07-04 DIAGNOSIS — Z794 Long term (current) use of insulin: Secondary | ICD-10-CM | POA: Diagnosis not present

## 2014-07-04 DIAGNOSIS — E782 Mixed hyperlipidemia: Secondary | ICD-10-CM | POA: Diagnosis not present

## 2014-09-10 ENCOUNTER — Emergency Department (HOSPITAL_COMMUNITY)
Admission: EM | Admit: 2014-09-10 | Discharge: 2014-09-10 | Disposition: A | Discharge status: Home / Self Care | Payer: Medicare Other | Attending: Emergency Medicine | Admitting: Emergency Medicine

## 2014-09-10 ENCOUNTER — Encounter (HOSPITAL_COMMUNITY): Payer: Self-pay | Admitting: Family Medicine

## 2014-09-10 ENCOUNTER — Emergency Department (HOSPITAL_COMMUNITY): Payer: Medicare Other

## 2014-09-10 DIAGNOSIS — I10 Essential (primary) hypertension: Secondary | ICD-10-CM | POA: Diagnosis not present

## 2014-09-10 DIAGNOSIS — Z951 Presence of aortocoronary bypass graft: Secondary | ICD-10-CM | POA: Diagnosis not present

## 2014-09-10 DIAGNOSIS — R072 Precordial pain: Secondary | ICD-10-CM | POA: Diagnosis not present

## 2014-09-10 DIAGNOSIS — M1990 Unspecified osteoarthritis, unspecified site: Secondary | ICD-10-CM | POA: Insufficient documentation

## 2014-09-10 DIAGNOSIS — Z9861 Coronary angioplasty status: Secondary | ICD-10-CM | POA: Diagnosis not present

## 2014-09-10 DIAGNOSIS — Z79899 Other long term (current) drug therapy: Secondary | ICD-10-CM | POA: Insufficient documentation

## 2014-09-10 DIAGNOSIS — Z8673 Personal history of transient ischemic attack (TIA), and cerebral infarction without residual deficits: Secondary | ICD-10-CM | POA: Diagnosis not present

## 2014-09-10 DIAGNOSIS — R079 Chest pain, unspecified: Secondary | ICD-10-CM | POA: Diagnosis not present

## 2014-09-10 DIAGNOSIS — E785 Hyperlipidemia, unspecified: Secondary | ICD-10-CM | POA: Diagnosis not present

## 2014-09-10 DIAGNOSIS — Z87891 Personal history of nicotine dependence: Secondary | ICD-10-CM | POA: Insufficient documentation

## 2014-09-10 DIAGNOSIS — I25119 Atherosclerotic heart disease of native coronary artery with unspecified angina pectoris: Secondary | ICD-10-CM | POA: Diagnosis not present

## 2014-09-10 DIAGNOSIS — Z8719 Personal history of other diseases of the digestive system: Secondary | ICD-10-CM | POA: Insufficient documentation

## 2014-09-10 DIAGNOSIS — E119 Type 2 diabetes mellitus without complications: Secondary | ICD-10-CM | POA: Insufficient documentation

## 2014-09-10 DIAGNOSIS — Z794 Long term (current) use of insulin: Secondary | ICD-10-CM | POA: Diagnosis not present

## 2014-09-10 DIAGNOSIS — E039 Hypothyroidism, unspecified: Secondary | ICD-10-CM | POA: Diagnosis not present

## 2014-09-10 LAB — BASIC METABOLIC PANEL
Anion gap: 12 (ref 5–15)
Anion gap: 9 (ref 5–15)
BUN: 31 mg/dL — ABNORMAL HIGH (ref 6–23)
BUN: 31 mg/dL — ABNORMAL HIGH (ref 6–23)
CO2: 27 mmol/L (ref 19–32)
CO2: 32 mmol/L (ref 19–32)
Calcium: 8.7 mg/dL (ref 8.4–10.5)
Calcium: 9 mg/dL (ref 8.4–10.5)
Chloride: 99 mmol/L (ref 96–112)
Chloride: 99 mmol/L (ref 96–112)
Creatinine, Ser: 1.57 mg/dL — ABNORMAL HIGH (ref 0.50–1.35)
Creatinine, Ser: 1.6 mg/dL — ABNORMAL HIGH (ref 0.50–1.35)
GFR calc Af Amer: 47 mL/min — ABNORMAL LOW (ref >90)
GFR calc Af Amer: 46 mL/min — ABNORMAL LOW (ref >90)
GFR calc non Af Amer: 40 mL/min — ABNORMAL LOW (ref >90)
GFR calc non Af Amer: 39 mL/min — ABNORMAL LOW (ref >90)
Glucose, Bld: 90 mg/dL (ref 70–99)
Glucose, Bld: 94 mg/dL (ref 70–99)
Potassium: 5.7 mmol/L — ABNORMAL HIGH (ref 3.5–5.1)
Potassium: 4.1 mmol/L (ref 3.5–5.1)
Sodium: 138 mmol/L (ref 135–145)
Sodium: 140 mmol/L (ref 135–145)

## 2014-09-10 LAB — CBC
HCT: 46.4 % (ref 39.0–52.0)
Hemoglobin: 16.4 g/dL (ref 13.0–17.0)
MCH: 30.5 pg (ref 26.0–34.0)
MCHC: 35.3 g/dL (ref 30.0–36.0)
MCV: 86.4 fL (ref 78.0–100.0)
Platelets: 183 10*3/uL (ref 150–400)
RBC: 5.37 MIL/uL (ref 4.22–5.81)
RDW: 12.6 % (ref 11.5–15.5)
WBC: 7.5 10*3/uL (ref 4.0–10.5)

## 2014-09-10 LAB — HEPATIC FUNCTION PANEL
ALT: 36 U/L (ref 0–53)
AST: 58 U/L — ABNORMAL HIGH (ref 0–37)
Albumin: 4.3 g/dL (ref 3.5–5.2)
Alkaline Phosphatase: 78 U/L (ref 39–117)
Bilirubin, Direct: 0.7 mg/dL — ABNORMAL HIGH (ref 0.0–0.5)
Indirect Bilirubin: 1.1 mg/dL — ABNORMAL HIGH (ref 0.3–0.9)
Total Bilirubin: 1.8 mg/dL — ABNORMAL HIGH (ref 0.3–1.2)
Total Protein: 7 g/dL (ref 6.0–8.3)

## 2014-09-10 LAB — I-STAT TROPONIN, ED: Troponin i, poc: 0.07 ng/mL (ref 0.00–0.08)

## 2014-09-10 LAB — BRAIN NATRIURETIC PEPTIDE: B Natriuretic Peptide: 31.6 pg/mL (ref 0.0–100.0)

## 2014-09-10 LAB — LIPASE, BLOOD: Lipase: 29 U/L (ref 11–59)

## 2014-09-10 MED ORDER — OMEPRAZOLE 20 MG PO CPDR: 20.0000 mg | DELAYED_RELEASE_CAPSULE | Freq: Every day | ORAL | Status: DC

## 2014-09-10 NOTE — ED Notes (Signed)
Pt explaining that "it feels like i have a bump in my chest when I eat something and the food has to go over it, that's what makes me have pain." States this doesn't feel like it is his heart. Pt not having any pain at this time. Says the pain is not there unless he eats.

## 2014-09-10 NOTE — Discharge Instructions (Signed)

## 2014-09-10 NOTE — ED Notes (Signed)
Pt having chest discomfort since yesterday evening. Pt thought maybe indigestion. Pt having pain in epigastric area.

## 2014-09-10 NOTE — ED Notes (Addendum)
Kim, Phlebotomy notified that pt is back from xray to come get rest of labs.

## 2014-09-10 NOTE — ED Provider Notes (Signed)
CSN: [id number]     Arrival date & time 09/10/14  1008 History   First MD Initiated Contact with Patient 09/10/14 1019     Chief Complaint  Patient presents with  . Chest Pain     (Consider location/radiation/quality/duration/timing/severity/associated sxs/prior Treatment) Patient is a 81 y.o. male presenting with chest pain. The history is provided by the patient.  Chest Pain Pain location:  Substernal area Associated symptoms: no abdominal pain, no back pain, no headache, no nausea, no numbness, no shortness of breath, not vomiting and no weakness    patient has pain in his chest. States it comes on only after eating. States he feels as if food may get stuck but has not had it feels like it's blocked. No nausea vomiting. Psychologic exertion. No lightheadedness or dizziness. No blood in the stool. Previous history of coronary artery disease with states this does not feel like that. He has never had an upper endoscopy but has had a colonoscopy in the past by Dr. Collene Mares. Pain is dull. It'll resolve on its own.  Past Medical History  Diagnosis Date  . Coronary artery disease   . Hypertension   . Hyperlipemia   . Anginal pain 02/08/12  . Exertional dyspnea     "doesn't take much these days"  . Type II diabetes mellitus   . Hypothyroidism   . Stroke 09/2010    denies residual  . H/O hiatal hernia   . Arthritis     "all over"   Past Surgical History  Procedure Laterality Date  . Eye surgery    . Coronary artery bypass graft  1999    CABG X4  . Coronary angioplasty with stent placement  ~ 2004    "1"  . Cataract extraction w/ intraocular lens  implant, bilateral  ~ 2007  . Retinal detachment surgery  10/2011    right eye  . Colonoscopy w/ polypectomy  09/2011    "removed 7"   Family History  Problem Relation Age of Onset  . Heart attack Mother    History  Substance Use Topics  . Smoking status: Former Smoker -- 2.00 packs/day for 35 years    Types: Cigarettes    Quit date:  07/25/1993  . Smokeless tobacco: Former Systems developer    Quit date: 07/18/1995  . Alcohol Use: No     Comment: 02/08/12 "stopped ~ 1995"    Review of Systems  Constitutional: Negative for activity change and appetite change.  Eyes: Negative for pain.  Respiratory: Negative for chest tightness and shortness of breath.   Cardiovascular: Positive for chest pain. Negative for leg swelling.  Gastrointestinal: Negative for nausea, vomiting, abdominal pain and diarrhea.  Genitourinary: Negative for flank pain.  Musculoskeletal: Negative for back pain and neck stiffness.  Skin: Negative for rash.  Neurological: Negative for weakness, numbness and headaches.  Psychiatric/Behavioral: Negative for behavioral problems.      Allergies  Review of patient's allergies indicates no known allergies.  Home Medications   Prior to Admission medications   Medication Sig Start Date End Date Taking? Authorizing Provider  amLODipine (NORVASC) 10 MG tablet Take 1 tablet by mouth daily. 06/12/14  Yes Jettie Booze, MD  Aromatic Inhalants (VICKS VAPOINHALER IN) Inhale 1 Inhaler into the lungs daily as needed (For congestion).   Yes Historical Provider, MD  atenolol (TENORMIN) 25 MG tablet Take 25 mg by mouth daily.   Yes Historical Provider, MD  atorvastatin (LIPITOR) 20 MG tablet Take 20 mg by mouth daily.  Yes Historical Provider, MD  furosemide (LASIX) 80 MG tablet Take 160 mg by mouth daily. 1 to 2 tabs daily   Yes Historical Provider, MD  insulin aspart (NOVOLOG) 100 UNIT/ML injection Inject 12 Units into the skin 3 (three) times daily with meals.    Yes Historical Provider, MD  insulin detemir (LEVEMIR) 100 UNIT/ML injection Inject 20 Units into the skin 2 (two) times daily.    Yes Historical Provider, MD  levothyroxine (SYNTHROID, LEVOTHROID) 75 MCG tablet Take 75 mcg by mouth daily.   Yes Historical Provider, MD  nitroGLYCERIN (NITROSTAT) 0.4 MG SL tablet prn 07/23/13  Yes Jettie Booze, MD   omeprazole (PRILOSEC) 20 MG capsule Take 1 capsule (20 mg total) by mouth daily. 09/10/14   Jasper Riling. Melquisedec Journey, MD  polyvinyl alcohol (LIQUIFILM TEARS) 1.4 % ophthalmic solution Place 1 drop into both eyes as needed (For dry eyes).   Yes Historical Provider, MD   BP 123/63 mmHg  Pulse 56  Temp(Src) 97.6 F (36.4 C)  Resp 27  SpO2 93% Physical Exam  Constitutional: He is oriented to person, place, and time. He appears well-developed and well-nourished.  HENT:  Head: Normocephalic and atraumatic.  Mouth/Throat: No oropharyngeal exudate.  Neck: Normal range of motion. Neck supple.  Cardiovascular: Normal rate, regular rhythm and normal heart sounds.   No murmur heard. Pulmonary/Chest: Effort normal and breath sounds normal.  Abdominal: Soft. Bowel sounds are normal. He exhibits no distension and no mass. There is no tenderness. There is no rebound and no guarding.  Musculoskeletal: Normal range of motion. He exhibits no edema.  Neurological: He is alert and oriented to person, place, and time.  Skin: Skin is warm and dry.  Nursing note and vitals reviewed.   ED Course  Procedures (including critical care time) Labs Review Labs Reviewed  BASIC METABOLIC PANEL - Abnormal; Notable for the following:    Potassium 5.7 (*)    BUN 31 (*)    Creatinine, Ser 1.57 (*)    GFR calc non Af Amer 40 (*)    GFR calc Af Amer 47 (*)    All other components within normal limits  HEPATIC FUNCTION PANEL - Abnormal; Notable for the following:    AST 58 (*)    Total Bilirubin 1.8 (*)    Bilirubin, Direct 0.7 (*)    Indirect Bilirubin 1.1 (*)    All other components within normal limits  BASIC METABOLIC PANEL - Abnormal; Notable for the following:    BUN 31 (*)    Creatinine, Ser 1.60 (*)    GFR calc non Af Amer 39 (*)    GFR calc Af Amer 46 (*)    All other components within normal limits  BRAIN NATRIURETIC PEPTIDE  LIPASE, BLOOD  I-STAT TROPOININ, ED    Imaging Review Dg Chest 2  View  09/10/2014   CLINICAL DATA:  Chest pain while swallowing for 4 days  EXAM: CHEST  2 VIEW  COMPARISON:  None.  FINDINGS: The cardiac shadow is within normal limits. Postsurgical changes are noted. The lungs are clear bilaterally. No acute bony abnormality is seen.  IMPRESSION: No active cardiopulmonary disease.   Electronically Signed   By: Inez Catalina M.D.   On: 09/10/2014 11:04     EKG Interpretation   Date/Time:  Wednesday September 10 2014 10:11:41 EST Ventricular Rate:  62 PR Interval:  266 QRS Duration: 94 QT Interval:  422 QTC Calculation: 428 R Axis:   30 Text Interpretation:  Sinus rhythm with 1st degree A-V block Cannot rule  out Anterior infarct , age undetermined Abnormal ECG No significant change  since last tracing Confirmed by Alvino Chapel  MD, Ovid Curd [phone]) on 09/10/2014  10:28:20 AM      MDM   Final diagnoses:  Chest pain    Patient with chest pain. May be of GI origin since it feels as if food may get stuck. EKG and lab works reassuring. X-rays negative. Has been going for the last few days and enzymes are negative. Will discharge home. We'll start a PPI and a follow-up with Dr. Collene Mares.    Jasper Riling. Alvino Chapel, MD 09/10/14 1355

## 2014-09-16 DIAGNOSIS — R079 Chest pain, unspecified: Secondary | ICD-10-CM | POA: Diagnosis not present

## 2014-09-16 DIAGNOSIS — E669 Obesity, unspecified: Secondary | ICD-10-CM | POA: Diagnosis not present

## 2014-09-16 DIAGNOSIS — K449 Diaphragmatic hernia without obstruction or gangrene: Secondary | ICD-10-CM | POA: Diagnosis not present

## 2014-09-16 DIAGNOSIS — K219 Gastro-esophageal reflux disease without esophagitis: Secondary | ICD-10-CM | POA: Diagnosis not present

## 2014-09-23 DIAGNOSIS — E039 Hypothyroidism, unspecified: Secondary | ICD-10-CM | POA: Diagnosis not present

## 2014-09-23 DIAGNOSIS — E785 Hyperlipidemia, unspecified: Secondary | ICD-10-CM | POA: Diagnosis not present

## 2014-10-07 DIAGNOSIS — E038 Other specified hypothyroidism: Secondary | ICD-10-CM | POA: Diagnosis not present

## 2014-10-07 DIAGNOSIS — E782 Mixed hyperlipidemia: Secondary | ICD-10-CM | POA: Diagnosis not present

## 2014-10-07 DIAGNOSIS — E1165 Type 2 diabetes mellitus with hyperglycemia: Secondary | ICD-10-CM | POA: Diagnosis not present

## 2014-10-10 DIAGNOSIS — N183 Chronic kidney disease, stage 3 (moderate): Secondary | ICD-10-CM | POA: Diagnosis not present

## 2014-10-10 DIAGNOSIS — E1165 Type 2 diabetes mellitus with hyperglycemia: Secondary | ICD-10-CM | POA: Diagnosis not present

## 2014-10-10 DIAGNOSIS — Z794 Long term (current) use of insulin: Secondary | ICD-10-CM | POA: Diagnosis not present

## 2014-10-10 DIAGNOSIS — E038 Other specified hypothyroidism: Secondary | ICD-10-CM | POA: Diagnosis not present

## 2014-10-10 DIAGNOSIS — I10 Essential (primary) hypertension: Secondary | ICD-10-CM | POA: Diagnosis not present

## 2014-10-10 DIAGNOSIS — E1122 Type 2 diabetes mellitus with diabetic chronic kidney disease: Secondary | ICD-10-CM | POA: Diagnosis not present

## 2014-10-10 DIAGNOSIS — E782 Mixed hyperlipidemia: Secondary | ICD-10-CM | POA: Diagnosis not present

## 2014-10-13 DIAGNOSIS — R809 Proteinuria, unspecified: Secondary | ICD-10-CM | POA: Diagnosis not present

## 2014-10-13 DIAGNOSIS — I129 Hypertensive chronic kidney disease with stage 1 through stage 4 chronic kidney disease, or unspecified chronic kidney disease: Secondary | ICD-10-CM | POA: Diagnosis not present

## 2014-10-13 DIAGNOSIS — N184 Chronic kidney disease, stage 4 (severe): Secondary | ICD-10-CM | POA: Diagnosis not present

## 2014-10-13 DIAGNOSIS — N183 Chronic kidney disease, stage 3 (moderate): Secondary | ICD-10-CM | POA: Diagnosis not present

## 2014-10-13 DIAGNOSIS — D631 Anemia in chronic kidney disease: Secondary | ICD-10-CM | POA: Diagnosis not present

## 2014-10-14 DIAGNOSIS — E11349 Type 2 diabetes mellitus with severe nonproliferative diabetic retinopathy without macular edema: Secondary | ICD-10-CM | POA: Diagnosis not present

## 2014-10-14 DIAGNOSIS — H35371 Puckering of macula, right eye: Secondary | ICD-10-CM | POA: Diagnosis not present

## 2015-01-08 DIAGNOSIS — E038 Other specified hypothyroidism: Secondary | ICD-10-CM | POA: Diagnosis not present

## 2015-01-08 DIAGNOSIS — E782 Mixed hyperlipidemia: Secondary | ICD-10-CM | POA: Diagnosis not present

## 2015-01-08 DIAGNOSIS — E1165 Type 2 diabetes mellitus with hyperglycemia: Secondary | ICD-10-CM | POA: Diagnosis not present

## 2015-01-13 DIAGNOSIS — E1165 Type 2 diabetes mellitus with hyperglycemia: Secondary | ICD-10-CM | POA: Diagnosis not present

## 2015-01-13 DIAGNOSIS — E038 Other specified hypothyroidism: Secondary | ICD-10-CM | POA: Diagnosis not present

## 2015-01-13 DIAGNOSIS — N183 Chronic kidney disease, stage 3 (moderate): Secondary | ICD-10-CM | POA: Diagnosis not present

## 2015-01-13 DIAGNOSIS — E1122 Type 2 diabetes mellitus with diabetic chronic kidney disease: Secondary | ICD-10-CM | POA: Diagnosis not present

## 2015-01-13 DIAGNOSIS — I10 Essential (primary) hypertension: Secondary | ICD-10-CM | POA: Diagnosis not present

## 2015-01-13 DIAGNOSIS — Z794 Long term (current) use of insulin: Secondary | ICD-10-CM | POA: Diagnosis not present

## 2015-01-13 DIAGNOSIS — E782 Mixed hyperlipidemia: Secondary | ICD-10-CM | POA: Diagnosis not present

## 2015-02-10 DIAGNOSIS — D631 Anemia in chronic kidney disease: Secondary | ICD-10-CM | POA: Diagnosis not present

## 2015-02-10 DIAGNOSIS — E1129 Type 2 diabetes mellitus with other diabetic kidney complication: Secondary | ICD-10-CM | POA: Diagnosis not present

## 2015-02-10 DIAGNOSIS — N2581 Secondary hyperparathyroidism of renal origin: Secondary | ICD-10-CM | POA: Diagnosis not present

## 2015-02-10 DIAGNOSIS — N184 Chronic kidney disease, stage 4 (severe): Secondary | ICD-10-CM | POA: Diagnosis not present

## 2015-02-10 DIAGNOSIS — I129 Hypertensive chronic kidney disease with stage 1 through stage 4 chronic kidney disease, or unspecified chronic kidney disease: Secondary | ICD-10-CM | POA: Diagnosis not present

## 2015-02-10 DIAGNOSIS — N183 Chronic kidney disease, stage 3 (moderate): Secondary | ICD-10-CM | POA: Diagnosis not present

## 2015-04-14 DIAGNOSIS — E038 Other specified hypothyroidism: Secondary | ICD-10-CM | POA: Diagnosis not present

## 2015-04-14 DIAGNOSIS — E782 Mixed hyperlipidemia: Secondary | ICD-10-CM | POA: Diagnosis not present

## 2015-04-14 DIAGNOSIS — E1165 Type 2 diabetes mellitus with hyperglycemia: Secondary | ICD-10-CM | POA: Diagnosis not present

## 2015-04-16 DIAGNOSIS — Z794 Long term (current) use of insulin: Secondary | ICD-10-CM | POA: Diagnosis not present

## 2015-04-16 DIAGNOSIS — E038 Other specified hypothyroidism: Secondary | ICD-10-CM | POA: Diagnosis not present

## 2015-04-16 DIAGNOSIS — E782 Mixed hyperlipidemia: Secondary | ICD-10-CM | POA: Diagnosis not present

## 2015-04-16 DIAGNOSIS — I10 Essential (primary) hypertension: Secondary | ICD-10-CM | POA: Diagnosis not present

## 2015-04-16 DIAGNOSIS — N183 Chronic kidney disease, stage 3 (moderate): Secondary | ICD-10-CM | POA: Diagnosis not present

## 2015-04-16 DIAGNOSIS — E1165 Type 2 diabetes mellitus with hyperglycemia: Secondary | ICD-10-CM | POA: Diagnosis not present

## 2015-04-16 DIAGNOSIS — E1122 Type 2 diabetes mellitus with diabetic chronic kidney disease: Secondary | ICD-10-CM | POA: Diagnosis not present

## 2015-04-29 NOTE — Patient Outreach (Signed)
[place] [facility]) Care Management  04/29/2015  Kayshawn Schmuhl [birth year] [id number]   Referral from [employer] Tier 2 List, assigned Jon Billings, RN to outreach for [place] Management services.  Thanks, Ronnell Freshwater. [place], [place] Assistant Phone: [phone] Fax: [phone]

## 2015-05-01 ENCOUNTER — Other Ambulatory Visit: Payer: Self-pay

## 2015-05-01 NOTE — Patient Outreach (Signed)
[place] [facility]) Care Management  05/01/2015  Luke [name] 09/08/1932 [id number]  Referral: Tier 2  Referral Date: 04-29-15  COPD: Patient reports he has some problems with his breathing at times but does not use any inhalers and takes some cough syrup every once and a while.    Diabetes: Patient reports he does check his sugars and that it ranges about 140-180.  Patient unsure of what last A1c was.   Explained to patient [place] [facility] Services to patient.  Patient reports that he has many doctors that he sees and he feels he is in ok health and feels he does not need services at this time.  Patient agreeable to receive letter and brochure in the mail.    Plan: RN Health Coach will send letter and brochure to patient. RN Health Coach will send in basket to [name] for case closure.    [name], RN, MSN [place] [phone]

## 2015-05-06 NOTE — Patient Outreach (Signed)
[place] [facility]) Care Management  05/06/2015  Luke [name] Oct 10, 1932 [id number]   Request from Jon Billings, RN to close case due to patient refused [place] Management services.  Thanks, Ronnell Freshwater. [place], [place] Assistant Phone: [phone] Fax: [phone]

## 2015-05-26 ENCOUNTER — Other Ambulatory Visit: Payer: Self-pay | Admitting: *Deleted

## 2015-05-26 ENCOUNTER — Encounter: Payer: Self-pay | Admitting: *Deleted

## 2015-05-26 NOTE — Patient Outreach (Signed)
Talked with [name] about [facility] care mangement services. He tells me he sees his MDs regularly and doesn't see how our [facility] services can benefit him at this time. He agreed to let me send him a letter and he understands he can call me later if he changes his mind.  Deloria Lair [facility] [phone]

## 2015-06-02 DIAGNOSIS — N183 Chronic kidney disease, stage 3 (moderate): Secondary | ICD-10-CM | POA: Diagnosis not present

## 2015-06-02 DIAGNOSIS — E039 Hypothyroidism, unspecified: Secondary | ICD-10-CM | POA: Diagnosis not present

## 2015-06-02 DIAGNOSIS — E8779 Other fluid overload: Secondary | ICD-10-CM | POA: Diagnosis not present

## 2015-06-02 DIAGNOSIS — N184 Chronic kidney disease, stage 4 (severe): Secondary | ICD-10-CM | POA: Diagnosis not present

## 2015-06-02 DIAGNOSIS — E782 Mixed hyperlipidemia: Secondary | ICD-10-CM | POA: Diagnosis not present

## 2015-06-02 DIAGNOSIS — E1129 Type 2 diabetes mellitus with other diabetic kidney complication: Secondary | ICD-10-CM | POA: Diagnosis not present

## 2015-06-02 DIAGNOSIS — D631 Anemia in chronic kidney disease: Secondary | ICD-10-CM | POA: Diagnosis not present

## 2015-06-02 DIAGNOSIS — R809 Proteinuria, unspecified: Secondary | ICD-10-CM | POA: Diagnosis not present

## 2015-06-02 DIAGNOSIS — I6350 Cerebral infarction due to unspecified occlusion or stenosis of unspecified cerebral artery: Secondary | ICD-10-CM | POA: Diagnosis not present

## 2015-06-02 DIAGNOSIS — I129 Hypertensive chronic kidney disease with stage 1 through stage 4 chronic kidney disease, or unspecified chronic kidney disease: Secondary | ICD-10-CM | POA: Diagnosis not present

## 2015-06-02 DIAGNOSIS — I25119 Atherosclerotic heart disease of native coronary artery with unspecified angina pectoris: Secondary | ICD-10-CM | POA: Diagnosis not present

## 2015-06-02 DIAGNOSIS — N2581 Secondary hyperparathyroidism of renal origin: Secondary | ICD-10-CM | POA: Diagnosis not present

## 2015-06-09 ENCOUNTER — Other Ambulatory Visit: Payer: Self-pay | Admitting: Interventional Cardiology

## 2015-06-15 DIAGNOSIS — H43822 Vitreomacular adhesion, left eye: Secondary | ICD-10-CM | POA: Diagnosis not present

## 2015-06-15 DIAGNOSIS — H35371 Puckering of macula, right eye: Secondary | ICD-10-CM | POA: Diagnosis not present

## 2015-06-15 DIAGNOSIS — E113493 Type 2 diabetes mellitus with severe nonproliferative diabetic retinopathy without macular edema, bilateral: Secondary | ICD-10-CM | POA: Diagnosis not present

## 2015-06-15 DIAGNOSIS — H3563 Retinal hemorrhage, bilateral: Secondary | ICD-10-CM | POA: Diagnosis not present

## 2015-06-16 DIAGNOSIS — Z794 Long term (current) use of insulin: Secondary | ICD-10-CM | POA: Diagnosis not present

## 2015-06-16 DIAGNOSIS — N183 Chronic kidney disease, stage 3 (moderate): Secondary | ICD-10-CM | POA: Diagnosis not present

## 2015-06-16 DIAGNOSIS — I10 Essential (primary) hypertension: Secondary | ICD-10-CM | POA: Diagnosis not present

## 2015-06-16 DIAGNOSIS — E039 Hypothyroidism, unspecified: Secondary | ICD-10-CM | POA: Diagnosis not present

## 2015-06-16 DIAGNOSIS — E782 Mixed hyperlipidemia: Secondary | ICD-10-CM | POA: Diagnosis not present

## 2015-06-16 DIAGNOSIS — Z9119 Patient's noncompliance with other medical treatment and regimen: Secondary | ICD-10-CM | POA: Diagnosis not present

## 2015-06-16 DIAGNOSIS — E1121 Type 2 diabetes mellitus with diabetic nephropathy: Secondary | ICD-10-CM | POA: Diagnosis not present

## 2015-07-15 DIAGNOSIS — E1165 Type 2 diabetes mellitus with hyperglycemia: Secondary | ICD-10-CM | POA: Diagnosis not present

## 2015-07-15 DIAGNOSIS — E038 Other specified hypothyroidism: Secondary | ICD-10-CM | POA: Diagnosis not present

## 2015-07-15 DIAGNOSIS — E782 Mixed hyperlipidemia: Secondary | ICD-10-CM | POA: Diagnosis not present

## 2015-07-16 ENCOUNTER — Other Ambulatory Visit: Payer: Self-pay | Admitting: Interventional Cardiology

## 2015-07-16 NOTE — Telephone Encounter (Signed)
amLODipine (NORVASC) 10 MG tablet  Medication   Date: 06/10/2015  Department: [address] Office  Ordering/Authorizing: Jettie Booze, MD      Order Providers    Prescribing Provider Encounter Provider   Jettie Booze, MD Jettie Booze, MD    Medication Detail      Disp Refills Start End     amLODipine (NORVASC) 10 MG tablet 30 tablet 0 06/10/2015     Sig: TAKE 1 TABLET BY MOUTH ONCE DAILY    Notes to Pharmacy: Patient is overdue for an appointment. Please call and schedule    E-Prescribing Status: Receipt confirmed by pharmacy (06/10/2015 2:51 PM EST)     Pharmacy    [id number] - [place], [place]   1st attempt, no appt made

## 2015-07-17 DIAGNOSIS — E038 Other specified hypothyroidism: Secondary | ICD-10-CM | POA: Diagnosis not present

## 2015-07-17 DIAGNOSIS — E1122 Type 2 diabetes mellitus with diabetic chronic kidney disease: Secondary | ICD-10-CM | POA: Diagnosis not present

## 2015-07-17 DIAGNOSIS — I10 Essential (primary) hypertension: Secondary | ICD-10-CM | POA: Diagnosis not present

## 2015-07-17 DIAGNOSIS — E782 Mixed hyperlipidemia: Secondary | ICD-10-CM | POA: Diagnosis not present

## 2015-07-17 DIAGNOSIS — E1165 Type 2 diabetes mellitus with hyperglycemia: Secondary | ICD-10-CM | POA: Diagnosis not present

## 2015-07-17 DIAGNOSIS — N183 Chronic kidney disease, stage 3 (moderate): Secondary | ICD-10-CM | POA: Diagnosis not present

## 2015-07-17 DIAGNOSIS — Z794 Long term (current) use of insulin: Secondary | ICD-10-CM | POA: Diagnosis not present

## 2015-08-09 ENCOUNTER — Other Ambulatory Visit: Payer: Self-pay | Admitting: Interventional Cardiology

## 2015-09-01 DIAGNOSIS — I10 Essential (primary) hypertension: Secondary | ICD-10-CM | POA: Diagnosis not present

## 2015-09-01 DIAGNOSIS — E039 Hypothyroidism, unspecified: Secondary | ICD-10-CM | POA: Diagnosis not present

## 2015-09-01 DIAGNOSIS — E785 Hyperlipidemia, unspecified: Secondary | ICD-10-CM | POA: Diagnosis not present

## 2015-09-01 DIAGNOSIS — E1121 Type 2 diabetes mellitus with diabetic nephropathy: Secondary | ICD-10-CM | POA: Diagnosis not present

## 2015-09-01 DIAGNOSIS — N183 Chronic kidney disease, stage 3 (moderate): Secondary | ICD-10-CM | POA: Diagnosis not present

## 2015-09-03 DIAGNOSIS — Z794 Long term (current) use of insulin: Secondary | ICD-10-CM | POA: Diagnosis not present

## 2015-09-03 DIAGNOSIS — E782 Mixed hyperlipidemia: Secondary | ICD-10-CM | POA: Diagnosis not present

## 2015-09-03 DIAGNOSIS — N183 Chronic kidney disease, stage 3 (moderate): Secondary | ICD-10-CM | POA: Diagnosis not present

## 2015-09-03 DIAGNOSIS — E1122 Type 2 diabetes mellitus with diabetic chronic kidney disease: Secondary | ICD-10-CM | POA: Diagnosis not present

## 2015-09-03 DIAGNOSIS — E1165 Type 2 diabetes mellitus with hyperglycemia: Secondary | ICD-10-CM | POA: Diagnosis not present

## 2015-09-06 ENCOUNTER — Other Ambulatory Visit: Payer: Self-pay | Admitting: Interventional Cardiology

## 2015-09-07 NOTE — Telephone Encounter (Signed)
Pt has been advised to make an appt and he has yet to do that, he was given 3 attempts, ok to refill, deny or refer to PCP?

## 2015-09-07 NOTE — Telephone Encounter (Signed)
**Note De-Identified  Obfuscation** I spoke with the pt and he scheduled an appointment with Dr Irish Lack on 4/26 at 8:30. Ok to give enough refills until that appointment.

## 2015-10-15 DIAGNOSIS — E782 Mixed hyperlipidemia: Secondary | ICD-10-CM | POA: Diagnosis not present

## 2015-10-15 DIAGNOSIS — E1165 Type 2 diabetes mellitus with hyperglycemia: Secondary | ICD-10-CM | POA: Diagnosis not present

## 2015-10-15 DIAGNOSIS — E038 Other specified hypothyroidism: Secondary | ICD-10-CM | POA: Diagnosis not present

## 2015-10-20 DIAGNOSIS — E1122 Type 2 diabetes mellitus with diabetic chronic kidney disease: Secondary | ICD-10-CM | POA: Diagnosis not present

## 2015-10-20 DIAGNOSIS — E782 Mixed hyperlipidemia: Secondary | ICD-10-CM | POA: Diagnosis not present

## 2015-10-20 DIAGNOSIS — E038 Other specified hypothyroidism: Secondary | ICD-10-CM | POA: Diagnosis not present

## 2015-10-20 DIAGNOSIS — I10 Essential (primary) hypertension: Secondary | ICD-10-CM | POA: Diagnosis not present

## 2015-10-20 DIAGNOSIS — E1165 Type 2 diabetes mellitus with hyperglycemia: Secondary | ICD-10-CM | POA: Diagnosis not present

## 2015-10-20 DIAGNOSIS — Z794 Long term (current) use of insulin: Secondary | ICD-10-CM | POA: Diagnosis not present

## 2015-10-20 DIAGNOSIS — N183 Chronic kidney disease, stage 3 (moderate): Secondary | ICD-10-CM | POA: Diagnosis not present

## 2015-10-21 ENCOUNTER — Encounter: Payer: Self-pay | Admitting: Interventional Cardiology

## 2015-10-21 ENCOUNTER — Ambulatory Visit (INDEPENDENT_AMBULATORY_CARE_PROVIDER_SITE_OTHER): Payer: Medicare Other | Admitting: Interventional Cardiology

## 2015-10-21 VITALS — BP 142/60 | HR 67 | Ht 71.0 in | Wt 245.0 lb

## 2015-10-21 DIAGNOSIS — E782 Mixed hyperlipidemia: Secondary | ICD-10-CM

## 2015-10-21 DIAGNOSIS — I5032 Chronic diastolic (congestive) heart failure: Secondary | ICD-10-CM

## 2015-10-21 DIAGNOSIS — I2510 Atherosclerotic heart disease of native coronary artery without angina pectoris: Secondary | ICD-10-CM

## 2015-10-21 DIAGNOSIS — R0602 Shortness of breath: Secondary | ICD-10-CM

## 2015-10-21 DIAGNOSIS — E1159 Type 2 diabetes mellitus with other circulatory complications: Secondary | ICD-10-CM

## 2015-10-21 MED ORDER — ASPIRIN EC 81 MG PO TBEC: 81.0000 mg | DELAYED_RELEASE_TABLET | Freq: Every day | ORAL | Status: DC

## 2015-10-21 NOTE — Patient Instructions (Signed)
Medication Instructions:  1. RESTART ASPIRIN 81 MG DAILY  Labwork: NONE  Testing/Procedures: NONE  Follow-Up: Your physician wants you to follow-up in: [place] DR. VARANASI You will receive a reminder letter in the mail two months in advance. If you don't receive a letter, please call our office to schedule the follow-up appointment.   Any Other Special Instructions Will Be Listed Below (If Applicable).     If you need a refill on your cardiac medications before your next appointment, please call your pharmacy.

## 2015-10-21 NOTE — Progress Notes (Signed)
Patient ID: Luke [name], male   DOB: 02/12/1933, 82 y.o.   MRN: [id number]     Cardiology Office Note   Date:  10/21/2015   ID:  Luke [name], DOB 1932-12-27, MRN [id number]  PCP:  Aretta Nip, MD    Chief Complaint  Patient presents with  . Shortness of Breath     Wt Readings from Last 3 Encounters:  10/21/15 245 lb (111.131 kg)  06/09/14 247 lb (112.038 kg)  12/06/13 251 lb (113.853 kg)       History of Present Illness: Luke [name] is a 82 y.o. male  who has had CAD in the past, with CABG in 9/99 at [facility], [place]; LAD stent in 3/09, through the graft to the distal LAD.  He has not had any anginal sx.  Breathing is stable.  He takes his furosemide to diurese.  He has a persistent cough productive of mucus.   Prior revasc: Atherosclerotic heart disease with myocardial infarction 1999; S/P CABG x3 in 1999 (RIMA to LAD, LIMA to OM, SVG to PDA); S/P drug-eluting coronary stent to LAD in 3/09 Luke Caprice, MD).  Luke [name] is still most strenuous activity. No angina or NTG use when doing these activities.      Past Medical History  Diagnosis Date  . Coronary artery disease   . Hypertension   . Hyperlipemia   . Anginal pain ([place]) 02/08/12  . Exertional dyspnea     "doesn't take much these days"  . Type II diabetes mellitus (Luke [name])   . Hypothyroidism   . Stroke [facility]) 09/2010    denies residual  . H/O hiatal hernia   . Arthritis     "all over"    Past Surgical History  Procedure Laterality Date  . Eye surgery    . Coronary artery bypass graft  1999    CABG X4  . Coronary angioplasty with stent placement  ~ 2004    "1"  . Cataract extraction w/ intraocular lens  implant, bilateral  ~ 2007  . Retinal detachment surgery  10/2011    right eye  . Colonoscopy w/ polypectomy  09/2011    "removed 7"     Current Outpatient Prescriptions  Medication Sig Dispense Refill  . amLODipine (NORVASC) 10 MG tablet TAKE 1 TABLET BY MOUTH ONCE DAILY  15 tablet 0  . atenolol (TENORMIN) 25 MG tablet Take 25 mg by mouth daily.    Marland Kitchen atorvastatin (LIPITOR) 20 MG tablet Take 20 mg by mouth daily.    . furosemide (LASIX) 80 MG tablet Take 160 mg by mouth daily. 1 to 2 tabs daily    . insulin aspart (NOVOLOG) 100 UNIT/ML injection Inject 18 Units into the skin 3 (three) times daily with meals.     . insulin detemir (LEVEMIR) 100 UNIT/ML injection Inject 26 Units into the skin 2 (two) times daily.     Marland Kitchen levothyroxine (SYNTHROID, LEVOTHROID) 75 MCG tablet Take 75 mcg by mouth daily.    . nitroGLYCERIN (NITROSTAT) 0.4 MG SL tablet prn    . omeprazole (PRILOSEC) 20 MG capsule Take 1 capsule (20 mg total) by mouth daily. 14 capsule 0  . polyvinyl alcohol (LIQUIFILM TEARS) 1.4 % ophthalmic solution Place 1 drop into both eyes as needed (For dry eyes).     No current facility-administered medications for this visit.    Allergies:   Review of patient's allergies indicates no known allergies.    Social History:  The patient  reports that he  quit smoking about 22 years ago. His smoking use included Cigarettes. He has a 70 pack-year smoking history. He quit smokeless tobacco use about 20 years ago. He reports that he does not drink alcohol or use illicit drugs.   Family History:  The patient's   family history includes Heart attack in his mother.    ROS:  Please see the history of present illness.   Otherwise, review of systems are positive for joint pains.   All other systems are reviewed and negative.    PHYSICAL EXAM: VS:  BP 142/60 mmHg  Pulse 67  Ht 5\' 11"  (1.803 m)  Wt 245 lb (111.131 kg)  BMI 34.19 kg/m2 , BMI Body mass index is 34.19 kg/(m^2). GEN: Well nourished, well developed, in no acute distress HEENT: normal Neck: no JVD, carotid bruits, or masses Cardiac: RRR; no murmurs, rubs, or gallops,no edema  Respiratory:  clear to auscultation bilaterally, normal work of breathing GI: soft, nontender, nondistended, + BS MS: no deformity or  atrophy Skin: warm and dry, no rash Neuro:  Strength and sensation are intact Psych: euthymic mood, full affect   EKG:   The ekg ordered today demonstrates NSR, prolonged PR, no ST segment changes   Recent Labs: No results found for requested labs within last 365 days.   Lipid Panel No results found for: CHOL, TRIG, HDL, CHOLHDL, VLDL, LDLCALC, LDLDIRECT   Other studies Reviewed: Additional studies/ records that were reviewed today with results demonstrating: prior cath records.   ASSESSMENT AND PLAN:  1. CAD: Restart ASpirin 81 mg daily.  He stopped taking it since the last visit.  2. Hyperlipidemia: COntinue atorvastatin.  Last checked by Dr. Elyse Hsu 2 days ago.  Will obtain lab results.  3. Chronic diastolic failure: COntinue Lasix.  No heart failure sx at this time.  4. Abnormal stress test a few years ago.  No angina.  Doing well on medical therapy.  No need for heart cath at this time.  5. DM: Follwed by endocrinologist.    Current medicines are reviewed at length with the patient today.  The patient concerns regarding his medicines were addressed.  The following changes have been made:  No change  Labs/ tests ordered today include:  Orders Placed This Encounter  Procedures  . EKG 12-Lead    Recommend 150 minutes/week of aerobic exercise Low fat, low carb, high fiber diet recommended  Disposition:   FU in 1 year   Teresita Madura., MD  10/21/2015 9:54 AM    [facility] [place], [place],   [address] Phone: [phone]; Fax: [phone]

## 2015-10-23 ENCOUNTER — Encounter: Payer: Self-pay | Admitting: Interventional Cardiology

## 2015-10-27 DIAGNOSIS — I25119 Atherosclerotic heart disease of native coronary artery with unspecified angina pectoris: Secondary | ICD-10-CM | POA: Diagnosis not present

## 2015-10-27 DIAGNOSIS — R809 Proteinuria, unspecified: Secondary | ICD-10-CM | POA: Diagnosis not present

## 2015-10-27 DIAGNOSIS — N2581 Secondary hyperparathyroidism of renal origin: Secondary | ICD-10-CM | POA: Diagnosis not present

## 2015-10-27 DIAGNOSIS — Z0000 Encounter for general adult medical examination without abnormal findings: Secondary | ICD-10-CM | POA: Diagnosis not present

## 2015-10-27 DIAGNOSIS — E039 Hypothyroidism, unspecified: Secondary | ICD-10-CM | POA: Diagnosis not present

## 2015-10-27 DIAGNOSIS — N183 Chronic kidney disease, stage 3 (moderate): Secondary | ICD-10-CM | POA: Diagnosis not present

## 2015-10-27 DIAGNOSIS — D631 Anemia in chronic kidney disease: Secondary | ICD-10-CM | POA: Diagnosis not present

## 2015-10-27 DIAGNOSIS — E1129 Type 2 diabetes mellitus with other diabetic kidney complication: Secondary | ICD-10-CM | POA: Diagnosis not present

## 2015-10-27 DIAGNOSIS — E8779 Other fluid overload: Secondary | ICD-10-CM | POA: Diagnosis not present

## 2015-10-27 DIAGNOSIS — I129 Hypertensive chronic kidney disease with stage 1 through stage 4 chronic kidney disease, or unspecified chronic kidney disease: Secondary | ICD-10-CM | POA: Diagnosis not present

## 2015-10-27 DIAGNOSIS — E782 Mixed hyperlipidemia: Secondary | ICD-10-CM | POA: Diagnosis not present

## 2015-10-27 DIAGNOSIS — N184 Chronic kidney disease, stage 4 (severe): Secondary | ICD-10-CM | POA: Diagnosis not present

## 2015-10-27 DIAGNOSIS — I6350 Cerebral infarction due to unspecified occlusion or stenosis of unspecified cerebral artery: Secondary | ICD-10-CM | POA: Diagnosis not present

## 2015-11-18 ENCOUNTER — Ambulatory Visit: Payer: No Typology Code available for payment source | Admitting: Interventional Cardiology

## 2016-01-11 DIAGNOSIS — E113553 Type 2 diabetes mellitus with stable proliferative diabetic retinopathy, bilateral: Secondary | ICD-10-CM | POA: Diagnosis not present

## 2016-01-11 DIAGNOSIS — H43822 Vitreomacular adhesion, left eye: Secondary | ICD-10-CM | POA: Diagnosis not present

## 2016-01-11 DIAGNOSIS — H35371 Puckering of macula, right eye: Secondary | ICD-10-CM | POA: Diagnosis not present

## 2016-01-11 DIAGNOSIS — H3563 Retinal hemorrhage, bilateral: Secondary | ICD-10-CM | POA: Diagnosis not present

## 2016-01-18 DIAGNOSIS — E038 Other specified hypothyroidism: Secondary | ICD-10-CM | POA: Diagnosis not present

## 2016-01-18 DIAGNOSIS — E1165 Type 2 diabetes mellitus with hyperglycemia: Secondary | ICD-10-CM | POA: Diagnosis not present

## 2016-01-18 DIAGNOSIS — E782 Mixed hyperlipidemia: Secondary | ICD-10-CM | POA: Diagnosis not present

## 2016-01-19 DIAGNOSIS — N183 Chronic kidney disease, stage 3 (moderate): Secondary | ICD-10-CM | POA: Diagnosis not present

## 2016-01-19 DIAGNOSIS — I10 Essential (primary) hypertension: Secondary | ICD-10-CM | POA: Diagnosis not present

## 2016-01-19 DIAGNOSIS — E1165 Type 2 diabetes mellitus with hyperglycemia: Secondary | ICD-10-CM | POA: Diagnosis not present

## 2016-01-19 DIAGNOSIS — E038 Other specified hypothyroidism: Secondary | ICD-10-CM | POA: Diagnosis not present

## 2016-01-19 DIAGNOSIS — Z794 Long term (current) use of insulin: Secondary | ICD-10-CM | POA: Diagnosis not present

## 2016-01-19 DIAGNOSIS — E1122 Type 2 diabetes mellitus with diabetic chronic kidney disease: Secondary | ICD-10-CM | POA: Diagnosis not present

## 2016-01-19 DIAGNOSIS — E782 Mixed hyperlipidemia: Secondary | ICD-10-CM | POA: Diagnosis not present

## 2016-03-18 DIAGNOSIS — E11649 Type 2 diabetes mellitus with hypoglycemia without coma: Secondary | ICD-10-CM | POA: Diagnosis not present

## 2016-03-18 DIAGNOSIS — Z794 Long term (current) use of insulin: Secondary | ICD-10-CM | POA: Diagnosis not present

## 2016-03-18 DIAGNOSIS — T887XXA Unspecified adverse effect of drug or medicament, initial encounter: Secondary | ICD-10-CM | POA: Diagnosis not present

## 2016-04-07 DIAGNOSIS — Z23 Encounter for immunization: Secondary | ICD-10-CM | POA: Diagnosis not present

## 2016-04-15 DIAGNOSIS — Z951 Presence of aortocoronary bypass graft: Secondary | ICD-10-CM | POA: Diagnosis not present

## 2016-04-15 DIAGNOSIS — G3184 Mild cognitive impairment, so stated: Secondary | ICD-10-CM | POA: Diagnosis not present

## 2016-04-15 DIAGNOSIS — I252 Old myocardial infarction: Secondary | ICD-10-CM | POA: Diagnosis not present

## 2016-04-15 DIAGNOSIS — E1122 Type 2 diabetes mellitus with diabetic chronic kidney disease: Secondary | ICD-10-CM | POA: Diagnosis not present

## 2016-04-15 DIAGNOSIS — E785 Hyperlipidemia, unspecified: Secondary | ICD-10-CM | POA: Diagnosis not present

## 2016-04-15 DIAGNOSIS — E039 Hypothyroidism, unspecified: Secondary | ICD-10-CM | POA: Diagnosis not present

## 2016-04-15 DIAGNOSIS — Z794 Long term (current) use of insulin: Secondary | ICD-10-CM | POA: Diagnosis not present

## 2016-04-15 DIAGNOSIS — E11649 Type 2 diabetes mellitus with hypoglycemia without coma: Secondary | ICD-10-CM | POA: Diagnosis not present

## 2016-04-15 DIAGNOSIS — I129 Hypertensive chronic kidney disease with stage 1 through stage 4 chronic kidney disease, or unspecified chronic kidney disease: Secondary | ICD-10-CM | POA: Diagnosis not present

## 2016-04-15 DIAGNOSIS — I2510 Atherosclerotic heart disease of native coronary artery without angina pectoris: Secondary | ICD-10-CM | POA: Diagnosis not present

## 2016-04-15 DIAGNOSIS — Z955 Presence of coronary angioplasty implant and graft: Secondary | ICD-10-CM | POA: Diagnosis not present

## 2016-04-15 DIAGNOSIS — N183 Chronic kidney disease, stage 3 (moderate): Secondary | ICD-10-CM | POA: Diagnosis not present

## 2016-04-18 DIAGNOSIS — E1165 Type 2 diabetes mellitus with hyperglycemia: Secondary | ICD-10-CM | POA: Diagnosis not present

## 2016-04-18 DIAGNOSIS — E782 Mixed hyperlipidemia: Secondary | ICD-10-CM | POA: Diagnosis not present

## 2016-04-18 DIAGNOSIS — E038 Other specified hypothyroidism: Secondary | ICD-10-CM | POA: Diagnosis not present

## 2016-04-19 DIAGNOSIS — R4189 Other symptoms and signs involving cognitive functions and awareness: Secondary | ICD-10-CM | POA: Diagnosis not present

## 2016-04-19 DIAGNOSIS — I129 Hypertensive chronic kidney disease with stage 1 through stage 4 chronic kidney disease, or unspecified chronic kidney disease: Secondary | ICD-10-CM | POA: Diagnosis not present

## 2016-04-19 DIAGNOSIS — Z7189 Other specified counseling: Secondary | ICD-10-CM | POA: Diagnosis not present

## 2016-04-19 DIAGNOSIS — I2510 Atherosclerotic heart disease of native coronary artery without angina pectoris: Secondary | ICD-10-CM | POA: Diagnosis not present

## 2016-04-19 DIAGNOSIS — E1122 Type 2 diabetes mellitus with diabetic chronic kidney disease: Secondary | ICD-10-CM | POA: Diagnosis not present

## 2016-04-19 DIAGNOSIS — E7800 Pure hypercholesterolemia, unspecified: Secondary | ICD-10-CM | POA: Diagnosis not present

## 2016-04-19 DIAGNOSIS — G3184 Mild cognitive impairment, so stated: Secondary | ICD-10-CM | POA: Diagnosis not present

## 2016-04-19 DIAGNOSIS — Z9114 Patient's other noncompliance with medication regimen: Secondary | ICD-10-CM | POA: Diagnosis not present

## 2016-04-19 DIAGNOSIS — E11649 Type 2 diabetes mellitus with hypoglycemia without coma: Secondary | ICD-10-CM | POA: Diagnosis not present

## 2016-04-19 DIAGNOSIS — N183 Chronic kidney disease, stage 3 (moderate): Secondary | ICD-10-CM | POA: Diagnosis not present

## 2016-04-21 DIAGNOSIS — E038 Other specified hypothyroidism: Secondary | ICD-10-CM | POA: Diagnosis not present

## 2016-04-21 DIAGNOSIS — I10 Essential (primary) hypertension: Secondary | ICD-10-CM | POA: Diagnosis not present

## 2016-04-21 DIAGNOSIS — E1122 Type 2 diabetes mellitus with diabetic chronic kidney disease: Secondary | ICD-10-CM | POA: Diagnosis not present

## 2016-04-21 DIAGNOSIS — E1165 Type 2 diabetes mellitus with hyperglycemia: Secondary | ICD-10-CM | POA: Diagnosis not present

## 2016-04-21 DIAGNOSIS — N183 Chronic kidney disease, stage 3 (moderate): Secondary | ICD-10-CM | POA: Diagnosis not present

## 2016-04-21 DIAGNOSIS — E782 Mixed hyperlipidemia: Secondary | ICD-10-CM | POA: Diagnosis not present

## 2016-04-21 DIAGNOSIS — Z794 Long term (current) use of insulin: Secondary | ICD-10-CM | POA: Diagnosis not present

## 2016-04-22 DIAGNOSIS — G3184 Mild cognitive impairment, so stated: Secondary | ICD-10-CM | POA: Diagnosis not present

## 2016-04-22 DIAGNOSIS — E1122 Type 2 diabetes mellitus with diabetic chronic kidney disease: Secondary | ICD-10-CM | POA: Diagnosis not present

## 2016-04-22 DIAGNOSIS — I129 Hypertensive chronic kidney disease with stage 1 through stage 4 chronic kidney disease, or unspecified chronic kidney disease: Secondary | ICD-10-CM | POA: Diagnosis not present

## 2016-04-22 DIAGNOSIS — E11649 Type 2 diabetes mellitus with hypoglycemia without coma: Secondary | ICD-10-CM | POA: Diagnosis not present

## 2016-04-22 DIAGNOSIS — N183 Chronic kidney disease, stage 3 (moderate): Secondary | ICD-10-CM | POA: Diagnosis not present

## 2016-04-22 DIAGNOSIS — I2510 Atherosclerotic heart disease of native coronary artery without angina pectoris: Secondary | ICD-10-CM | POA: Diagnosis not present

## 2016-04-25 DIAGNOSIS — E11649 Type 2 diabetes mellitus with hypoglycemia without coma: Secondary | ICD-10-CM | POA: Diagnosis not present

## 2016-04-25 DIAGNOSIS — I129 Hypertensive chronic kidney disease with stage 1 through stage 4 chronic kidney disease, or unspecified chronic kidney disease: Secondary | ICD-10-CM | POA: Diagnosis not present

## 2016-04-25 DIAGNOSIS — G3184 Mild cognitive impairment, so stated: Secondary | ICD-10-CM | POA: Diagnosis not present

## 2016-04-25 DIAGNOSIS — E1122 Type 2 diabetes mellitus with diabetic chronic kidney disease: Secondary | ICD-10-CM | POA: Diagnosis not present

## 2016-04-25 DIAGNOSIS — I2510 Atherosclerotic heart disease of native coronary artery without angina pectoris: Secondary | ICD-10-CM | POA: Diagnosis not present

## 2016-04-25 DIAGNOSIS — N183 Chronic kidney disease, stage 3 (moderate): Secondary | ICD-10-CM | POA: Diagnosis not present

## 2016-04-28 DIAGNOSIS — G3184 Mild cognitive impairment, so stated: Secondary | ICD-10-CM | POA: Diagnosis not present

## 2016-04-28 DIAGNOSIS — I2510 Atherosclerotic heart disease of native coronary artery without angina pectoris: Secondary | ICD-10-CM | POA: Diagnosis not present

## 2016-04-28 DIAGNOSIS — E11649 Type 2 diabetes mellitus with hypoglycemia without coma: Secondary | ICD-10-CM | POA: Diagnosis not present

## 2016-04-28 DIAGNOSIS — N183 Chronic kidney disease, stage 3 (moderate): Secondary | ICD-10-CM | POA: Diagnosis not present

## 2016-04-28 DIAGNOSIS — I129 Hypertensive chronic kidney disease with stage 1 through stage 4 chronic kidney disease, or unspecified chronic kidney disease: Secondary | ICD-10-CM | POA: Diagnosis not present

## 2016-04-28 DIAGNOSIS — E1122 Type 2 diabetes mellitus with diabetic chronic kidney disease: Secondary | ICD-10-CM | POA: Diagnosis not present

## 2016-05-04 DIAGNOSIS — I2510 Atherosclerotic heart disease of native coronary artery without angina pectoris: Secondary | ICD-10-CM | POA: Diagnosis not present

## 2016-05-04 DIAGNOSIS — G3184 Mild cognitive impairment, so stated: Secondary | ICD-10-CM | POA: Diagnosis not present

## 2016-05-04 DIAGNOSIS — N183 Chronic kidney disease, stage 3 (moderate): Secondary | ICD-10-CM | POA: Diagnosis not present

## 2016-05-04 DIAGNOSIS — E11649 Type 2 diabetes mellitus with hypoglycemia without coma: Secondary | ICD-10-CM | POA: Diagnosis not present

## 2016-05-04 DIAGNOSIS — I129 Hypertensive chronic kidney disease with stage 1 through stage 4 chronic kidney disease, or unspecified chronic kidney disease: Secondary | ICD-10-CM | POA: Diagnosis not present

## 2016-05-04 DIAGNOSIS — E1122 Type 2 diabetes mellitus with diabetic chronic kidney disease: Secondary | ICD-10-CM | POA: Diagnosis not present

## 2016-05-05 DIAGNOSIS — E038 Other specified hypothyroidism: Secondary | ICD-10-CM | POA: Diagnosis not present

## 2016-05-05 DIAGNOSIS — E782 Mixed hyperlipidemia: Secondary | ICD-10-CM | POA: Diagnosis not present

## 2016-05-05 DIAGNOSIS — N183 Chronic kidney disease, stage 3 (moderate): Secondary | ICD-10-CM | POA: Diagnosis not present

## 2016-05-05 DIAGNOSIS — I10 Essential (primary) hypertension: Secondary | ICD-10-CM | POA: Diagnosis not present

## 2016-05-05 DIAGNOSIS — E1165 Type 2 diabetes mellitus with hyperglycemia: Secondary | ICD-10-CM | POA: Diagnosis not present

## 2016-05-05 DIAGNOSIS — Z794 Long term (current) use of insulin: Secondary | ICD-10-CM | POA: Diagnosis not present

## 2016-05-05 DIAGNOSIS — E1122 Type 2 diabetes mellitus with diabetic chronic kidney disease: Secondary | ICD-10-CM | POA: Diagnosis not present

## 2016-05-06 DIAGNOSIS — I6350 Cerebral infarction due to unspecified occlusion or stenosis of unspecified cerebral artery: Secondary | ICD-10-CM | POA: Diagnosis not present

## 2016-05-06 DIAGNOSIS — N2581 Secondary hyperparathyroidism of renal origin: Secondary | ICD-10-CM | POA: Diagnosis not present

## 2016-05-06 DIAGNOSIS — I25119 Atherosclerotic heart disease of native coronary artery with unspecified angina pectoris: Secondary | ICD-10-CM | POA: Diagnosis not present

## 2016-05-06 DIAGNOSIS — E1129 Type 2 diabetes mellitus with other diabetic kidney complication: Secondary | ICD-10-CM | POA: Diagnosis not present

## 2016-05-06 DIAGNOSIS — N183 Chronic kidney disease, stage 3 (moderate): Secondary | ICD-10-CM | POA: Diagnosis not present

## 2016-05-06 DIAGNOSIS — R809 Proteinuria, unspecified: Secondary | ICD-10-CM | POA: Diagnosis not present

## 2016-05-06 DIAGNOSIS — E8779 Other fluid overload: Secondary | ICD-10-CM | POA: Diagnosis not present

## 2016-05-06 DIAGNOSIS — D631 Anemia in chronic kidney disease: Secondary | ICD-10-CM | POA: Diagnosis not present

## 2016-05-06 DIAGNOSIS — E039 Hypothyroidism, unspecified: Secondary | ICD-10-CM | POA: Diagnosis not present

## 2016-05-06 DIAGNOSIS — E782 Mixed hyperlipidemia: Secondary | ICD-10-CM | POA: Diagnosis not present

## 2016-05-06 DIAGNOSIS — E669 Obesity, unspecified: Secondary | ICD-10-CM | POA: Diagnosis not present

## 2016-05-06 DIAGNOSIS — I129 Hypertensive chronic kidney disease with stage 1 through stage 4 chronic kidney disease, or unspecified chronic kidney disease: Secondary | ICD-10-CM | POA: Diagnosis not present

## 2016-05-06 DIAGNOSIS — N184 Chronic kidney disease, stage 4 (severe): Secondary | ICD-10-CM | POA: Diagnosis not present

## 2016-05-10 DIAGNOSIS — G3184 Mild cognitive impairment, so stated: Secondary | ICD-10-CM | POA: Diagnosis not present

## 2016-05-10 DIAGNOSIS — E11649 Type 2 diabetes mellitus with hypoglycemia without coma: Secondary | ICD-10-CM | POA: Diagnosis not present

## 2016-05-10 DIAGNOSIS — N183 Chronic kidney disease, stage 3 (moderate): Secondary | ICD-10-CM | POA: Diagnosis not present

## 2016-05-10 DIAGNOSIS — I129 Hypertensive chronic kidney disease with stage 1 through stage 4 chronic kidney disease, or unspecified chronic kidney disease: Secondary | ICD-10-CM | POA: Diagnosis not present

## 2016-05-10 DIAGNOSIS — I2510 Atherosclerotic heart disease of native coronary artery without angina pectoris: Secondary | ICD-10-CM | POA: Diagnosis not present

## 2016-05-10 DIAGNOSIS — E1122 Type 2 diabetes mellitus with diabetic chronic kidney disease: Secondary | ICD-10-CM | POA: Diagnosis not present

## 2016-05-13 DIAGNOSIS — E1122 Type 2 diabetes mellitus with diabetic chronic kidney disease: Secondary | ICD-10-CM | POA: Diagnosis not present

## 2016-05-13 DIAGNOSIS — E11649 Type 2 diabetes mellitus with hypoglycemia without coma: Secondary | ICD-10-CM | POA: Diagnosis not present

## 2016-05-13 DIAGNOSIS — N183 Chronic kidney disease, stage 3 (moderate): Secondary | ICD-10-CM | POA: Diagnosis not present

## 2016-05-13 DIAGNOSIS — I129 Hypertensive chronic kidney disease with stage 1 through stage 4 chronic kidney disease, or unspecified chronic kidney disease: Secondary | ICD-10-CM | POA: Diagnosis not present

## 2016-05-13 DIAGNOSIS — I2510 Atherosclerotic heart disease of native coronary artery without angina pectoris: Secondary | ICD-10-CM | POA: Diagnosis not present

## 2016-05-13 DIAGNOSIS — G3184 Mild cognitive impairment, so stated: Secondary | ICD-10-CM | POA: Diagnosis not present

## 2016-07-20 DIAGNOSIS — E1121 Type 2 diabetes mellitus with diabetic nephropathy: Secondary | ICD-10-CM | POA: Diagnosis not present

## 2016-07-20 DIAGNOSIS — I10 Essential (primary) hypertension: Secondary | ICD-10-CM | POA: Diagnosis not present

## 2016-07-20 DIAGNOSIS — N183 Chronic kidney disease, stage 3 (moderate): Secondary | ICD-10-CM | POA: Diagnosis not present

## 2016-07-20 DIAGNOSIS — Z794 Long term (current) use of insulin: Secondary | ICD-10-CM | POA: Diagnosis not present

## 2016-07-20 DIAGNOSIS — E7800 Pure hypercholesterolemia, unspecified: Secondary | ICD-10-CM | POA: Diagnosis not present

## 2016-08-18 DIAGNOSIS — Z0000 Encounter for general adult medical examination without abnormal findings: Secondary | ICD-10-CM | POA: Diagnosis not present

## 2016-09-15 ENCOUNTER — Encounter: Payer: Self-pay | Admitting: Interventional Cardiology

## 2016-09-15 ENCOUNTER — Ambulatory Visit (INDEPENDENT_AMBULATORY_CARE_PROVIDER_SITE_OTHER): Payer: Medicare Other | Admitting: Interventional Cardiology

## 2016-09-15 ENCOUNTER — Encounter (INDEPENDENT_AMBULATORY_CARE_PROVIDER_SITE_OTHER): Payer: Self-pay

## 2016-09-15 VITALS — BP 150/50 | HR 68 | Ht 71.0 in | Wt 247.8 lb

## 2016-09-15 DIAGNOSIS — R9430 Abnormal result of cardiovascular function study, unspecified: Secondary | ICD-10-CM

## 2016-09-15 DIAGNOSIS — Z794 Long term (current) use of insulin: Secondary | ICD-10-CM | POA: Diagnosis not present

## 2016-09-15 DIAGNOSIS — E1159 Type 2 diabetes mellitus with other circulatory complications: Secondary | ICD-10-CM | POA: Diagnosis not present

## 2016-09-15 DIAGNOSIS — I209 Angina pectoris, unspecified: Secondary | ICD-10-CM | POA: Diagnosis not present

## 2016-09-15 DIAGNOSIS — I25118 Atherosclerotic heart disease of native coronary artery with other forms of angina pectoris: Secondary | ICD-10-CM

## 2016-09-15 DIAGNOSIS — I5032 Chronic diastolic (congestive) heart failure: Secondary | ICD-10-CM

## 2016-09-15 DIAGNOSIS — N289 Disorder of kidney and ureter, unspecified: Secondary | ICD-10-CM

## 2016-09-15 DIAGNOSIS — N189 Chronic kidney disease, unspecified: Secondary | ICD-10-CM

## 2016-09-15 MED ORDER — NITROGLYCERIN 0.4 MG SL SUBL: SUBLINGUAL_TABLET | SUBLINGUAL | 3 refills | Status: DC

## 2016-09-15 MED ORDER — ISOSORBIDE MONONITRATE ER 30 MG PO TB24: 30.0000 mg | ORAL_TABLET | Freq: Every day | ORAL | 3 refills | Status: DC

## 2016-09-15 NOTE — Progress Notes (Signed)
Patient ID: Luke [name], male   DOB: 1933-03-14, 83 y.o.   MRN: [id number]     Cardiology Office Note   Date:  09/15/2016   ID:  Aseel Uhde, DOB 1932/07/28, MRN [id number]  PCP:  Aretta Nip, MD    No chief complaint on file. CAD   Wt Readings from Last 3 Encounters:  09/15/16 247 lb 12.8 oz (112.4 kg)  10/21/15 245 lb (111.1 kg)  06/09/14 247 lb (112 kg)       History of Present Illness: Luke [name] is a 83 y.o. male  who has had CAD in the past, with CABG in 9/99 at [facility], [place]; LAD stent in 3/09, through the graft to the distal LAD.    Breathing is stable.  He takes his furosemide to diurese.  He has a persistent cough productive of mucus.   Prior revasc: Atherosclerotic heart disease with myocardial infarction 1999; S/P CABG x3 in 1999 (RIMA to LAD, LIMA to OM, SVG to PDA); S/P drug-eluting coronary stent to LAD in 3/09.  Luke [name] is still most strenuous activity. No angina or NTG use when doing these activities.  BP has been in the 130-865 systolic range typically.  Highest is in the 170s on occasion.  He will drink water and rest.  He rechecks and it is better.    He has had occasional chest discomfort if he "overexerts."  THis may happen once a week.  He has not had any NTG to use.   He can work for about 30 minutes before he has to stop.  He will rest for a few minutes and then can restart.  THis sx is different than what he has had in the past.      Past Medical History:  Diagnosis Date  . Anginal pain ([place]) 02/08/12  . Arthritis    "all over"  . Coronary artery disease   . Exertional dyspnea    "doesn't take much these days"  . H/O hiatal hernia   . Hyperlipemia   . Hypertension   . Hypothyroidism   . Stroke [facility]) 09/2010   denies residual  . Type II diabetes mellitus ([place])     Past Surgical History:  Procedure Laterality Date  . CATARACT EXTRACTION W/ INTRAOCULAR LENS  IMPLANT, BILATERAL  ~ 2007  . COLONOSCOPY W/  POLYPECTOMY  09/2011   "removed 7"  . CORONARY ANGIOPLASTY WITH STENT PLACEMENT  ~ 2004   "1"  . CORONARY ARTERY BYPASS GRAFT  1999   CABG X4  . EYE SURGERY    . RETINAL DETACHMENT SURGERY  10/2011   right eye     Current Outpatient Prescriptions  Medication Sig Dispense Refill  . amLODipine (NORVASC) 10 MG tablet TAKE 1 TABLET BY MOUTH ONCE DAILY 15 tablet 0  . aspirin EC 81 MG tablet Take 1 tablet (81 mg total) by mouth daily.    Marland Kitchen atenolol (TENORMIN) 25 MG tablet Take 25 mg by mouth daily.    Marland Kitchen atorvastatin (LIPITOR) 20 MG tablet Take 20 mg by mouth daily.    . furosemide (LASIX) 80 MG tablet Take 160 mg by mouth daily. 1 to 2 tabs daily    . insulin aspart (NOVOLOG) 100 UNIT/ML injection Inject 14-15 Units into the skin 3 (three) times daily with meals.     . insulin detemir (LEVEMIR) 100 UNIT/ML injection Inject 20-22 Units into the skin 2 (two) times daily.     Marland Kitchen levothyroxine (SYNTHROID, LEVOTHROID) 75 MCG tablet  Take 75 mcg by mouth daily.    . nitroGLYCERIN (NITROSTAT) 0.4 MG SL tablet Dissolve 1 tablet under the tongue every 5 minutes as needed for chest pain    . omeprazole (PRILOSEC) 20 MG capsule Take 1 capsule (20 mg total) by mouth daily. 14 capsule 0  . polyvinyl alcohol (LIQUIFILM TEARS) 1.4 % ophthalmic solution Place 1 drop into both eyes as needed (For dry eyes).     No current facility-administered medications for this visit.     Allergies:   Patient has no known allergies.    Social History:  The patient  reports that he quit smoking about 23 years ago. His smoking use included Cigarettes. He has a 70.00 pack-year smoking history. He quit smokeless tobacco use about 21 years ago. He reports that he does not drink alcohol or use drugs.   Family History:  The patient's   family history includes Heart attack in his mother.    ROS:  Please see the history of present illness.   Otherwise, review of systems are positive for joint pains.   All other systems are  reviewed and negative.    PHYSICAL EXAM: VS:  BP (!) 150/50   Pulse 68   Ht 5\' 11"  (1.803 m)   Wt 247 lb 12.8 oz (112.4 kg)   BMI 34.56 kg/m  , BMI Body mass index is 34.56 kg/m. GEN: Well nourished, well developed, in no acute distress  HEENT: normal  Neck: no JVD, carotid bruits, or masses Cardiac: RRR; no murmurs, rubs, or gallops,no edema  Respiratory:  clear to auscultation bilaterally, normal work of breathing GI: soft, nontender, nondistended, + BS, obese MS: no deformity or atrophy  Skin: warm and dry, no rash Neuro:  Strength and sensation are intact Psych: euthymic mood, full affect   EKG:   The ekg ordered today demonstrates NSR, prolonged PR, no ST segment changes   Recent Labs: No results found for requested labs within last 8760 hours.   Lipid Panel No results found for: CHOL, TRIG, HDL, CHOLHDL, VLDL, LDLCALC, LDLDIRECT   Other studies Reviewed: Additional studies/ records that were reviewed today with results demonstrating: prior cath records.   ASSESSMENT AND PLAN:  1. CAD: Restarted ASpirin 81 mg daily at last visit in 2017 after he stopped taking it. He takes it occasionally.  He wants a refill of SL NTG.  He is willing to try Imdur.  We discussed having another cath.  He does not feel that he needs a cath at this time.  Sx are minimal. 2. Hyperlipidemia: COntinue atorvastatin.  Last checked by Dr. Elyse Hsu next week.  Will obtain most recent lab results. Obtain most recent labs.  Known CRI. 3. Chronic diastolic failure: COntinue Lasix.  No heart failure sx at this time. Continue to try to avoid excess salt.  He does not add salt.  He eats out a few times/week and knows there is extra salt in this.   4. Abnormal stress test a few years ago.    Intensify medical therapy.  He has some renal dysfunction.  WOuld like to avoid cath if possible.  Start Imdur 30 daily as well.  He manages his sx well.  5. DM: Follwed by endocrinologist.    Current medicines  are reviewed at length with the patient today.  The patient concerns regarding his medicines were addressed.  The following changes have been made:  No change  Labs/ tests ordered today include:  No orders of the defined  types were placed in this encounter.   Recommend 150 minutes/week of aerobic exercise Low fat, low carb, high fiber diet recommended  Disposition:   FU in 4-6 weeks   Signed, Larae Grooms, MD  09/15/2016 9:13 AM    [facility] [place], [place], [place]  [address] Phone: [phone]; Fax: [phone]

## 2016-09-15 NOTE — Patient Instructions (Addendum)
Medication Instructions:  Start taking Imdur 30 mg daily. All other medications remain the same.  Labwork: None  Testing/Procedures: None  Follow-Up: Your physician recommends that you schedule a follow-up appointment in: 4 to 6 weeks.         If you need a refill on your cardiac medications before your next appointment, please call your pharmacy.

## 2016-09-16 ENCOUNTER — Other Ambulatory Visit: Payer: Self-pay

## 2016-09-16 DIAGNOSIS — I5032 Chronic diastolic (congestive) heart failure: Secondary | ICD-10-CM

## 2016-09-19 DIAGNOSIS — E039 Hypothyroidism, unspecified: Secondary | ICD-10-CM | POA: Diagnosis present

## 2016-09-19 DIAGNOSIS — E669 Obesity, unspecified: Secondary | ICD-10-CM | POA: Diagnosis not present

## 2016-09-19 DIAGNOSIS — Z794 Long term (current) use of insulin: Secondary | ICD-10-CM | POA: Diagnosis not present

## 2016-09-19 DIAGNOSIS — E1165 Type 2 diabetes mellitus with hyperglycemia: Secondary | ICD-10-CM | POA: Diagnosis not present

## 2016-09-19 DIAGNOSIS — N183 Chronic kidney disease, stage 3 unspecified: Secondary | ICD-10-CM | POA: Insufficient documentation

## 2016-09-19 DIAGNOSIS — E782 Mixed hyperlipidemia: Secondary | ICD-10-CM | POA: Diagnosis not present

## 2016-09-19 DIAGNOSIS — Z6834 Body mass index (BMI) 34.0-34.9, adult: Secondary | ICD-10-CM | POA: Diagnosis not present

## 2016-09-19 DIAGNOSIS — I129 Hypertensive chronic kidney disease with stage 1 through stage 4 chronic kidney disease, or unspecified chronic kidney disease: Secondary | ICD-10-CM | POA: Diagnosis not present

## 2016-09-19 DIAGNOSIS — N1831 Chronic kidney disease, stage 3a: Secondary | ICD-10-CM | POA: Insufficient documentation

## 2016-09-19 DIAGNOSIS — E1122 Type 2 diabetes mellitus with diabetic chronic kidney disease: Secondary | ICD-10-CM | POA: Diagnosis not present

## 2016-09-19 DIAGNOSIS — I2510 Atherosclerotic heart disease of native coronary artery without angina pectoris: Secondary | ICD-10-CM | POA: Diagnosis not present

## 2016-09-19 DIAGNOSIS — Z951 Presence of aortocoronary bypass graft: Secondary | ICD-10-CM | POA: Diagnosis not present

## 2016-10-25 NOTE — Progress Notes (Deleted)
Patient ID: Luke [name], male   DOB: Aug 31, 1932, 83 y.o.   MRN: [id number]     Cardiology Office Note   Date:  10/25/2016   ID:  Luke [name], DOB 1933-06-11, MRN [id number]  PCP:  Aretta Nip, MD    No chief complaint on file. CAD   Wt Readings from Last 3 Encounters:  09/15/16 247 lb 12.8 oz (112.4 kg)  10/21/15 245 lb (111.1 kg)  06/09/14 247 lb (112 kg)       History of Present Illness: Luke [name] is a 83 y.o. male  who has had CAD in the past, with CABG in 9/99 at [facility], [place]; LAD stent in 3/09, through the graft to the distal LAD.    Breathing is stable.  He takes his furosemide to diurese.  He has a persistent cough productive of mucus.   Prior revasc: Atherosclerotic heart disease with myocardial infarction 1999; S/P CABG x3 in 1999 (RIMA to LAD, LIMA to OM, SVG to PDA); S/P drug-eluting coronary stent to LAD in 3/09.  [name] is still most strenuous activity. No angina or NTG use when doing these activities.  BP has been in the 027-253 systolic range typically.  Highest is in the 170s on occasion.  He will drink water and rest.  He rechecks and it is better.    Last visit, he reported occasional chest discomfort if he "overexerts."  THis may happen once a week.  He has not had any NTG to use.   He can work for about 30 minutes before he has to stop.  He will rest for a few minutes and then can restart.  THis sx is different than what he has had in the past.  Since the last visit, he started Imdur.      Past Medical History:  Diagnosis Date  . Anginal pain ([place]) 02/08/12  . Arthritis    "all over"  . Coronary artery disease   . Exertional dyspnea    "doesn't take much these days"  . H/O hiatal hernia   . Hyperlipemia   . Hypertension   . Hypothyroidism   . Stroke [facility]) 09/2010   denies residual  . Type II diabetes mellitus ([place])     Past Surgical History:  Procedure Laterality Date  . CATARACT EXTRACTION W/  INTRAOCULAR LENS  IMPLANT, BILATERAL  ~ 2007  . COLONOSCOPY W/ POLYPECTOMY  09/2011   "removed 7"  . CORONARY ANGIOPLASTY WITH STENT PLACEMENT  ~ 2004   "1"  . CORONARY ARTERY BYPASS GRAFT  1999   CABG X4  . EYE SURGERY    . RETINAL DETACHMENT SURGERY  10/2011   right eye     Current Outpatient Prescriptions  Medication Sig Dispense Refill  . amLODipine (NORVASC) 10 MG tablet TAKE 1 TABLET BY MOUTH ONCE DAILY 15 tablet 0  . aspirin EC 81 MG tablet Take 1 tablet (81 mg total) by mouth daily.    Marland Kitchen atenolol (TENORMIN) 25 MG tablet Take 25 mg by mouth daily.    Marland Kitchen atorvastatin (LIPITOR) 20 MG tablet Take 20 mg by mouth daily.    . furosemide (LASIX) 80 MG tablet Take 160 mg by mouth daily. 1 to 2 tabs daily    . insulin aspart (NOVOLOG) 100 UNIT/ML injection Inject 14-15 Units into the skin 3 (three) times daily with meals.     . insulin detemir (LEVEMIR) 100 UNIT/ML injection Inject 20-22 Units into the skin 2 (two) times daily.     Marland Kitchen  isosorbide mononitrate (IMDUR) 30 MG 24 hr tablet Take 1 tablet (30 mg total) by mouth daily. 30 tablet 3  . levothyroxine (SYNTHROID, LEVOTHROID) 75 MCG tablet Take 75 mcg by mouth daily.    . nitroGLYCERIN (NITROSTAT) 0.4 MG SL tablet Dissolve 1 tablet under the tongue every 5 minutes as needed for chest pain 25 tablet 3  . omeprazole (PRILOSEC) 20 MG capsule Take 1 capsule (20 mg total) by mouth daily. 14 capsule 0  . polyvinyl alcohol (LIQUIFILM TEARS) 1.4 % ophthalmic solution Place 1 drop into both eyes as needed (For dry eyes).     No current facility-administered medications for this visit.     Allergies:   Patient has no known allergies.    Social History:  The patient  reports that he quit smoking about 23 years ago. His smoking use included Cigarettes. He has a 70.00 pack-year smoking history. He quit smokeless tobacco use about 21 years ago. He reports that he does not drink alcohol or use drugs.   Family History:  The patient's   family  history includes Heart attack in his mother.    ROS:  Please see the history of present illness.   Otherwise, review of systems are positive for joint pains.   All other systems are reviewed and negative.    PHYSICAL EXAM: VS:  There were no vitals taken for this visit. , BMI There is no height or weight on file to calculate BMI. GEN: Well nourished, well developed, in no acute distress  HEENT: normal  Neck: no JVD, carotid bruits, or masses Cardiac: RRR; no murmurs, rubs, or gallops,no edema  Respiratory:  clear to auscultation bilaterally, normal work of breathing GI: soft, nontender, nondistended, + BS, obese MS: no deformity or atrophy  Skin: warm and dry, no rash Neuro:  Strength and sensation are intact Psych: euthymic mood, full affect   EKG:   The ekg ordered today demonstrates NSR, prolonged PR, no ST segment changes   Recent Labs: No results found for requested labs within last 8760 hours.   Lipid Panel No results found for: CHOL, TRIG, HDL, CHOLHDL, VLDL, LDLCALC, LDLDIRECT   Other studies Reviewed: Additional studies/ records that were reviewed today with results demonstrating: prior cath records.   ASSESSMENT AND PLAN:  1. CAD: Restarted ASpirin 81 mg daily at last visit along with Imdur. He received a refill of SL NTG.  He is willing to try Imdur.  We discussed having another cath.  He declined cath at last visit.  Sx are minimal. 2. Hyperlipidemia: COntinue atorvastatin.  Last checked by Dr. Elyse Hsu next week.  Will obtain most recent lab results. Obtain most recent labs.  Known CRI. 3. Chronic diastolic failure: COntinue Lasix.  No heart failure sx at this time. Continue to try to avoid excess salt.  He does not add salt.  He eats out a few times/week and knows there is extra salt in this.   4. Abnormal stress test a few years ago.    Intensify medical therapy.  He has some renal dysfunction.  WOuld like to avoid cath if possible.  Start Imdur 30 daily as  well.  He manages his sx well.  5. DM: Follwed by endocrinologist.    Current medicines are reviewed at length with the patient today.  The patient concerns regarding his medicines were addressed.  The following changes have been made:  No change  Labs/ tests ordered today include:  No orders of the defined types were  placed in this encounter.   Recommend 150 minutes/week of aerobic exercise Low fat, low carb, high fiber diet recommended  Disposition:   FU in 4-6 weeks   Signed, Larae Grooms, MD  10/25/2016 7:50 PM    [facility] [place], [place], [place]  [address] Phone: [phone]; Fax: [phone]

## 2016-10-26 ENCOUNTER — Ambulatory Visit: Payer: Medicare Other | Admitting: Interventional Cardiology

## 2016-10-28 ENCOUNTER — Encounter: Payer: Self-pay | Admitting: Interventional Cardiology

## 2016-11-15 DIAGNOSIS — E669 Obesity, unspecified: Secondary | ICD-10-CM | POA: Diagnosis not present

## 2016-11-15 DIAGNOSIS — D631 Anemia in chronic kidney disease: Secondary | ICD-10-CM | POA: Diagnosis not present

## 2016-11-15 DIAGNOSIS — N2581 Secondary hyperparathyroidism of renal origin: Secondary | ICD-10-CM | POA: Diagnosis not present

## 2016-11-15 DIAGNOSIS — R809 Proteinuria, unspecified: Secondary | ICD-10-CM | POA: Diagnosis not present

## 2016-11-15 DIAGNOSIS — I129 Hypertensive chronic kidney disease with stage 1 through stage 4 chronic kidney disease, or unspecified chronic kidney disease: Secondary | ICD-10-CM | POA: Diagnosis not present

## 2016-11-15 DIAGNOSIS — N184 Chronic kidney disease, stage 4 (severe): Secondary | ICD-10-CM | POA: Diagnosis not present

## 2016-11-15 DIAGNOSIS — E782 Mixed hyperlipidemia: Secondary | ICD-10-CM | POA: Diagnosis not present

## 2016-11-15 DIAGNOSIS — E1129 Type 2 diabetes mellitus with other diabetic kidney complication: Secondary | ICD-10-CM | POA: Diagnosis not present

## 2016-11-15 DIAGNOSIS — I6350 Cerebral infarction due to unspecified occlusion or stenosis of unspecified cerebral artery: Secondary | ICD-10-CM | POA: Diagnosis not present

## 2016-11-15 DIAGNOSIS — I25119 Atherosclerotic heart disease of native coronary artery with unspecified angina pectoris: Secondary | ICD-10-CM | POA: Diagnosis not present

## 2016-11-15 DIAGNOSIS — N183 Chronic kidney disease, stage 3 (moderate): Secondary | ICD-10-CM | POA: Diagnosis not present

## 2016-11-15 DIAGNOSIS — E8779 Other fluid overload: Secondary | ICD-10-CM | POA: Diagnosis not present

## 2016-11-15 DIAGNOSIS — E039 Hypothyroidism, unspecified: Secondary | ICD-10-CM | POA: Diagnosis not present

## 2016-11-21 DIAGNOSIS — E039 Hypothyroidism, unspecified: Secondary | ICD-10-CM | POA: Diagnosis not present

## 2016-11-21 DIAGNOSIS — E1121 Type 2 diabetes mellitus with diabetic nephropathy: Secondary | ICD-10-CM | POA: Diagnosis not present

## 2016-11-21 DIAGNOSIS — Z794 Long term (current) use of insulin: Secondary | ICD-10-CM | POA: Diagnosis not present

## 2016-11-21 DIAGNOSIS — I10 Essential (primary) hypertension: Secondary | ICD-10-CM | POA: Diagnosis not present

## 2016-11-21 DIAGNOSIS — E7800 Pure hypercholesterolemia, unspecified: Secondary | ICD-10-CM | POA: Diagnosis not present

## 2016-11-25 ENCOUNTER — Ambulatory Visit (INDEPENDENT_AMBULATORY_CARE_PROVIDER_SITE_OTHER): Payer: Medicare Other | Admitting: Interventional Cardiology

## 2016-11-25 ENCOUNTER — Encounter: Payer: Self-pay | Admitting: Interventional Cardiology

## 2016-11-25 VITALS — BP 116/60 | HR 65 | Ht 71.0 in | Wt 247.0 lb

## 2016-11-25 DIAGNOSIS — N183 Chronic kidney disease, stage 3 unspecified: Secondary | ICD-10-CM

## 2016-11-25 DIAGNOSIS — Z794 Long term (current) use of insulin: Secondary | ICD-10-CM

## 2016-11-25 DIAGNOSIS — I25118 Atherosclerotic heart disease of native coronary artery with other forms of angina pectoris: Secondary | ICD-10-CM

## 2016-11-25 DIAGNOSIS — I209 Angina pectoris, unspecified: Secondary | ICD-10-CM | POA: Diagnosis not present

## 2016-11-25 DIAGNOSIS — R9430 Abnormal result of cardiovascular function study, unspecified: Secondary | ICD-10-CM | POA: Diagnosis not present

## 2016-11-25 DIAGNOSIS — E1159 Type 2 diabetes mellitus with other circulatory complications: Secondary | ICD-10-CM | POA: Diagnosis not present

## 2016-11-25 DIAGNOSIS — E782 Mixed hyperlipidemia: Secondary | ICD-10-CM | POA: Diagnosis not present

## 2016-11-25 DIAGNOSIS — I5032 Chronic diastolic (congestive) heart failure: Secondary | ICD-10-CM | POA: Diagnosis not present

## 2016-11-25 DIAGNOSIS — E875 Hyperkalemia: Secondary | ICD-10-CM | POA: Diagnosis not present

## 2016-11-25 NOTE — Patient Instructions (Addendum)

## 2016-11-25 NOTE — Progress Notes (Signed)
Patient ID: Luke [name], male   DOB: Aug 01, 1932, 83 y.o.   MRN: [id number]     Cardiology Office Note   Date:  11/25/2016   ID:  [name], DOB 07-03-1933, MRN [id number]  PCP:  Aretta Nip, MD    No chief complaint on file. CAD   Wt Readings from Last 3 Encounters:  11/25/16 247 lb (112 kg)  09/15/16 247 lb 12.8 oz (112.4 kg)  10/21/15 245 lb (111.1 kg)       History of Present Illness: Wael Maestas is a 83 y.o. male  who has had CAD in the past, with CABG in 9/99 at [facility], [place]; LAD stent in 3/09, through the graft to the distal LAD.    Breathing is stable.  He takes his furosemide to diurese.  He has a persistent cough productive of mucus.   Prior revasc: Atherosclerotic heart disease with myocardial infarction 1999; S/P CABG x3 in 1999 (RIMA to LAD, LIMA to OM, SVG to PDA); S/P drug-eluting coronary stent to LAD in 3/09.  [name] is still most strenuous activity. No angina or NTG use when doing these activities.  BP has been in the 989-211 systolic range typically.  Highest is in the 170s on occasion.  He will drink water and rest.  He rechecks and it is better.    Last visit, he reported occasional chest discomfort if he "overexerts."  THis may happen once a week.  He has not had any NTG to use.   He can work for about 30 minutes before he has to stop.  He will rest for a few minutes and then can restart.  THis sx is different than what he has had in the past.  Since the last visit, he started Imdur.  HIs brother passed away from [place] and he missed his last appt.  He went to [place] to be with family.  While he was there, he took his medicines and he had no cardiac sx.  No NTG use.    He has not been working in the yard except for mowing the grass.  No problems.        Past Medical History:  Diagnosis Date  . Anginal pain ([place]) 02/08/12  . Arthritis    "all over"  . Coronary artery disease   . Exertional dyspnea    "doesn't take much these days"  . H/O hiatal hernia   . Hyperlipemia   . Hypertension   . Hypothyroidism   . Stroke [facility]) 09/2010   denies residual  . Type II diabetes mellitus ([place])     Past Surgical History:  Procedure Laterality Date  . CATARACT EXTRACTION W/ INTRAOCULAR LENS  IMPLANT, BILATERAL  ~ 2007  . COLONOSCOPY W/ POLYPECTOMY  09/2011   "removed 7"  . CORONARY ANGIOPLASTY WITH STENT PLACEMENT  ~ 2004   "1"  . CORONARY ARTERY BYPASS GRAFT  1999   CABG X4  . EYE SURGERY    . RETINAL DETACHMENT SURGERY  10/2011   right eye     Current Outpatient Prescriptions  Medication Sig Dispense Refill  . amLODipine (NORVASC) 10 MG tablet TAKE 1 TABLET BY MOUTH ONCE DAILY 15 tablet 0  . aspirin EC 81 MG tablet Take 1 tablet (81 mg total) by mouth daily.    Marland Kitchen atenolol (TENORMIN) 25 MG tablet Take 25 mg by mouth daily.    Marland Kitchen atorvastatin (LIPITOR) 20 MG tablet Take 20 mg by mouth daily.    Marland Kitchen  furosemide (LASIX) 80 MG tablet Take 160 mg by mouth daily. 1 to 2 tabs daily    . insulin aspart (NOVOLOG) 100 UNIT/ML injection Inject 14-15 Units into the skin 3 (three) times daily with meals.     . insulin detemir (LEVEMIR) 100 UNIT/ML injection Inject 20-22 Units into the skin 2 (two) times daily.     . isosorbide mononitrate (IMDUR) 30 MG 24 hr tablet Take 1 tablet (30 mg total) by mouth daily. 30 tablet 3  . levothyroxine (SYNTHROID, LEVOTHROID) 75 MCG tablet Take 75 mcg by mouth daily.    . nitroGLYCERIN (NITROSTAT) 0.4 MG SL tablet Dissolve 1 tablet under the tongue every 5 minutes as needed for chest pain 25 tablet 3  . polyvinyl alcohol (LIQUIFILM TEARS) 1.4 % ophthalmic solution Place 1 drop into both eyes as needed (For dry eyes).     No current facility-administered medications for this visit.     Allergies:   Patient has no known allergies.    Social History:  The patient  reports that he quit smoking about 23 years ago. His smoking use included Cigarettes. He has a 70.00  pack-year smoking history. He quit smokeless tobacco use about 21 years ago. He reports that he does not drink alcohol or use drugs.   Family History:  The patient's   family history includes Heart attack in his mother.    ROS:  Please see the history of present illness.   Otherwise, review of systems are positive for joint pains.   All other systems are reviewed and negative.    PHYSICAL EXAM: VS:  BP 116/60   Pulse 65   Ht 5\' 11"  (1.803 m)   Wt 247 lb (112 kg)   SpO2 91%   BMI 34.45 kg/m  , BMI Body mass index is 34.45 kg/m. GEN: Well nourished, well developed, in no acute distress  HEENT: normal  Neck: no JVD, carotid bruits, or masses Cardiac: RRR; no murmurs, rubs, or gallops,no edema  Respiratory:  clear to auscultation bilaterally, normal work of breathing GI: soft, nontender, nondistended, + BS, obese MS: no deformity or atrophy  Skin: warm and dry, no rash Neuro:  Strength and sensation are intact Psych: euthymic mood, full affect   EKG:   The ekg ordered today demonstrates NSR, prolonged PR, no ST segment changes   Recent Labs: No results found for requested labs within last 8760 hours.   Lipid Panel No results found for: CHOL, TRIG, HDL, CHOLHDL, VLDL, LDLCALC, LDLDIRECT   Other studies Reviewed: Additional studies/ records that were reviewed today with results demonstrating: prior cath records; blood test pending from today   ASSESSMENT AND PLAN:  1. CAD: Restarted ASpirin 81 mg daily at last visit along with Imdur. He received a refill of SL NTG.  No CP on Imdur.  We discussed having another cath if he had refractory sx.  He declined cath at last visit.  Sx are improved.  WIll not pursue any further testing at this time.  2. Hyperlipidemia: COntinue atorvastatin.  Last checked by Dr. Elyse Hsu. 3. Chronic diastolic failure: COntinue Lasix.  No left sided heart failure sx at this time. Continue to try to avoid excess salt.  He does not add salt.  He eats out  a few times/week and knows there is extra salt in this.  Elevate legs to treat LE edema. 4. Abnormal stress test a few years ago.    Continue medical therapy.  He has some renal dysfunction.  Would like to avoid cath if possible.  Start Imdur 30 daily as well.  He manages his sx well.  5. DM: Follwed by endocrinologist.  6. He had some blood test this AM for his electrolytes and kidney function.    Current medicines are reviewed at length with the patient today.  The patient concerns regarding his medicines were addressed.  The following changes have been made:  No change  Labs/ tests ordered today include:  No orders of the defined types were placed in this encounter.   Recommend 150 minutes/week of aerobic exercise Low fat, low carb, high fiber diet recommended  Disposition:   FU in 6 months   Signed, Larae Grooms, MD  11/25/2016 11:21 AM    [facility] [place], [place], [place]  [address] Phone: [phone]; Fax: [phone]

## 2016-12-23 DIAGNOSIS — E782 Mixed hyperlipidemia: Secondary | ICD-10-CM | POA: Diagnosis not present

## 2016-12-23 DIAGNOSIS — Z794 Long term (current) use of insulin: Secondary | ICD-10-CM | POA: Diagnosis not present

## 2016-12-23 DIAGNOSIS — E039 Hypothyroidism, unspecified: Secondary | ICD-10-CM | POA: Diagnosis not present

## 2016-12-23 DIAGNOSIS — E1165 Type 2 diabetes mellitus with hyperglycemia: Secondary | ICD-10-CM | POA: Diagnosis not present

## 2016-12-27 DIAGNOSIS — E782 Mixed hyperlipidemia: Secondary | ICD-10-CM | POA: Diagnosis not present

## 2016-12-27 DIAGNOSIS — N183 Chronic kidney disease, stage 3 (moderate): Secondary | ICD-10-CM | POA: Diagnosis not present

## 2016-12-27 DIAGNOSIS — I129 Hypertensive chronic kidney disease with stage 1 through stage 4 chronic kidney disease, or unspecified chronic kidney disease: Secondary | ICD-10-CM | POA: Diagnosis not present

## 2016-12-27 DIAGNOSIS — Z794 Long term (current) use of insulin: Secondary | ICD-10-CM | POA: Diagnosis not present

## 2016-12-27 DIAGNOSIS — E039 Hypothyroidism, unspecified: Secondary | ICD-10-CM | POA: Diagnosis not present

## 2016-12-27 DIAGNOSIS — E1165 Type 2 diabetes mellitus with hyperglycemia: Secondary | ICD-10-CM | POA: Diagnosis not present

## 2016-12-27 DIAGNOSIS — E1122 Type 2 diabetes mellitus with diabetic chronic kidney disease: Secondary | ICD-10-CM | POA: Diagnosis not present

## 2017-01-12 ENCOUNTER — Other Ambulatory Visit: Payer: Self-pay | Admitting: Interventional Cardiology

## 2017-02-20 DIAGNOSIS — E669 Obesity, unspecified: Secondary | ICD-10-CM | POA: Diagnosis not present

## 2017-02-20 DIAGNOSIS — D631 Anemia in chronic kidney disease: Secondary | ICD-10-CM | POA: Diagnosis not present

## 2017-02-20 DIAGNOSIS — R809 Proteinuria, unspecified: Secondary | ICD-10-CM | POA: Diagnosis not present

## 2017-02-20 DIAGNOSIS — E1129 Type 2 diabetes mellitus with other diabetic kidney complication: Secondary | ICD-10-CM | POA: Diagnosis not present

## 2017-02-20 DIAGNOSIS — I129 Hypertensive chronic kidney disease with stage 1 through stage 4 chronic kidney disease, or unspecified chronic kidney disease: Secondary | ICD-10-CM | POA: Diagnosis not present

## 2017-02-20 DIAGNOSIS — I6350 Cerebral infarction due to unspecified occlusion or stenosis of unspecified cerebral artery: Secondary | ICD-10-CM | POA: Diagnosis not present

## 2017-02-20 DIAGNOSIS — I25119 Atherosclerotic heart disease of native coronary artery with unspecified angina pectoris: Secondary | ICD-10-CM | POA: Diagnosis not present

## 2017-02-20 DIAGNOSIS — E039 Hypothyroidism, unspecified: Secondary | ICD-10-CM | POA: Diagnosis not present

## 2017-02-20 DIAGNOSIS — E782 Mixed hyperlipidemia: Secondary | ICD-10-CM | POA: Diagnosis not present

## 2017-02-20 DIAGNOSIS — N2581 Secondary hyperparathyroidism of renal origin: Secondary | ICD-10-CM | POA: Diagnosis not present

## 2017-02-20 DIAGNOSIS — E8779 Other fluid overload: Secondary | ICD-10-CM | POA: Diagnosis not present

## 2017-02-20 DIAGNOSIS — N183 Chronic kidney disease, stage 3 (moderate): Secondary | ICD-10-CM | POA: Diagnosis not present

## 2017-03-06 ENCOUNTER — Telehealth: Payer: Self-pay | Admitting: Interventional Cardiology

## 2017-03-06 NOTE — Telephone Encounter (Signed)
Pt aware nurse will call him once reviewed with Dr. Irish Lack.  (pt aware they are not in the office today)

## 2017-03-06 NOTE — Telephone Encounter (Signed)
New message      Calling to see if pt need to have labs drawn prior to 05-10-17 ov with Dr Irish Lack?

## 2017-03-07 NOTE — Telephone Encounter (Signed)
Contacted Dr. Altheimer's office, Endo.  They drew CMP and TSH in June.  They require a request form be sent over. Message sent to medical records to send requests to Endo and PCP to obtain labs.

## 2017-03-14 ENCOUNTER — Telehealth: Payer: Self-pay | Admitting: Interventional Cardiology

## 2017-03-14 NOTE — Telephone Encounter (Signed)
Records received from [facility] placed in Chart Prep.

## 2017-03-23 DIAGNOSIS — Z23 Encounter for immunization: Secondary | ICD-10-CM | POA: Diagnosis not present

## 2017-03-28 DIAGNOSIS — Z794 Long term (current) use of insulin: Secondary | ICD-10-CM | POA: Diagnosis not present

## 2017-03-28 DIAGNOSIS — E039 Hypothyroidism, unspecified: Secondary | ICD-10-CM | POA: Diagnosis not present

## 2017-03-28 DIAGNOSIS — E1165 Type 2 diabetes mellitus with hyperglycemia: Secondary | ICD-10-CM | POA: Diagnosis not present

## 2017-03-28 DIAGNOSIS — E782 Mixed hyperlipidemia: Secondary | ICD-10-CM | POA: Diagnosis not present

## 2017-03-30 DIAGNOSIS — Z6836 Body mass index (BMI) 36.0-36.9, adult: Secondary | ICD-10-CM | POA: Diagnosis not present

## 2017-03-30 DIAGNOSIS — I129 Hypertensive chronic kidney disease with stage 1 through stage 4 chronic kidney disease, or unspecified chronic kidney disease: Secondary | ICD-10-CM | POA: Diagnosis not present

## 2017-03-30 DIAGNOSIS — E1122 Type 2 diabetes mellitus with diabetic chronic kidney disease: Secondary | ICD-10-CM | POA: Diagnosis not present

## 2017-03-30 DIAGNOSIS — E039 Hypothyroidism, unspecified: Secondary | ICD-10-CM | POA: Diagnosis not present

## 2017-03-30 DIAGNOSIS — Z794 Long term (current) use of insulin: Secondary | ICD-10-CM | POA: Diagnosis not present

## 2017-03-30 DIAGNOSIS — E782 Mixed hyperlipidemia: Secondary | ICD-10-CM | POA: Diagnosis not present

## 2017-03-30 DIAGNOSIS — E1165 Type 2 diabetes mellitus with hyperglycemia: Secondary | ICD-10-CM | POA: Diagnosis not present

## 2017-03-30 DIAGNOSIS — E669 Obesity, unspecified: Secondary | ICD-10-CM | POA: Diagnosis not present

## 2017-03-30 DIAGNOSIS — N183 Chronic kidney disease, stage 3 (moderate): Secondary | ICD-10-CM | POA: Diagnosis not present

## 2017-05-10 ENCOUNTER — Ambulatory Visit (INDEPENDENT_AMBULATORY_CARE_PROVIDER_SITE_OTHER): Payer: Medicare Other | Admitting: Interventional Cardiology

## 2017-05-10 ENCOUNTER — Encounter: Payer: Self-pay | Admitting: Interventional Cardiology

## 2017-05-10 VITALS — BP 114/52 | HR 66 | Ht 71.0 in | Wt 247.0 lb

## 2017-05-10 DIAGNOSIS — E1159 Type 2 diabetes mellitus with other circulatory complications: Secondary | ICD-10-CM | POA: Diagnosis not present

## 2017-05-10 DIAGNOSIS — I10 Essential (primary) hypertension: Secondary | ICD-10-CM | POA: Diagnosis not present

## 2017-05-10 DIAGNOSIS — I209 Angina pectoris, unspecified: Secondary | ICD-10-CM

## 2017-05-10 DIAGNOSIS — I25118 Atherosclerotic heart disease of native coronary artery with other forms of angina pectoris: Secondary | ICD-10-CM | POA: Diagnosis not present

## 2017-05-10 DIAGNOSIS — E782 Mixed hyperlipidemia: Secondary | ICD-10-CM

## 2017-05-10 NOTE — Patient Instructions (Signed)
Medication Instructions:  Your physician recommends that you continue on your current medications as directed. Please refer to the Current Medication list given to you today.   Labwork: None ordered  Testing/Procedures: None ordered  Follow-Up: Your physician wants you to follow-up in: July 2019 with Dr. Irish Lack. You will receive a reminder letter in the mail two months in advance. If you don't receive a letter, please call our office to schedule the follow-up appointment.   Any Other Special Instructions Will Be Listed Below (If Applicable).     If you need a refill on your cardiac medications before your next appointment, please call your pharmacy.

## 2017-05-10 NOTE — Progress Notes (Signed)
Cardiology Office Note   Date:  05/10/2017   ID:  Luke [name], DOB 11/07/1932, MRN [id number]  PCP:  Aretta Nip, MD    No chief complaint on file. CAD   Wt Readings from Last 3 Encounters:  05/10/17 247 lb (112 kg)  11/25/16 247 lb (112 kg)  09/15/16 247 lb 12.8 oz (112.4 kg)       History of Present Illness: Luke [name] is a 83 y.o. male  who has had CAD in the past, with CABG in 9/99 at [facility], [place]; LAD stent in 3/09, through the graft to the distal LAD.   Prior revasc: Atherosclerotic heart disease with myocardial infarction 1999; S/P CABG x3 in 1999 (RIMA to LAD, LIMA to OM, SVG to PDA); S/P drug-eluting coronary stent to LAD in 3/09.  Luke [name] is his most strenuous activity.  No NTG use with that activity.  He gets tired with that and sits down for a break.  BP has been good at home as well.    Denies : Chest pain. Dizziness. recent Nitroglycerin use. Orthopnea. Palpitations. Paroxysmal nocturnal dyspnea. Shortness of breath. Syncope.  Overall, he feels that he is doing well.  He thinks he is slowing down due to age.  His A1C was 9.1 in June.  He is working on this with diet control.      Past Medical History:  Diagnosis Date  . Anginal pain ([place]) 02/08/12  . Arthritis    "all over"  . Coronary artery disease   . Exertional dyspnea    "doesn't take much these days"  . H/O hiatal hernia   . Hyperlipemia   . Hypertension   . Hypothyroidism   . Stroke [facility]) 09/2010   denies residual  . Type II diabetes mellitus ([place])     Past Surgical History:  Procedure Laterality Date  . CATARACT EXTRACTION W/ INTRAOCULAR LENS  IMPLANT, BILATERAL  ~ 2007  . COLONOSCOPY W/ POLYPECTOMY  09/2011   "removed 7"  . CORONARY ANGIOPLASTY WITH STENT PLACEMENT  ~ 2004   "1"  . CORONARY ARTERY BYPASS GRAFT  1999   CABG X4  . EYE SURGERY    . RETINAL DETACHMENT SURGERY  10/2011   right eye     Current Outpatient Prescriptions    Medication Sig Dispense Refill  . amLODipine (NORVASC) 5 MG tablet Take 5 mg by mouth daily.  1  . aspirin EC 81 MG tablet Take 1 tablet (81 mg total) by mouth daily.    Marland Kitchen atenolol (TENORMIN) 25 MG tablet Take 25 mg by mouth daily.    Marland Kitchen atorvastatin (LIPITOR) 20 MG tablet Take 20 mg by mouth daily.    . furosemide (LASIX) 80 MG tablet Take 160 mg by mouth daily. 1 to 2 tabs daily    . insulin aspart (NOVOLOG) 100 UNIT/ML injection Inject 14-15 Units into the skin 3 (three) times daily with meals.     . insulin detemir (LEVEMIR) 100 UNIT/ML injection Inject 20-22 Units into the skin 2 (two) times daily.     Marland Kitchen levothyroxine (SYNTHROID, LEVOTHROID) 75 MCG tablet Take 75 mcg by mouth daily.    . nitroGLYCERIN (NITROSTAT) 0.4 MG SL tablet Dissolve 1 tablet under the tongue every 5 minutes as needed for chest pain 25 tablet 3  . polyvinyl alcohol (LIQUIFILM TEARS) 1.4 % ophthalmic solution Place 1 drop into both eyes as needed (For dry eyes).    . isosorbide mononitrate (IMDUR) 30 MG 24 hr  tablet TAKE 1 TABLET (30 MG TOTAL) BY MOUTH DAILY. 30 tablet 11   No current facility-administered medications for this visit.     Allergies:   Patient has no known allergies.    Social History:  The patient  reports that he quit smoking about 23 years ago. His smoking use included Cigarettes. He has a 70.00 pack-year smoking history. He quit smokeless tobacco use about 21 years ago. He reports that he does not drink alcohol or use drugs.   Family History:  The patient's family history includes Heart attack in his mother.    ROS:  Please see the history of present illness.   Otherwise, review of systems are positive for slowing down.   All other systems are reviewed and negative.    PHYSICAL EXAM: VS:  BP (!) 114/52 (BP Location: Right Arm, Patient Position: Sitting, Cuff Size: Large)   Pulse 66   Ht 5\' 11"  (1.803 m)   Wt 247 lb (112 kg)   SpO2 91%   BMI 34.45 kg/m  , BMI Body mass index is 34.45  kg/m. GEN: Well nourished, well developed, in no acute distress  HEENT: normal  Neck: no JVD, carotid bruits, or masses Cardiac: RRR; no murmurs, rubs, or gallops,; 1+ bilateral LE edema  Respiratory:  clear to auscultation bilaterally, normal work of breathing GI: soft, nontender, nondistended, + BS MS: no deformity or atrophy  Skin: warm and dry, no rash Neuro:  Strength and sensation are intact Psych: euthymic mood, full affect    Recent Labs: No results found for requested labs within last 8760 hours.   Lipid Panel No results found for: CHOL, TRIG, HDL, CHOLHDL, VLDL, LDLCALC, LDLDIRECT   Other studies Reviewed: Additional studies/ records that were reviewed today with results demonstrating: .   ASSESSMENT AND PLAN:  1. CAD: COntinue aggressive secondary prevention.   Angina controlled on medical therapy. 2. Hyperlipidemia: Continue lipid-lowering therapy. LDL 75 at last check with primary care doctor. He will need fasting labs at his next physical. 3. DM: We spoke about diet and exercise to try to help lower his blood sugar. 4. HTN: COntinue BP lowering meds.    Current medicines are reviewed at length with the patient today.  The patient concerns regarding his medicines were addressed.  The following changes have been made:  No change  Labs/ tests ordered today include:  No orders of the defined types were placed in this encounter.   Recommend 150 minutes/week of aerobic exercise Low fat, low carb, high fiber diet recommended  Disposition:   FU in 9 months   Signed, Larae Grooms, MD  05/10/2017 10:33 AM    [facility] [place], [place], [place]  [address] Phone: [phone]; Fax: [phone]

## 2017-05-24 DIAGNOSIS — E1121 Type 2 diabetes mellitus with diabetic nephropathy: Secondary | ICD-10-CM | POA: Diagnosis not present

## 2017-05-24 DIAGNOSIS — I10 Essential (primary) hypertension: Secondary | ICD-10-CM | POA: Diagnosis not present

## 2017-05-24 DIAGNOSIS — E039 Hypothyroidism, unspecified: Secondary | ICD-10-CM | POA: Diagnosis not present

## 2017-05-24 DIAGNOSIS — N183 Chronic kidney disease, stage 3 (moderate): Secondary | ICD-10-CM | POA: Diagnosis not present

## 2017-05-24 DIAGNOSIS — Z794 Long term (current) use of insulin: Secondary | ICD-10-CM | POA: Diagnosis not present

## 2017-05-24 DIAGNOSIS — E7800 Pure hypercholesterolemia, unspecified: Secondary | ICD-10-CM | POA: Diagnosis not present

## 2017-06-12 DIAGNOSIS — E782 Mixed hyperlipidemia: Secondary | ICD-10-CM | POA: Diagnosis not present

## 2017-06-12 DIAGNOSIS — E039 Hypothyroidism, unspecified: Secondary | ICD-10-CM | POA: Diagnosis not present

## 2017-06-12 DIAGNOSIS — E1165 Type 2 diabetes mellitus with hyperglycemia: Secondary | ICD-10-CM | POA: Diagnosis not present

## 2017-06-12 DIAGNOSIS — Z794 Long term (current) use of insulin: Secondary | ICD-10-CM | POA: Diagnosis not present

## 2017-06-29 DIAGNOSIS — E1122 Type 2 diabetes mellitus with diabetic chronic kidney disease: Secondary | ICD-10-CM | POA: Diagnosis not present

## 2017-06-29 DIAGNOSIS — I129 Hypertensive chronic kidney disease with stage 1 through stage 4 chronic kidney disease, or unspecified chronic kidney disease: Secondary | ICD-10-CM | POA: Diagnosis not present

## 2017-06-29 DIAGNOSIS — N183 Chronic kidney disease, stage 3 (moderate): Secondary | ICD-10-CM | POA: Diagnosis not present

## 2017-06-29 DIAGNOSIS — E039 Hypothyroidism, unspecified: Secondary | ICD-10-CM | POA: Diagnosis not present

## 2017-06-29 DIAGNOSIS — Z794 Long term (current) use of insulin: Secondary | ICD-10-CM | POA: Diagnosis not present

## 2017-06-29 DIAGNOSIS — E1165 Type 2 diabetes mellitus with hyperglycemia: Secondary | ICD-10-CM | POA: Diagnosis not present

## 2017-06-29 DIAGNOSIS — E782 Mixed hyperlipidemia: Secondary | ICD-10-CM | POA: Diagnosis not present

## 2017-08-10 DIAGNOSIS — N183 Chronic kidney disease, stage 3 (moderate): Secondary | ICD-10-CM | POA: Diagnosis not present

## 2017-08-10 DIAGNOSIS — N2581 Secondary hyperparathyroidism of renal origin: Secondary | ICD-10-CM | POA: Diagnosis not present

## 2017-08-10 DIAGNOSIS — E039 Hypothyroidism, unspecified: Secondary | ICD-10-CM | POA: Diagnosis not present

## 2017-08-10 DIAGNOSIS — E782 Mixed hyperlipidemia: Secondary | ICD-10-CM | POA: Diagnosis not present

## 2017-08-10 DIAGNOSIS — I25119 Atherosclerotic heart disease of native coronary artery with unspecified angina pectoris: Secondary | ICD-10-CM | POA: Diagnosis not present

## 2017-08-10 DIAGNOSIS — N184 Chronic kidney disease, stage 4 (severe): Secondary | ICD-10-CM | POA: Diagnosis not present

## 2017-08-10 DIAGNOSIS — D631 Anemia in chronic kidney disease: Secondary | ICD-10-CM | POA: Diagnosis not present

## 2017-08-10 DIAGNOSIS — I129 Hypertensive chronic kidney disease with stage 1 through stage 4 chronic kidney disease, or unspecified chronic kidney disease: Secondary | ICD-10-CM | POA: Diagnosis not present

## 2017-08-10 DIAGNOSIS — I6350 Cerebral infarction due to unspecified occlusion or stenosis of unspecified cerebral artery: Secondary | ICD-10-CM | POA: Diagnosis not present

## 2017-08-10 DIAGNOSIS — E1122 Type 2 diabetes mellitus with diabetic chronic kidney disease: Secondary | ICD-10-CM | POA: Diagnosis not present

## 2017-08-10 DIAGNOSIS — E669 Obesity, unspecified: Secondary | ICD-10-CM | POA: Diagnosis not present

## 2017-08-10 DIAGNOSIS — Z9119 Patient's noncompliance with other medical treatment and regimen: Secondary | ICD-10-CM | POA: Diagnosis not present

## 2017-08-10 DIAGNOSIS — R809 Proteinuria, unspecified: Secondary | ICD-10-CM | POA: Diagnosis not present

## 2017-08-30 ENCOUNTER — Emergency Department (HOSPITAL_COMMUNITY): Payer: Medicare Other

## 2017-08-30 ENCOUNTER — Emergency Department (HOSPITAL_COMMUNITY)
Admission: EM | Admit: 2017-08-30 | Discharge: 2017-08-30 | Discharge status: Against Medical Advice | Payer: Medicare Other | Attending: Emergency Medicine | Admitting: Emergency Medicine

## 2017-08-30 ENCOUNTER — Encounter (HOSPITAL_COMMUNITY): Payer: Self-pay

## 2017-08-30 DIAGNOSIS — Z5321 Procedure and treatment not carried out due to patient leaving prior to being seen by health care provider: Secondary | ICD-10-CM | POA: Diagnosis not present

## 2017-08-30 DIAGNOSIS — R0602 Shortness of breath: Secondary | ICD-10-CM | POA: Diagnosis not present

## 2017-08-30 DIAGNOSIS — R42 Dizziness and giddiness: Secondary | ICD-10-CM | POA: Diagnosis not present

## 2017-08-30 LAB — BASIC METABOLIC PANEL
Anion gap: 11 (ref 5–15)
BUN: 21 mg/dL — ABNORMAL HIGH (ref 6–20)
CO2: 25 mmol/L (ref 22–32)
Calcium: 9.1 mg/dL (ref 8.9–10.3)
Chloride: 104 mmol/L (ref 101–111)
Creatinine, Ser: 1.19 mg/dL (ref 0.61–1.24)
GFR calc Af Amer: >60 mL/min (ref >60)
GFR calc non Af Amer: 55 mL/min — ABNORMAL LOW (ref >60)
Glucose, Bld: 75 mg/dL (ref 65–99)
Potassium: 4.7 mmol/L (ref 3.5–5.1)
Sodium: 140 mmol/L (ref 135–145)

## 2017-08-30 LAB — CBC
HCT: 46.8 % (ref 39.0–52.0)
Hemoglobin: 15.5 g/dL (ref 13.0–17.0)
MCH: 30 pg (ref 26.0–34.0)
MCHC: 33.1 g/dL (ref 30.0–36.0)
MCV: 90.5 fL (ref 78.0–100.0)
Platelets: 202 10*3/uL (ref 150–400)
RBC: 5.17 MIL/uL (ref 4.22–5.81)
RDW: 12.6 % (ref 11.5–15.5)
WBC: 7.5 10*3/uL (ref 4.0–10.5)

## 2017-08-30 LAB — I-STAT TROPONIN, ED: Troponin i, poc: 0 ng/mL (ref 0.00–0.08)

## 2017-08-30 LAB — CBG MONITORING, ED: Glucose-Capillary: 138 mg/dL — ABNORMAL HIGH (ref 65–99)

## 2017-08-30 NOTE — ED Triage Notes (Signed)
Patient complains of CP with dizziness and SOB increasing x 2 days. Reports that the dizziness is resolved when he is sitting. Patient slightly pale. Alert and oriented. Reports that the CP is minimal on arrival

## 2017-08-30 NOTE — ED Notes (Signed)
Pt remains in waiting room. Updated on wait for treatment room. 

## 2017-08-30 NOTE — ED Notes (Signed)
Per Almyra Free, NT pt left already.

## 2017-08-30 NOTE — ED Triage Notes (Signed)
Pt.'s O2 was 87, states he does not wear O2 at home, placed him on a nasal cannula-- sats at 100 now.

## 2017-08-31 ENCOUNTER — Telehealth: Payer: Self-pay | Admitting: Interventional Cardiology

## 2017-08-31 NOTE — Telephone Encounter (Signed)
Patient wanting to report that he was seen in the ER yesterday for SOB, chest pain, and dizziness for 2 days. He states that they drew labs and did a CXR. Patient states that he left before being seen and wanted to know what his labs and CXR showed. Prelim results reviewed with patient. Patient states that they placed him on O2 while he was in the hospital. Patient wanted to make Dr. Irish Lack aware and requested that he review his tests. Patient denies having any CP, SOB, dizziness, or any other symptoms today and states that he feels much better. Patient already scheduled to see Melina Copa, PA on 2/21. Advised patient to keep appointment and let us know if his symptoms return. Patient verbalized understanding and thanked me for the call.

## 2017-08-31 NOTE — Telephone Encounter (Signed)
New message    Patient calling, states he went to ED on 08/31/17. Patient left without being seen after several hours. Requesting Dr Irish Lack look at testing from ED. Please call   Pt c/o Shortness Of Breath: STAT if SOB developed within the last 24 hours or pt is noticeably SOB on the phone  1. Are you currently SOB (can you hear that pt is SOB on the phone)? NO  2. How long have you been experiencing SOB? 2 days  3. Are you SOB when sitting or when up moving around? Moving around  4. Are you currently experiencing any other symptoms? NO

## 2017-09-07 NOTE — Telephone Encounter (Signed)
Patient made aware.

## 2017-09-07 NOTE — Telephone Encounter (Signed)
No problems detected on tests done in the ER including CXR, ECG and troponin.  CONtinue to monitor sx.

## 2017-09-12 ENCOUNTER — Encounter: Payer: Self-pay | Admitting: Physician Assistant

## 2017-09-12 NOTE — Progress Notes (Addendum)
Cardiology Office Note    Date:  09/14/2017  ID:  Luke [name], DOB [birth year], MRN [id number] PCP:  Aretta Nip, MD  Cardiologist:  Dr. Irish Lack   Chief Complaint: shortness of breath   History of Present Illness:  Luke [name] is a 84 y.o. 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   Patient presented to the ER 2/6 for SOB and dizziness. He reports he had been feeling this way for about 2 days. He says he ran out of a few medicines but doesn't remember today which ones. O2 sat was 87%. Triage labs drawn showed negative troponin, normal CBC, K 4.7, BUN 21, Cr 1.19 (previously 1.25 in 12/2016 scanned). ER EKG limited by baseline wander, TWI I, avL, nonspecific ST abnormality V5-V6 similar to prior. CXR NAD. He states he sat in the ER for several hours and left without being seen. Last labs otherwise in 2018 showed LDL 75, AST/ALT OK. Last echo 2011 did not report out LVEF. Last stress test 2013 with mild ischemia in apical lateral region, EF 70%, consider cath for symptoms. Dr. Irish Lack has discussed cath with patient in the past for recurrent CP when he "overexerts" himself but noted he would like to avoid if possible due to some renal dysfunction; the patient also did not want to proceed previously.  Luke [name] returns with his wife for follow-up today and is overall feeling better. He feels like he's back to his baseline. He does note dyspnea on exertion and chest tightness if he "overdoes it" - takes 1 SL NTG and it improves right away. He has to take about 3-4 per month. This is completely unchanged for him. He does notice intermittent wheezing that is not present by exam today. Pulse ox 91% in clinic today which  appears to be same as 2 prior visits with Dr. Irish Lack in 2018. He states he felt fine today walking into clinic from the parking garage to the office. He was not noted to be acutely SOB while ambulating but took his time. Weight is down 5lb but without specific intervention. He takes 1 Lasix daily.   Past Medical History:  Diagnosis Date  . Anginal pain ([place]) 02/08/12  . Arthritis    "all over"  . CKD (chronic kidney disease), stage II   . Coronary artery disease    a. MI/CABG in 9/99 at [facility], [place] with ([place] to LAD, LIMA to OM, SVG to PDA. b. LAD drug eluting stent in 3/09 through the graft to the distal LAD.  Marland Kitchen Exertional dyspnea    "doesn't take much these days"  . H/O hiatal hernia   . Hyperlipemia   . Hypertension   . Hypothyroidism   . Stroke [facility]) 09/2010   denies residual  . Type II diabetes mellitus ([place])     Past Surgical History:  Procedure Laterality Date  . CATARACT EXTRACTION W/ INTRAOCULAR LENS  IMPLANT, BILATERAL  ~ 2007  . COLONOSCOPY W/ POLYPECTOMY  09/2011   "removed 7"  . CORONARY ANGIOPLASTY WITH STENT PLACEMENT  ~ 2004   "1"  . CORONARY ARTERY BYPASS GRAFT  1999   CABG X4  . EYE SURGERY    . RETINAL DETACHMENT  SURGERY  10/2011   right eye    Current Medications: Current Meds  Medication Sig  . amLODipine (NORVASC) 5 MG tablet Take 5 mg by mouth daily.  Marland Kitchen aspirin EC 81 MG tablet Take 1 tablet (81 mg total) by mouth daily.  Marland Kitchen atenolol (TENORMIN) 25 MG tablet Take 25 mg by mouth daily.  Marland Kitchen atorvastatin (LIPITOR) 20 MG tablet Take 1 tablet (20 mg total) by mouth daily.  . furosemide (LASIX) 80 MG tablet Take 160 mg by mouth daily. 1 to 2 tabs daily  . insulin aspart (NOVOLOG) 100 UNIT/ML injection Inject 14-15 Units into the skin 3 (three) times daily with meals.   . insulin detemir (LEVEMIR) 100 UNIT/ML injection Inject 20-22 Units into the skin 2 (two) times daily.   Marland Kitchen levothyroxine (SYNTHROID, LEVOTHROID) 75 MCG tablet Take 75 mcg  by mouth daily.  . nitroGLYCERIN (NITROSTAT) 0.4 MG SL tablet Dissolve 1 tablet under the tongue every 5 minutes as needed for chest pain  . polyvinyl alcohol (LIQUIFILM TEARS) 1.4 % ophthalmic solution Place 1 drop into both eyes as needed (For dry eyes).  . [DISCONTINUED] atorvastatin (LIPITOR) 20 MG tablet Take 20 mg by mouth daily.    Allergies:   Patient has no known allergies.   Social History   Socioeconomic History  . Marital status: Married    Spouse name: None  . Number of children: None  . Years of education: None  . Highest education level: None  Social Needs  . Financial resource strain: None  . Food insecurity - worry: None  . Food insecurity - inability: None  . Transportation needs - medical: None  . Transportation needs - non-medical: None  Occupational History  . None  Tobacco Use  . Smoking status: Former Smoker    Packs/day: 2.00    Years: 35.00    Pack years: 70.00    Types: Cigarettes    Last attempt to quit: 07/25/1993    Years since quitting: 24.1  . Smokeless tobacco: Former Systems developer    Quit date: 07/18/1995  Substance and Sexual Activity  . Alcohol use: No    Comment: 02/08/12 "stopped ~ 1995"  . Drug use: No  . Sexual activity: No  Other Topics Concern  . None  Social History Narrative  . None     Family History:  Family History  Problem Relation Age of Onset  . Heart attack Mother     ROS:   Please see the history of present illness.  All other systems are reviewed and otherwise negative.    PHYSICAL EXAM:   VS:  BP 136/64   Pulse 70   Ht 5\' 11"  (1.803 m)   Wt 242 lb (109.8 kg)   SpO2 91%   BMI 33.75 kg/m   BMI: Body mass index is 33.75 kg/m. GEN: Well nourished, well developed obese WM, in no acute distress  HEENT: normocephalic, atraumatic Neck: no JVD, carotid bruits, or masses Cardiac: RRR; no murmurs, rubs, or gallops, trace soft bilateral LE edema  Respiratory:  Diffusely diminished BS throughout without wheezes, rales or  rhonchi, mildly increased WOB when he speaks in long winded sentences (breaks them up) GI: soft, nontender, rounded, + BS MS: no deformity or atrophy  Skin: warm and dry, no rash Neuro:  Alert and Oriented x 3, Strength and sensation are intact, follows commands Psych: euthymic mood, full affect  Wt Readings from Last 3 Encounters:  09/14/17 242 lb (109.8 kg)  05/10/17 247 lb (112  kg)  11/25/16 247 lb (112 kg)      Studies/Labs Reviewed:   EKG:  EKG was ordered today and personally reviewed by me and demonstrates NSR 70bpm 1st degree AVB no acute ST-T changes, TWI I, avL similar to prior.  Recent Labs: 08/30/2017: BUN 21; Creatinine, Ser 1.19; Hemoglobin 15.5; Platelets 202; Potassium 4.7; Sodium 140   Additional studies/ records that were reviewed today include: Summarized above    ASSESSMENT & PLAN:   1. Dyspnea with recent hypoxic value noted in the emergency room - his pulse ox is back to 91% today which appears his baseline since 11/2016. The acute on chronic dyspnea occurred in the setting of running out of a few of his medicines (he's not sure which ones). He reports feeling back to baseline, which for him means about 3-4 episodes per month of increased SOB and chest discomfort with "overexertion." His lung sounds are decreased throughout. I suspect there is a component of COPD from 70 pack year history. He is not actively wheezing. Will check BNP to help guide treatment. If elevated, will try a trial of increased diuretic. If normal, will plan to increase Imdur to 60mg  daily to see if we can further decrease anginal-type episodes. Will also order 2D echocardiogram. Regardless of the above, I think he should get in to see pulmonary for formal input on COPD and baseline decreased O2 sats. While that referral is pending, I've asked him to see his PCP to determine if he would benefit from any initiation of therapy. Warning sx reviewed with patient. 2. CAD - as above. Prior history of  abnormal stress test. Previous notes outline what sounds like stable angina. He reports this is back to his baseline.Continue ASA, beta blocker. Consideration could be given to changing atenolol to more selective beta blocker if symptoms recur despite above measures. He is not actively wheezing. Dr. Irish Lack has maintained him on current dose of atorvastatin. 3. HTN - blood pressure is currently reasonable; follow with anticipated med change. 4. CKD II - recheck BMET today.  Disposition: F/u with Dr. Lillia Pauls in 3-4 weeks on a day when Dr. Irish Lack is in clinic.   Medication Adjustments/Labs and Tests Ordered: Current medicines are reviewed at length with the patient today.  Concerns regarding medicines are outlined above. Medication changes, Labs and Tests ordered today are summarized above and listed in the Patient Instructions accessible in Encounters.   Signed, Charlie Pitter, PA-C  09/14/2017 3:23 PM    [facility] [address], [place], [place]  [address] Phone: [phone]; Fax: [phone]

## 2017-09-14 ENCOUNTER — Ambulatory Visit (INDEPENDENT_AMBULATORY_CARE_PROVIDER_SITE_OTHER): Payer: Medicare Other | Admitting: Physician Assistant

## 2017-09-14 ENCOUNTER — Encounter: Payer: Self-pay | Admitting: Physician Assistant

## 2017-09-14 VITALS — BP 136/64 | HR 70 | Ht 71.0 in | Wt 242.0 lb

## 2017-09-14 DIAGNOSIS — I2510 Atherosclerotic heart disease of native coronary artery without angina pectoris: Secondary | ICD-10-CM | POA: Diagnosis not present

## 2017-09-14 DIAGNOSIS — I10 Essential (primary) hypertension: Secondary | ICD-10-CM

## 2017-09-14 DIAGNOSIS — R0602 Shortness of breath: Secondary | ICD-10-CM | POA: Diagnosis not present

## 2017-09-14 DIAGNOSIS — R0902 Hypoxemia: Secondary | ICD-10-CM | POA: Diagnosis not present

## 2017-09-14 DIAGNOSIS — N182 Chronic kidney disease, stage 2 (mild): Secondary | ICD-10-CM | POA: Diagnosis not present

## 2017-09-14 DIAGNOSIS — I5032 Chronic diastolic (congestive) heart failure: Secondary | ICD-10-CM | POA: Diagnosis not present

## 2017-09-14 MED ORDER — ATORVASTATIN CALCIUM 20 MG PO TABS: 20.0000 mg | ORAL_TABLET | Freq: Every day | ORAL | 3 refills | Status: DC

## 2017-09-14 NOTE — Patient Instructions (Addendum)
Medication Instructions:  Your physician recommends that you continue on your current medications as directed. Please refer to the Current Medication list given to you today.   Labwork: TODAY:  PRO BNP & BMET  Testing/Procedures: Your physician has requested that you have an echocardiogram. Echocardiography is a painless test that uses sound waves to create images of your heart. It provides your doctor with information about the size and shape of your heart and how well your heart's chambers and valves are working. This procedure takes approximately one hour. There are no restrictions for this procedure.    You have been referred to [place].   Follow-Up:  Your physician recommends that you schedule a follow-up appointment in: 10/10/17 ARRIVE AT 1:15 TO SEE DAYNA DUNN, PA-C   Any Other Special Instructions Will Be Listed Below (If Applicable). Follow-up with your Family [facility] Dr in the meantime to discuss possible COPD   Echocardiogram An echocardiogram, or echocardiography, uses sound waves (ultrasound) to produce an image of your heart. The echocardiogram is simple, painless, obtained within a short period of time, and offers valuable information to your health care provider. The images from an echocardiogram can provide information such as:  Evidence of coronary artery disease (CAD).  Heart size.  Heart muscle function.  Heart valve function.  Aneurysm detection.  Evidence of a past heart attack.  Fluid buildup around the heart.  Heart muscle thickening.  Assess heart valve function.  Tell a health care provider about:  Any allergies you have.  All medicines you are taking, including vitamins, herbs, eye drops, creams, and over-the-counter medicines.  Any problems you or family members have had with anesthetic medicines.  Any blood disorders you have.  Any surgeries you have had.  Any medical conditions you have.  Whether you are pregnant or may  be pregnant. What happens before the procedure? No special preparation is needed. Eat and drink normally. What happens during the procedure?  In order to produce an image of your heart, gel will be applied to your chest and a wand-like tool (transducer) will be moved over your chest. The gel will help transmit the sound waves from the transducer. The sound waves will harmlessly bounce off your heart to allow the heart images to be captured in real-time motion. These images will then be recorded.  You may need an IV to receive a medicine that improves the quality of the pictures. What happens after the procedure? You may return to your normal schedule including diet, activities, and medicines, unless your health care provider tells you otherwise. This information is not intended to replace advice given to you by your health care provider. Make sure you discuss any questions you have with your health care provider. Document Released: 07/08/2000 Document Revised: 02/27/2016 Document Reviewed: 03/18/2013 Elsevier Interactive Patient Education  2017 [employer].  If you need a refill on your cardiac medications before your next appointment, please call your pharmacy.

## 2017-09-15 ENCOUNTER — Telehealth: Payer: Self-pay | Admitting: *Deleted

## 2017-09-15 LAB — PRO B NATRIURETIC PEPTIDE: NT-Pro BNP: 443 pg/mL (ref 0–486)

## 2017-09-15 LAB — BASIC METABOLIC PANEL
BUN/Creatinine Ratio: 20 (ref 10–24)
BUN: 29 mg/dL — ABNORMAL HIGH (ref 8–27)
CO2: 26 mmol/L (ref 20–29)
Calcium: 8.9 mg/dL (ref 8.6–10.2)
Chloride: 99 mmol/L (ref 96–106)
Creatinine, Ser: 1.43 mg/dL — ABNORMAL HIGH (ref 0.76–1.27)
GFR calc Af Amer: 52 mL/min/{1.73_m2} — ABNORMAL LOW (ref >59)
GFR calc non Af Amer: 45 mL/min/{1.73_m2} — ABNORMAL LOW (ref >59)
Glucose: 181 mg/dL — ABNORMAL HIGH (ref 65–99)
Potassium: 4.9 mmol/L (ref 3.5–5.2)
Sodium: 142 mmol/L (ref 134–144)

## 2017-09-15 MED ORDER — ISOSORBIDE MONONITRATE ER 60 MG PO TB24: 60.0000 mg | ORAL_TABLET | Freq: Every day | ORAL | 3 refills | Status: DC

## 2017-09-15 NOTE — Telephone Encounter (Signed)
-----   Message from Charlie Pitter, [place] sent at 09/15/2017 11:28 AM EST ----- Please let patient know labs appear stable, volume status appears relatively OK. Kidney function chronically abnormal but consistent with prior values. Recommend increase Imdur to 60mg  daily. I want him to f/u with PCP within the next few days to assess his walking oxygen level and discuss whether anything needs to be initiated for COPD prior to pulm appt. Dayna Dunn PA-C

## 2017-09-18 ENCOUNTER — Institutional Professional Consult (permissible substitution): Payer: Medicare Other | Admitting: Pulmonary Disease

## 2017-09-18 ENCOUNTER — Ambulatory Visit (INDEPENDENT_AMBULATORY_CARE_PROVIDER_SITE_OTHER): Payer: Medicare Other | Admitting: Emergency Medicine

## 2017-09-18 ENCOUNTER — Telehealth: Payer: Self-pay | Admitting: Interventional Cardiology

## 2017-09-18 ENCOUNTER — Encounter: Payer: Self-pay | Admitting: Emergency Medicine

## 2017-09-18 VITALS — BP 120/60 | HR 74 | Ht 71.0 in | Wt 244.0 lb

## 2017-09-18 DIAGNOSIS — R0902 Hypoxemia: Secondary | ICD-10-CM | POA: Diagnosis not present

## 2017-09-18 DIAGNOSIS — J449 Chronic obstructive pulmonary disease, unspecified: Secondary | ICD-10-CM | POA: Diagnosis not present

## 2017-09-18 DIAGNOSIS — I2510 Atherosclerotic heart disease of native coronary artery without angina pectoris: Secondary | ICD-10-CM

## 2017-09-18 NOTE — Patient Instructions (Signed)
Please start Stiolto 2 puffs once a day. Your oxygen level with exertion was low today.  You will likely benefit from wearing oxygen.  We will revisit this at your next visit after we have started you on inhaler medication. We will perform pulmonary function testing at your next office visit to compare with 2015. Follow with Dr Lamonte Sakai in 1 month with full PFT on the same day

## 2017-09-18 NOTE — Assessment & Plan Note (Signed)
Obstructive lung disease confirmed on his pulmonary function testing from 2015.  Severe obstruction with some possible superimposed restriction at that time.  He needs to be treated with bronchodilator therapy to see if he has a benefit.  We also need to repeat his pulmonary function testing to compare with his priors.  Finally we need to address his hypoxemia.  He is hopeful that he will not require oxygen.  I told him that he almost certainly will but that we can wait until after he is on a stable bronchodilator regimen to make the final determination.

## 2017-09-18 NOTE — Assessment & Plan Note (Signed)
Documented today with exertion.  He will almost certainly qualify for and require exertional oxygen.  We discussed today and he wants to avoid if at all possible.  I will start him on bronchodilators as above, but I suspect that his hypoxemia will persist.  We will check it at his next visit.

## 2017-09-18 NOTE — Telephone Encounter (Signed)
Note not needed 

## 2017-09-18 NOTE — Progress Notes (Signed)
Subjective:    Patient ID: Luke [name], male    DOB: 1933/02/15, 84 y.o.   MRN: [id number]  HPI 84 year old former smoker (26 pack years) with a history of coronary disease and CABG, diabetes, CVA, hypertension with diastolic CHF, chronic kidney disease and a hiatal hernia.  Followed by cardiology, appears to have some exertional angina that responds to nitroglycerin.  He has been under evaluation for exertional dyspnea and found to have hypoxemia. He reports that he began to have exercise limitation over a year ago. He does not cough frequently. Has some sinus gtt. Has heard wheeze before.   He was 87% after ambulating to the exam room today on room air.   Review of Systems  Constitutional: Negative for fever and unexpected weight change.  HENT: Negative for congestion, dental problem, ear pain, nosebleeds, postnasal drip, rhinorrhea, sinus pressure, sneezing, sore throat and trouble swallowing.   Eyes: Negative for redness and itching.  Respiratory: Positive for cough, chest tightness, shortness of breath and wheezing.   Cardiovascular: Negative for palpitations and leg swelling.  Gastrointestinal: Negative for nausea and vomiting.  Genitourinary: Negative for dysuria.  Musculoskeletal: Negative for joint swelling.  Skin: Negative for rash.  Neurological: Negative for headaches.  Hematological: Does not bruise/bleed easily.  Psychiatric/Behavioral: Negative for dysphoric mood. The patient is not nervous/anxious.     Past Medical History:  Diagnosis Date  . Anginal pain ([place]) 02/08/12  . Arthritis    "all over"  . CKD (chronic kidney disease), stage II   . Coronary artery disease    a. MI/CABG in 9/99 at [facility], [place] with (RIM[place] to LAD, LIMA to OM, SVG to PDA. b. LAD drug eluting stent in 3/09 through the graft to the distal LAD.  Marland Kitchen Exertional dyspnea    "doesn't take much these days"  . H/O hiatal hernia   . Hyperlipemia   . Hypertension   .  Hypothyroidism   . Stroke [facility]) 09/2010   denies residual  . Type II diabetes mellitus ([place])      Family History  Problem Relation Age of Onset  . Heart attack Mother      Social History   Socioeconomic History  . Marital status: Married    Spouse name: Not on file  . Number of children: Not on file  . Years of education: Not on file  . Highest education level: Not on file  Social Needs  . Financial resource strain: Not on file  . Food insecurity - worry: Not on file  . Food insecurity - inability: Not on file  . Transportation needs - medical: Not on file  . Transportation needs - non-medical: Not on file  Occupational History  . Not on file  Tobacco Use  . Smoking status: Former Smoker    Packs/day: 2.00    Years: 35.00    Pack years: 70.00    Types: Cigarettes    Last attempt to quit: 07/25/1993    Years since quitting: 24.1  . Smokeless tobacco: Former Systems developer    Quit date: 07/18/1995  Substance and Sexual Activity  . Alcohol use: No    Comment: 02/08/12 "stopped ~ 1995"  . Drug use: No  . Sexual activity: No  Other Topics Concern  . Not on file  Social History Narrative  . 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  He was in the [employer], [employer], went to [place], [place]. Has lived in [place] as  well.   No Known Allergies   Outpatient Medications Prior to Visit  Medication Sig Dispense Refill  . amLODipine (NORVASC) 5 MG tablet Take 5 mg by mouth daily.  1  . aspirin EC 81 MG tablet Take 1 tablet (81 mg total) by mouth daily.    Marland Kitchen atenolol (TENORMIN) 25 MG tablet Take 25 mg by mouth daily.    Marland Kitchen atorvastatin (LIPITOR) 20 MG tablet Take 1 tablet (20 mg total) by mouth daily. 90 tablet 3  . furosemide (LASIX) 80 MG tablet Take 160 mg by mouth daily. 1 to 2 tabs daily    . insulin aspart (NOVOLOG) 100 UNIT/ML injection Inject 14-15 Units into the skin 3 (three) times daily with meals.     . insulin detemir (LEVEMIR) 100  UNIT/ML injection Inject 20-22 Units into the skin 2 (two) times daily.     . isosorbide mononitrate (IMDUR) 60 MG 24 hr tablet Take 1 tablet (60 mg total) by mouth daily. 90 tablet 3  . levothyroxine (SYNTHROID, LEVOTHROID) 75 MCG tablet Take 75 mcg by mouth daily.    . nitroGLYCERIN (NITROSTAT) 0.4 MG SL tablet Dissolve 1 tablet under the tongue every 5 minutes as needed for chest pain 25 tablet 3  . polyvinyl alcohol (LIQUIFILM TEARS) 1.4 % ophthalmic solution Place 1 drop into both eyes as needed (For dry eyes).     No facility-administered medications prior to visit.         Objective:   Physical Exam Vitals:   09/18/17 1458  BP: 120/60  Pulse: 74  SpO2: 95%  Weight: 244 lb (110.7 kg)  Height: 5\' 11"  (1.803 m)   Gen: Pleasant, well-nourished, in no distress,  normal affect  ENT: No lesions,  mouth clear,  oropharynx clear, no postnasal drip  Neck: No JVD, no stridor  Lungs: No use of accessory muscles, very distant, clear without rales or rhonchi  Cardiovascular: RRR, heart sounds normal, no murmur or gallops, no peripheral edema  Musculoskeletal: No deformities, no cyanosis or clubbing  Neuro: alert, non focal  Skin: Warm, no lesions or rashes    CXR 08/30/17 --  COMPARISON:  09/10/2014  FINDINGS: There is no focal parenchymal opacity. There is no pleural effusion or pneumothorax. The heart and mediastinal contours are unremarkable. There is evidence of prior CABG.  The osseous structures are unremarkable.  IMPRESSION: No active cardiopulmonary disease.      Assessment & Plan:  COPD (chronic obstructive pulmonary disease) ([place]) Obstructive lung disease confirmed on his pulmonary function testing from 2015.  Severe obstruction with some possible superimposed restriction at that time.  He needs to be treated with bronchodilator therapy to see if he has a benefit.  We also need to repeat his pulmonary function testing to compare with his priors.  Finally we  need to address his hypoxemia.  He is hopeful that he will not require oxygen.  I told him that he almost certainly will but that we can wait until after he is on a stable bronchodilator regimen to make the final determination.  Hypoxemia Documented today with exertion.  He will almost certainly qualify for and require exertional oxygen.  We discussed today and he wants to avoid if at all possible.  I will start him on bronchodilators as above, but I suspect that his hypoxemia will persist.  We will check it at his next visit.  Baltazar Apo, MD, PhD 09/18/2017, 3:36 PM [facility] [phone] or if no answer [phone]

## 2017-09-19 DIAGNOSIS — E039 Hypothyroidism, unspecified: Secondary | ICD-10-CM | POA: Diagnosis not present

## 2017-09-19 DIAGNOSIS — Z794 Long term (current) use of insulin: Secondary | ICD-10-CM | POA: Diagnosis not present

## 2017-09-19 DIAGNOSIS — E782 Mixed hyperlipidemia: Secondary | ICD-10-CM | POA: Diagnosis not present

## 2017-09-19 DIAGNOSIS — E1165 Type 2 diabetes mellitus with hyperglycemia: Secondary | ICD-10-CM | POA: Diagnosis not present

## 2017-09-21 ENCOUNTER — Other Ambulatory Visit (HOSPITAL_COMMUNITY): Payer: Medicare Other

## 2017-09-25 DIAGNOSIS — L0390 Cellulitis, unspecified: Secondary | ICD-10-CM | POA: Diagnosis not present

## 2017-09-25 DIAGNOSIS — R609 Edema, unspecified: Secondary | ICD-10-CM | POA: Diagnosis not present

## 2017-09-26 DIAGNOSIS — E039 Hypothyroidism, unspecified: Secondary | ICD-10-CM | POA: Diagnosis not present

## 2017-09-26 DIAGNOSIS — N183 Chronic kidney disease, stage 3 (moderate): Secondary | ICD-10-CM | POA: Diagnosis not present

## 2017-09-26 DIAGNOSIS — I252 Old myocardial infarction: Secondary | ICD-10-CM | POA: Diagnosis not present

## 2017-09-26 DIAGNOSIS — E782 Mixed hyperlipidemia: Secondary | ICD-10-CM | POA: Diagnosis not present

## 2017-09-26 DIAGNOSIS — Z7982 Long term (current) use of aspirin: Secondary | ICD-10-CM | POA: Diagnosis not present

## 2017-09-26 DIAGNOSIS — Z794 Long term (current) use of insulin: Secondary | ICD-10-CM | POA: Diagnosis not present

## 2017-09-26 DIAGNOSIS — I2510 Atherosclerotic heart disease of native coronary artery without angina pectoris: Secondary | ICD-10-CM | POA: Diagnosis not present

## 2017-09-26 DIAGNOSIS — E1165 Type 2 diabetes mellitus with hyperglycemia: Secondary | ICD-10-CM | POA: Diagnosis not present

## 2017-09-26 DIAGNOSIS — I129 Hypertensive chronic kidney disease with stage 1 through stage 4 chronic kidney disease, or unspecified chronic kidney disease: Secondary | ICD-10-CM | POA: Diagnosis not present

## 2017-09-26 DIAGNOSIS — E1121 Type 2 diabetes mellitus with diabetic nephropathy: Secondary | ICD-10-CM | POA: Diagnosis not present

## 2017-09-26 DIAGNOSIS — E1122 Type 2 diabetes mellitus with diabetic chronic kidney disease: Secondary | ICD-10-CM | POA: Diagnosis not present

## 2017-09-26 DIAGNOSIS — Z955 Presence of coronary angioplasty implant and graft: Secondary | ICD-10-CM | POA: Diagnosis not present

## 2017-10-03 DIAGNOSIS — L0390 Cellulitis, unspecified: Secondary | ICD-10-CM | POA: Diagnosis not present

## 2017-10-10 ENCOUNTER — Ambulatory Visit: Payer: Medicare Other | Admitting: Physician Assistant

## 2017-10-17 DIAGNOSIS — L0390 Cellulitis, unspecified: Secondary | ICD-10-CM | POA: Diagnosis not present

## 2017-10-24 ENCOUNTER — Ambulatory Visit (INDEPENDENT_AMBULATORY_CARE_PROVIDER_SITE_OTHER): Payer: Medicare Other | Admitting: Emergency Medicine

## 2017-10-24 ENCOUNTER — Encounter: Payer: Self-pay | Admitting: Emergency Medicine

## 2017-10-24 DIAGNOSIS — J449 Chronic obstructive pulmonary disease, unspecified: Secondary | ICD-10-CM

## 2017-10-24 DIAGNOSIS — I2510 Atherosclerotic heart disease of native coronary artery without angina pectoris: Secondary | ICD-10-CM

## 2017-10-24 LAB — PULMONARY FUNCTION TEST
DL/VA % pred: 79 %
DL/VA: 3.63 ml/min/mmHg/L
DLCO cor % pred: 54 %
DLCO cor: 17.67 ml/min/mmHg
DLCO unc % pred: 55 %
DLCO unc: 18.1 ml/min/mmHg
FEF 25-75 Post: 0.97 L/sec
FEF 25-75 Pre: 0.65 L/sec
FEF2575-%Change-Post: 49 %
FEF2575-%Pred-Post: 52 %
FEF2575-%Pred-Pre: 34 %
FEV1-%Change-Post: 14 %
FEV1-%Pred-Post: 56 %
FEV1-%Pred-Pre: 49 %
FEV1-Post: 1.56 L
FEV1-Pre: 1.36 L
FEV1FVC-%Change-Post: 5 %
FEV1FVC-%Pred-Pre: 80 %
FEV6-%Change-Post: 7 %
FEV6-%Pred-Post: 70 %
FEV6-%Pred-Pre: 65 %
FEV6-Post: 2.57 L
FEV6-Pre: 2.38 L
FEV6FVC-%Change-Post: -1 %
FEV6FVC-%Pred-Post: 106 %
FEV6FVC-%Pred-Pre: 107 %
FVC-%Change-Post: 9 %
FVC-%Pred-Post: 66 %
FVC-%Pred-Pre: 60 %
FVC-Post: 2.61 L
FVC-Pre: 2.39 L
Post FEV1/FVC ratio: 60 %
Post FEV6/FVC ratio: 99 %
Pre FEV1/FVC ratio: 57 %
Pre FEV6/FVC Ratio: 100 %

## 2017-10-24 MED ORDER — UMECLIDINIUM-VILANTEROL 62.5-25 MCG/INH IN AEPB: 1.0000 | INHALATION_SPRAY | Freq: Every day | RESPIRATORY_TRACT | 0 refills | Status: DC

## 2017-10-24 NOTE — Addendum Note (Signed)
Addended by: Elton Sin on: 10/24/2017 04:56 PM   Modules accepted: Orders

## 2017-10-24 NOTE — Patient Instructions (Addendum)
Please start Anoro once a day Call our office to let us know whether you have benefited from this medication.  If so then we will send a prescription to your pharmacy and continue it. Follow with Dr Lamonte Sakai in 3 months or sooner if you have any problems.

## 2017-10-24 NOTE — Progress Notes (Signed)
Subjective:    Patient ID: Luke [name], male    DOB: 31-Mar-1933, 84 y.o.   MRN: [id number]  HPI 84 year old former smoker (41 pack years) with a history of coronary disease and CABG, diabetes, CVA, hypertension with diastolic CHF, chronic kidney disease and a hiatal hernia.  Followed by cardiology, appears to have some exertional angina that responds to nitroglycerin.  He has been under evaluation for exertional dyspnea and found to have hypoxemia. He reports that he began to have exercise limitation over a year ago. He does not cough frequently. Has some sinus gtt. Has heard wheeze before.   He was 87% after ambulating to the exam room today on room air.   ROV 10/24/17 --follow-up visit for patient with a history of coronary disease, CVA, hypertension with diastolic dysfunction, tobacco use and presumed COPD.  He underwent pulmonary function testing today that shows severe obstruction (FEV1 49% predicted) with a positive bronchodilator response.  His lung volumes were not done, his diffusion capacity was decreased and did not fully correct when adjusted for his alveolar volume.  At our last visit I started him on Stiolto to see if he would benefit. He is unsure whether it helped him but he is also unsure whether he took it correctly.    Review of Systems  Constitutional: Negative for fever and unexpected weight change.  HENT: Negative for congestion, dental problem, ear pain, nosebleeds, postnasal drip, rhinorrhea, sinus pressure, sneezing, sore throat and trouble swallowing.   Eyes: Negative for redness and itching.  Respiratory: Positive for cough, chest tightness, shortness of breath and wheezing.   Cardiovascular: Negative for palpitations and leg swelling.  Gastrointestinal: Negative for nausea and vomiting.  Genitourinary: Negative for dysuria.  Musculoskeletal: Negative for joint swelling.  Skin: Negative for rash.  Neurological: Negative for headaches.  Hematological: Does not  bruise/bleed easily.  Psychiatric/Behavioral: Negative for dysphoric mood. The patient is not nervous/anxious.     Past Medical History:  Diagnosis Date  . Anginal pain ([place]) 02/08/12  . Arthritis    "all over"  . CKD (chronic kidney disease), stage II   . Coronary artery disease    a. MI/CABG in 9/99 at [facility], [place] with (RIMA to LAD, LIMA to OM, SVG to PDA. b. LAD drug eluting stent in 3/09 through the graft to the distal LAD.  Marland Kitchen Exertional dyspnea    "doesn't take much these days"  . H/O hiatal hernia   . Hyperlipemia   . Hypertension   . Hypothyroidism   . Stroke [facility]) 09/2010   denies residual  . Type II diabetes mellitus ([place])      Family History  Problem Relation Age of Onset  . Heart attack Mother      Social History   Socioeconomic History  . Marital status: Married    Spouse name: Not on file  . Number of children: Not on file  . Years of education: Not on file  . Highest education level: Not on file  Occupational History  . Not on file  Social Needs  . Financial resource strain: Not on file  . Food insecurity:    Worry: Not on file    Inability: Not on file  . Transportation needs:    Medical: Not on file    Non-medical: Not on file  Tobacco Use  . Smoking status: Former Smoker    Packs/day: 2.00    Years: 35.00    Pack years: 70.00    Types: Cigarettes  Last attempt to quit: 07/25/1993    Years since quitting: 24.2  . Smokeless tobacco: Former Systems developer    Quit date: 07/18/1995  Substance and Sexual Activity  . Alcohol use: No    Comment: 02/08/12 "stopped ~ 1995"  . Drug use: No  . Sexual activity: Never  Lifestyle  . Physical activity:    Days per week: Not on file    Minutes per session: Not on file  . Stress: Not on file  Relationships  . Social connections:    Talks on phone: Not on file    Gets together: Not on file    Attends religious service: Not on file    Active member of club or organization: Not on file     Attends meetings of clubs or organizations: Not on file    Relationship status: Not on file  . Intimate partner violence:    Fear of current or ex partner: Not on file    Emotionally abused: Not on file    Physically abused: Not on file    Forced sexual activity: Not on file  Other Topics Concern  . Not on file  Social History Narrative  . 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  He was in the [employer], [employer], went to [place], [place]. Has lived in [place] as well.   No Known Allergies   Outpatient Medications Prior to Visit  Medication Sig Dispense Refill  . amLODipine (NORVASC) 5 MG tablet Take 5 mg by mouth daily.  1  . aspirin EC 81 MG tablet Take 1 tablet (81 mg total) by mouth daily.    Marland Kitchen atenolol (TENORMIN) 25 MG tablet Take 25 mg by mouth daily.    Marland Kitchen atorvastatin (LIPITOR) 20 MG tablet Take 1 tablet (20 mg total) by mouth daily. 90 tablet 3  . furosemide (LASIX) 80 MG tablet Take 160 mg by mouth daily. 1 to 2 tabs daily    . insulin aspart (NOVOLOG) 100 UNIT/ML injection Inject 14-15 Units into the skin 3 (three) times daily with meals.     . insulin detemir (LEVEMIR) 100 UNIT/ML injection Inject 20-22 Units into the skin 2 (two) times daily.     . isosorbide mononitrate (IMDUR) 60 MG 24 hr tablet Take 1 tablet (60 mg total) by mouth daily. 90 tablet 3  . levothyroxine (SYNTHROID, LEVOTHROID) 75 MCG tablet Take 75 mcg by mouth daily.    . nitroGLYCERIN (NITROSTAT) 0.4 MG SL tablet Dissolve 1 tablet under the tongue every 5 minutes as needed for chest pain 25 tablet 3  . polyvinyl alcohol (LIQUIFILM TEARS) 1.4 % ophthalmic solution Place 1 drop into both eyes as needed (For dry eyes).     No facility-administered medications prior to visit.         Objective:   Physical Exam Vitals:   10/24/17 1555  BP: (!) 156/76  Pulse: 74  SpO2: 92%  Weight: 240 lb (108.9 kg)  Height: 5\' 10"  (1.778 m)   Gen: Pleasant,  well-nourished, in no distress,  normal affect  ENT: No lesions,  mouth clear,  oropharynx clear, no postnasal drip  Neck: No JVD, no stridor  Lungs: No use of accessory muscles, very distant, clear without rales or rhonchi  Cardiovascular: RRR, heart sounds normal, no murmur or gallops, no peripheral edema  Musculoskeletal: No deformities, no cyanosis or clubbing  Neuro: alert, non focal  Skin: Warm, no lesions or rashes    CXR 08/30/17 --  COMPARISON:  09/10/2014  FINDINGS: There is no focal parenchymal opacity. There is no pleural effusion or pneumothorax. The heart and mediastinal contours are unremarkable. There is evidence of prior CABG.  The osseous structures are unremarkable.  IMPRESSION: No active cardiopulmonary disease.      Assessment & Plan:  COPD (chronic obstructive pulmonary disease) ([place]) Pulmonary function testing confirms severe obstruction with a positive bronchodilator response.  He is unsure whether the Stiolto helped him but he is also unsure whether he took it correctly.  He cannot really pass judgment on it.  He is willing to try an alternative to see if he gets better delivery and arise any benefit.  We will start Anoro. He will call us to let us know after 1 month if he has benefited and if so we will order through his pharmacy.  Baltazar Apo, MD, PhD 10/24/2017, 4:41 PM [facility] [phone] or if no answer [phone]

## 2017-10-24 NOTE — Assessment & Plan Note (Addendum)
Pulmonary function testing confirms severe obstruction with a positive bronchodilator response.  He is unsure whether the Stiolto helped him but he is also unsure whether he took it correctly.  He cannot really pass judgment on it.  He is willing to try an alternative to see if he gets better delivery and arise any benefit.  We will start Anoro. He will call us to let us know after 1 month if he has benefited and if so we will order through his pharmacy.

## 2017-10-24 NOTE — Progress Notes (Signed)
PFT done today. 

## 2017-12-04 ENCOUNTER — Other Ambulatory Visit: Payer: Self-pay | Admitting: Interventional Cardiology

## 2017-12-04 DIAGNOSIS — N184 Chronic kidney disease, stage 4 (severe): Secondary | ICD-10-CM | POA: Diagnosis not present

## 2017-12-04 DIAGNOSIS — I6350 Cerebral infarction due to unspecified occlusion or stenosis of unspecified cerebral artery: Secondary | ICD-10-CM | POA: Diagnosis not present

## 2017-12-04 DIAGNOSIS — R809 Proteinuria, unspecified: Secondary | ICD-10-CM | POA: Diagnosis not present

## 2017-12-04 DIAGNOSIS — E669 Obesity, unspecified: Secondary | ICD-10-CM | POA: Diagnosis not present

## 2017-12-04 DIAGNOSIS — Z9119 Patient's noncompliance with other medical treatment and regimen: Secondary | ICD-10-CM | POA: Diagnosis not present

## 2017-12-04 DIAGNOSIS — N2581 Secondary hyperparathyroidism of renal origin: Secondary | ICD-10-CM | POA: Diagnosis not present

## 2017-12-04 DIAGNOSIS — E8779 Other fluid overload: Secondary | ICD-10-CM | POA: Diagnosis not present

## 2017-12-04 DIAGNOSIS — N183 Chronic kidney disease, stage 3 (moderate): Secondary | ICD-10-CM | POA: Diagnosis not present

## 2017-12-04 DIAGNOSIS — I129 Hypertensive chronic kidney disease with stage 1 through stage 4 chronic kidney disease, or unspecified chronic kidney disease: Secondary | ICD-10-CM | POA: Diagnosis not present

## 2017-12-04 DIAGNOSIS — E1122 Type 2 diabetes mellitus with diabetic chronic kidney disease: Secondary | ICD-10-CM | POA: Diagnosis not present

## 2017-12-04 DIAGNOSIS — I25119 Atherosclerotic heart disease of native coronary artery with unspecified angina pectoris: Secondary | ICD-10-CM | POA: Diagnosis not present

## 2017-12-04 DIAGNOSIS — E1129 Type 2 diabetes mellitus with other diabetic kidney complication: Secondary | ICD-10-CM | POA: Diagnosis not present

## 2017-12-04 DIAGNOSIS — D631 Anemia in chronic kidney disease: Secondary | ICD-10-CM | POA: Diagnosis not present

## 2017-12-04 DIAGNOSIS — E782 Mixed hyperlipidemia: Secondary | ICD-10-CM | POA: Diagnosis not present

## 2017-12-05 ENCOUNTER — Other Ambulatory Visit: Payer: Self-pay

## 2017-12-05 ENCOUNTER — Ambulatory Visit (HOSPITAL_COMMUNITY): Payer: Medicare Other | Attending: Cardiology

## 2017-12-05 DIAGNOSIS — R0902 Hypoxemia: Secondary | ICD-10-CM

## 2017-12-05 DIAGNOSIS — E1122 Type 2 diabetes mellitus with diabetic chronic kidney disease: Secondary | ICD-10-CM | POA: Insufficient documentation

## 2017-12-05 DIAGNOSIS — I2510 Atherosclerotic heart disease of native coronary artery without angina pectoris: Secondary | ICD-10-CM | POA: Diagnosis not present

## 2017-12-05 DIAGNOSIS — Z6834 Body mass index (BMI) 34.0-34.9, adult: Secondary | ICD-10-CM | POA: Diagnosis not present

## 2017-12-05 DIAGNOSIS — I252 Old myocardial infarction: Secondary | ICD-10-CM | POA: Diagnosis not present

## 2017-12-05 DIAGNOSIS — I5032 Chronic diastolic (congestive) heart failure: Secondary | ICD-10-CM | POA: Diagnosis not present

## 2017-12-05 DIAGNOSIS — N182 Chronic kidney disease, stage 2 (mild): Secondary | ICD-10-CM | POA: Diagnosis not present

## 2017-12-05 DIAGNOSIS — I10 Essential (primary) hypertension: Secondary | ICD-10-CM | POA: Diagnosis not present

## 2017-12-05 DIAGNOSIS — E785 Hyperlipidemia, unspecified: Secondary | ICD-10-CM | POA: Insufficient documentation

## 2017-12-05 DIAGNOSIS — Z87891 Personal history of nicotine dependence: Secondary | ICD-10-CM | POA: Diagnosis not present

## 2017-12-05 DIAGNOSIS — E669 Obesity, unspecified: Secondary | ICD-10-CM | POA: Diagnosis not present

## 2017-12-05 DIAGNOSIS — R0602 Shortness of breath: Secondary | ICD-10-CM

## 2017-12-05 DIAGNOSIS — Z951 Presence of aortocoronary bypass graft: Secondary | ICD-10-CM | POA: Insufficient documentation

## 2017-12-05 DIAGNOSIS — I130 Hypertensive heart and chronic kidney disease with heart failure and stage 1 through stage 4 chronic kidney disease, or unspecified chronic kidney disease: Secondary | ICD-10-CM | POA: Diagnosis not present

## 2017-12-21 ENCOUNTER — Other Ambulatory Visit: Payer: Self-pay | Admitting: Interventional Cardiology

## 2017-12-24 ENCOUNTER — Encounter: Payer: Self-pay | Admitting: Physician Assistant

## 2017-12-24 NOTE — Progress Notes (Signed)
Cardiology Office Note    Date:  12/26/2017  ID:  Luke [name], DOB 1933/02/24, MRN [id number] PCP:  Aretta Nip, MD  Cardiologist:  Larae Grooms, MD   Chief Complaint: f/u dyspnea  History of Present Illness:  Luke [name] is a 84 y.o. male with history of CAD (with MI/CABG in 9/99 at [facility], [place] with [place] to LAD, LIMA to OM, SVG to PDA; LAD drug eluting stent in 3/09 through the graft to the distal LAD), DM, stroke, chronic diastolic CHF, probable CKD stage III, HTN, HLD, hiatal hernia, COPD with hypoxia who presents for f/u of SOB.  To recap last cardiac studies, last echo 2011 did not report out LVEF. Last stress test 2013 showed mild ischemia in apical lateral region, EF 70%, with recommendation consider cath for symptoms. Dr. Irish Lack has discussed cath with patient in the past for recurrent CP when he "overexerts" himself but noted he would like to avoid if possible due to some renal dysfunction; the patient also did not want to proceed previously. Most recent history includes ER visit 08/2017 for SOB and dizziness. He had run out of a few medicines but was not clear which ones. O2 sat was 87%. Triage labs were stable and troponin was negative, CBC wnl. ER EKG limited by baseline wander, TWI I, avL, nonspecific ST abnormality V5-V6 similar to prior. CXR NAD. He states he sat in the ER for several hours and left without being seen. Last labs otherwise in 2018 showed LDL 75, AST/ALT OK.  At f/u visit 08/2017, Mr. Lenhardt was feeling better. Pulse ox was 91% which was unchanged from prior visits with Dr. Irish Lack in 2018. F/u labs were stable so Imdur was increased to 60mg  daily and pulmonology follow-up was recommended. BMET showed K 4.9, Cr 1.43, normal pBNP. He has since seen Dr. Lamonte Sakai who has titrated his regimen for COPD. Pulmonary function testing confirmed severe obstruction with a positive bronchodilator response. He was started on Stiolto but at f/u with  pulm was unsure if this had either helped or if he was even taking correctly. He was willing to try Anoro instead. Dr. Agustina Caroli notes indicated he felt the patient would likely qualify for home O2 but the patient wished to avoid this.  He returns for follow-up overall feeling stable. He has started taking the Anoro on a PRN basis because he found it to be pricey. His chronic dyspnea is unchanged. He continues to report mild chest discomfort with higher levels of exertion beyond ADLs as previously noted but states it is completely unchanged from prior. In general he does not like to "rock the boat" with further testing/meds or overdue it given his age.   Past Medical History:  Diagnosis Date  . Anginal pain ([place]) 02/08/12  . Arthritis    "all over"  . CKD (chronic kidney disease), stage III ([place])   . COPD (chronic obstructive pulmonary disease) ([place])   . Coronary artery disease    a. MI/CABG in 9/99 at [facility], [place] with (RIMA to LAD, LIMA to OM, SVG to PDA. b. LAD drug eluting stent in 3/09 through the graft to the distal LAD.  Marland Kitchen Exertional dyspnea    "doesn't take much these days"  . H/O hiatal hernia   . Hyperlipemia   . Hypertension   . Hypothyroidism   . Hypoxia   . Stroke [facility]) 09/2010   denies residual  . Type II diabetes mellitus ([place])     Past  Surgical History:  Procedure Laterality Date  . CATARACT EXTRACTION W/ INTRAOCULAR LENS  IMPLANT, BILATERAL  ~ 2007  . COLONOSCOPY W/ POLYPECTOMY  09/2011   "removed 7"  . CORONARY ANGIOPLASTY WITH STENT PLACEMENT  ~ 2004   "1"  . CORONARY ARTERY BYPASS GRAFT  1999   CABG X4  . EYE SURGERY    . RETINAL DETACHMENT SURGERY  10/2011   right eye    Current Medications: Current Meds  Medication Sig  . amLODipine (NORVASC) 5 MG tablet Take 5 mg by mouth daily.  Marland Kitchen aspirin EC 81 MG tablet Take 1 tablet (81 mg total) by mouth daily.  Marland Kitchen atenolol (TENORMIN) 25 MG tablet Take 25 mg by mouth daily.  Marland Kitchen atorvastatin  (LIPITOR) 20 MG tablet Take 1 tablet (20 mg total) by mouth daily.  . furosemide (LASIX) 80 MG tablet Take 160 mg by mouth daily. 1 to 2 tabs daily  . insulin aspart (NOVOLOG) 100 UNIT/ML injection Inject 14-15 Units into the skin 3 (three) times daily with meals.   . insulin detemir (LEVEMIR) 100 UNIT/ML injection Inject 20-22 Units into the skin 2 (two) times daily.   Marland Kitchen levothyroxine (SYNTHROID, LEVOTHROID) 75 MCG tablet Take 75 mcg by mouth daily.  . nitroGLYCERIN (NITROSTAT) 0.4 MG SL tablet DISSOLVE 1 TABLET UNDER THE TONGUE EVERY 5 MINUTES AS NEEDED FOR CHEST PAIN  . polyvinyl alcohol (LIQUIFILM TEARS) 1.4 % ophthalmic solution Place 1 drop into both eyes as needed (For dry eyes).  Marland Kitchen umeclidinium bromide (INCRUSE ELLIPTA) 62.5 MCG/INH AEPB Inhale 1 puff into the lungs daily as needed. INHALE 1 PUFF INTO LUNGS DAILY AS NEEDED     Allergies:   Patient has no known allergies.   Social History   Socioeconomic History  . Marital status: Married    Spouse name: Not on file  . Number of children: Not on file  . Years of education: Not on file  . Highest education level: Not on file  Occupational History  . Not on file  Social Needs  . Financial resource strain: Not on file  . Food insecurity:    Worry: Not on file    Inability: Not on file  . Transportation needs:    Medical: Not on file    Non-medical: Not on file  Tobacco Use  . Smoking status: Former Smoker    Packs/day: 2.00    Years: 35.00    Pack years: 70.00    Types: Cigarettes    Last attempt to quit: 07/25/1993    Years since quitting: 24.4  . Smokeless tobacco: Former Systems developer    Quit date: 07/18/1995  Substance and Sexual Activity  . Alcohol use: No    Comment: 02/08/12 "stopped ~ 1995"  . Drug use: No  . Sexual activity: Never  Lifestyle  . Physical activity:    Days per week: Not on file    Minutes per session: Not on file  . Stress: Not on file  Relationships  . Social connections:    Talks on phone: Not on  file    Gets together: Not on file    Attends religious service: Not on file    Active member of club or organization: Not on file    Attends meetings of clubs or organizations: Not on file    Relationship status: Not on file  Other Topics Concern  . Not on file  Social History Narrative  . Not on file     Family History:  The  patient's family history includes Heart attack in his mother.  ROS:   Please see the history of present illness. + occasional wheezing. All other systems are reviewed and otherwise negative.    PHYSICAL EXAM:   VS:  BP (!) 120/56   Pulse 63   Ht 5\' 10"  (1.778 m)   Wt 248 lb 12.8 oz (112.9 kg)   SpO2 92%   BMI 35.70 kg/m   BMI: Body mass index is 35.7 kg/m. GEN: Well nourished, well developed obese WM, in no acute distress HEENT: normocephalic, atraumatic Neck: no JVD, carotid bruits, or masses Cardiac: RRR; no murmurs, rubs, or gallops, no edema  Respiratory:  Diminished BS throughout with rare upper lung field wheezing. Normal work of breathing GI: soft, nontender, nondistended, + BS MS: no deformity or atrophy Skin: warm and dry, no rash Neuro:  Alert and Oriented x 3, Strength and sensation are intact, follows commands Psych: euthymic mood, full affect  Wt Readings from Last 3 Encounters:  12/26/17 248 lb 12.8 oz (112.9 kg)  10/24/17 240 lb (108.9 kg)  09/18/17 244 lb (110.7 kg)      Studies/Labs Reviewed:   EKG:  EKG was ordered today and personally reviewed by me and demonstrates NSR 63bpm 1st degree AV block, TWI avL, otherwise nonacute.  Recent Labs: 08/30/2017: Hemoglobin 15.5; Platelets 202 09/14/2017: BUN 29; Creatinine, Ser 1.43; NT-Pro BNP 443; Potassium 4.9; Sodium 142   Lipid Panel No results found for: CHOL, TRIG, HDL, CHOLHDL, VLDL, LDLCALC, LDLDIRECT  Additional studies/ records that were reviewed today include: Summarized above    ASSESSMENT & PLAN:   1. Dyspea on exertion - suspect due primarily to COPD. We  discussed importance of compliance with Anoro and following up with pulmonology as planned. He was asked to discuss alternatives to Anoro with their office in the event he does not want to continue due to cost. We discussed the impact that untreated COPD would have on his cardiac history. Overall he feels relatively stable. We discussed changing atenolol to bisoprolol but he does not want to make any changes to his current regimen. 2. CAD - prior notes outline what sounds to be chronic stable angina which is unchanged for many years. He reports symptoms only when he "overexerts" himself. As with prior encounters, patient prefers continued medical therapy. As above, offered to change beta blocker but patient declined. Warning sx discussed. He will notify us of any worsening symptoms. Continue regimen otherwise. 3. HTN - controlled. 4. CKD III - this is followed by Dr. Jimmy Footman.   Disposition: F/u with Dr. Irish Lack in 6 months, sooner if needed.   Medication Adjustments/Labs and Tests Ordered: Current medicines are reviewed at length with the patient today.  Concerns regarding medicines are outlined above. Medication changes, Labs and Tests ordered today are summarized above and listed in the Patient Instructions accessible in Encounters.   Signed, Charlie Pitter, PA-C  12/26/2017 11:05 AM    [address], [place], [place]  [address] Phone: [phone]; Fax: [phone]

## 2017-12-25 DIAGNOSIS — E1165 Type 2 diabetes mellitus with hyperglycemia: Secondary | ICD-10-CM | POA: Diagnosis not present

## 2017-12-25 DIAGNOSIS — E039 Hypothyroidism, unspecified: Secondary | ICD-10-CM | POA: Diagnosis not present

## 2017-12-25 DIAGNOSIS — E782 Mixed hyperlipidemia: Secondary | ICD-10-CM | POA: Diagnosis not present

## 2017-12-25 DIAGNOSIS — Z794 Long term (current) use of insulin: Secondary | ICD-10-CM | POA: Diagnosis not present

## 2017-12-26 ENCOUNTER — Encounter: Payer: Self-pay | Admitting: Physician Assistant

## 2017-12-26 ENCOUNTER — Ambulatory Visit (INDEPENDENT_AMBULATORY_CARE_PROVIDER_SITE_OTHER): Payer: Medicare Other | Admitting: Physician Assistant

## 2017-12-26 VITALS — BP 120/56 | HR 63 | Ht 70.0 in | Wt 248.8 lb

## 2017-12-26 DIAGNOSIS — N183 Chronic kidney disease, stage 3 unspecified: Secondary | ICD-10-CM

## 2017-12-26 DIAGNOSIS — I10 Essential (primary) hypertension: Secondary | ICD-10-CM

## 2017-12-26 DIAGNOSIS — R0609 Other forms of dyspnea: Secondary | ICD-10-CM

## 2017-12-26 DIAGNOSIS — I2510 Atherosclerotic heart disease of native coronary artery without angina pectoris: Secondary | ICD-10-CM

## 2017-12-26 NOTE — Patient Instructions (Signed)
Medication Instructions:  Your physician recommends that you continue on your current medications as directed. Please refer to the Current Medication list given to you today.   Labwork: None Ordered   Testing/Procedures: None Ordered   Follow-Up: Your physician wants you to follow-up in: 6 months with Dr. Irish Lack. You will receive a reminder letter in the mail two months in advance. If you don't receive a letter, please call our office to schedule the follow-up appointment.   If you need a refill on your cardiac medications before your next appointment, please call your pharmacy.   Thank you for choosing [facility]! Christen Bame, RN [phone]

## 2017-12-28 DIAGNOSIS — Z6837 Body mass index (BMI) 37.0-37.9, adult: Secondary | ICD-10-CM | POA: Diagnosis not present

## 2017-12-28 DIAGNOSIS — E1121 Type 2 diabetes mellitus with diabetic nephropathy: Secondary | ICD-10-CM | POA: Diagnosis not present

## 2017-12-28 DIAGNOSIS — E782 Mixed hyperlipidemia: Secondary | ICD-10-CM | POA: Diagnosis not present

## 2017-12-28 DIAGNOSIS — Z7982 Long term (current) use of aspirin: Secondary | ICD-10-CM | POA: Diagnosis not present

## 2017-12-28 DIAGNOSIS — E1165 Type 2 diabetes mellitus with hyperglycemia: Secondary | ICD-10-CM | POA: Diagnosis not present

## 2017-12-28 DIAGNOSIS — N183 Chronic kidney disease, stage 3 (moderate): Secondary | ICD-10-CM | POA: Diagnosis not present

## 2017-12-28 DIAGNOSIS — Z794 Long term (current) use of insulin: Secondary | ICD-10-CM | POA: Diagnosis not present

## 2017-12-28 DIAGNOSIS — E1122 Type 2 diabetes mellitus with diabetic chronic kidney disease: Secondary | ICD-10-CM | POA: Diagnosis not present

## 2017-12-28 DIAGNOSIS — E039 Hypothyroidism, unspecified: Secondary | ICD-10-CM | POA: Diagnosis not present

## 2017-12-28 DIAGNOSIS — I129 Hypertensive chronic kidney disease with stage 1 through stage 4 chronic kidney disease, or unspecified chronic kidney disease: Secondary | ICD-10-CM | POA: Diagnosis not present

## 2017-12-28 DIAGNOSIS — E669 Obesity, unspecified: Secondary | ICD-10-CM | POA: Diagnosis not present

## 2018-02-27 ENCOUNTER — Ambulatory Visit (INDEPENDENT_AMBULATORY_CARE_PROVIDER_SITE_OTHER): Payer: Medicare Other | Admitting: Emergency Medicine

## 2018-02-27 ENCOUNTER — Encounter: Payer: Self-pay | Admitting: Emergency Medicine

## 2018-02-27 DIAGNOSIS — I2510 Atherosclerotic heart disease of native coronary artery without angina pectoris: Secondary | ICD-10-CM

## 2018-02-27 DIAGNOSIS — J449 Chronic obstructive pulmonary disease, unspecified: Secondary | ICD-10-CM | POA: Diagnosis not present

## 2018-02-27 MED ORDER — ALBUTEROL SULFATE HFA 108 (90 BASE) MCG/ACT IN AERS: 2.0000 | INHALATION_SPRAY | RESPIRATORY_TRACT | 5 refills | Status: DC | PRN

## 2018-02-27 NOTE — Progress Notes (Signed)
Subjective:    Patient ID: Luke [name], male    DOB: [birth year], 84 y.o.   MRN: [id number]  HPI 84 year old former smoker (53 pack years) with a history of coronary disease and CABG, diabetes, CVA, hypertension with diastolic CHF, chronic kidney disease and a hiatal hernia.  Followed by cardiology, appears to have some exertional angina that responds to nitroglycerin.  He has been under evaluation for exertional dyspnea and found to have hypoxemia. He reports that he began to have exercise limitation over a year ago. He does not cough frequently. Has some sinus gtt. Has heard wheeze before.   He was 82% after ambulating to the exam room today on room air.   ROV 10/24/17 --follow-up visit for patient with a history of coronary disease, CVA, hypertension with diastolic dysfunction, tobacco use and presumed COPD.  He underwent pulmonary function testing today that shows severe obstruction (FEV1 49% predicted) with a positive bronchodilator response.  His lung volumes were not done, his diffusion capacity was decreased and did not fully correct when adjusted for his alveolar volume.  At our last visit I started him on Stiolto to see if he would benefit. He is unsure whether it helped him but he is also unsure whether he took it correctly.   ROV 02/27/18 --this is a follow-up visit for 84 year old gentleman with a history of tobacco use, coronary disease/CABG, diabetes, CVA, hypertension with diastolic CHF, chronic kidney disease.  We have documented severe obstructive lung disease, COPD.  We tried Stiolto but was not clear to me that he benefited or took long enough.  We changed over to Anoro at our last visit.  Today he reports that didn't take the Anoro correctly - may have taken it for 4-5 days. He doesn't have a SABA. He has hx of documented exertional hypoxemia, but has not wanted to consider home O2. He still has some exertional SOB.    Review of Systems  Constitutional: Negative for fever and  unexpected weight change.  HENT: Negative for congestion, dental problem, ear pain, nosebleeds, postnasal drip, rhinorrhea, sinus pressure, sneezing, sore throat and trouble swallowing.   Eyes: Negative for redness and itching.  Respiratory: Positive for cough, chest tightness, shortness of breath and wheezing.   Cardiovascular: Negative for palpitations and leg swelling.  Gastrointestinal: Negative for nausea and vomiting.  Genitourinary: Negative for dysuria.  Musculoskeletal: Negative for joint swelling.  Skin: Negative for rash.  Neurological: Negative for headaches.  Hematological: Does not bruise/bleed easily.  Psychiatric/Behavioral: Negative for dysphoric mood. The patient is not nervous/anxious.     Past Medical History:  Diagnosis Date  . Anginal pain ([place]) 02/08/12  . Arthritis    "all over"  . CKD (chronic kidney disease), stage III ([place])   . COPD (chronic obstructive pulmonary disease) ([place])   . Coronary artery disease    a. MI/CABG in 9/99 at [facility], [place] with (RIMA to LAD, LIMA to OM, SVG to PDA. b. LAD drug eluting stent in 3/09 through the graft to the distal LAD.  Marland Kitchen Exertional dyspnea    "doesn't take much these days"  . H/O hiatal hernia   . Hyperlipemia   . Hypertension   . Hypothyroidism   . Hypoxia   . Stroke [facility]) 09/2010   denies residual  . Type II diabetes mellitus ([place])      Family History  Problem Relation Age of Onset  . Heart attack Mother      Social History   Socioeconomic  History  . Marital status: Married    Spouse name: Not on file  . Number of children: Not on file  . Years of education: Not on file  . Highest education level: Not on file  Occupational History  . Not on file  Social Needs  . Financial resource strain: Not on file  . Food insecurity:    Worry: Not on file    Inability: Not on file  . Transportation needs:    Medical: Not on file    Non-medical: Not on file  Tobacco Use  . Smoking status:  Former Smoker    Packs/day: 2.00    Years: 35.00    Pack years: 70.00    Types: Cigarettes    Last attempt to quit: 07/25/1993    Years since quitting: 24.6  . Smokeless tobacco: Former Systems developer    Quit date: 07/18/1995  Substance and Sexual Activity  . Alcohol use: No    Comment: 02/08/12 "stopped ~ 1995"  . Drug use: No  . Sexual activity: Never  Lifestyle  . Physical activity:    Days per week: Not on file    Minutes per session: Not on file  . Stress: Not on file  Relationships  . Social connections:    Talks on phone: Not on file    Gets together: Not on file    Attends religious service: Not on file    Active member of club or organization: Not on file    Attends meetings of clubs or organizations: Not on file    Relationship status: Not on file  . Intimate partner violence:    Fear of current or ex partner: Not on file    Emotionally abused: Not on file    Physically abused: Not on file    Forced sexual activity: Not on file  Other Topics Concern  . Not on file  Social History Narrative  . 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  He was in the [employer], [employer], went to [place], [place]. Has lived in [place] as well.   No Known Allergies   Outpatient Medications Prior to Visit  Medication Sig Dispense Refill  . amLODipine (NORVASC) 5 MG tablet Take 5 mg by mouth daily.  1  . aspirin EC 81 MG tablet Take 1 tablet (81 mg total) by mouth daily.    Marland Kitchen atenolol (TENORMIN) 25 MG tablet Take 25 mg by mouth daily.    Marland Kitchen atorvastatin (LIPITOR) 20 MG tablet Take 1 tablet (20 mg total) by mouth daily. 90 tablet 3  . furosemide (LASIX) 80 MG tablet Take 160 mg by mouth daily. 1 to 2 tabs daily    . insulin aspart (NOVOLOG) 100 UNIT/ML injection Inject 14-15 Units into the skin 3 (three) times daily with meals.     . insulin detemir (LEVEMIR) 100 UNIT/ML injection Inject 20-22 Units into the skin 2 (two) times daily.     Marland Kitchen levothyroxine  (SYNTHROID, LEVOTHROID) 75 MCG tablet Take 75 mcg by mouth daily.    . nitroGLYCERIN (NITROSTAT) 0.4 MG SL tablet DISSOLVE 1 TABLET UNDER THE TONGUE EVERY 5 MINUTES AS NEEDED FOR CHEST PAIN 25 tablet 5  . polyvinyl alcohol (LIQUIFILM TEARS) 1.4 % ophthalmic solution Place 1 drop into both eyes as needed (For dry eyes).    Marland Kitchen umeclidinium-vilanterol (ANORO ELLIPTA) 62.5-25 MCG/INH AEPB Inhale 1 puff into the lungs daily.    . isosorbide mononitrate (IMDUR) 60 MG 24 hr  tablet Take 1 tablet (60 mg total) by mouth daily. 90 tablet 3  . umeclidinium bromide (INCRUSE ELLIPTA) 62.5 MCG/INH AEPB Inhale 1 puff into the lungs daily as needed. INHALE 1 PUFF INTO LUNGS DAILY AS NEEDED     No facility-administered medications prior to visit.         Objective:   Physical Exam Vitals:   02/27/18 0958  BP: 134/68  Pulse: 72  SpO2: 90%  Weight: 247 lb (112 kg)  Height: 5\' 10"  (1.778 m)   Gen: Pleasant, well-nourished, in no distress,  normal affect  ENT: No lesions,  mouth clear,  oropharynx clear, no postnasal drip  Neck: No JVD, no stridor  Lungs: No use of accessory muscles, very distant, clear without rales or rhonchi  Cardiovascular: RRR, heart sounds normal, no murmur or gallops, no peripheral edema  Musculoskeletal: No deformities, no cyanosis or clubbing  Neuro: alert, non focal  Skin: Warm, no lesions or rashes    CXR 08/30/17 --  COMPARISON:  09/10/2014  FINDINGS: There is no focal parenchymal opacity. There is no pleural effusion or pneumothorax. The heart and mediastinal contours are unremarkable. There is evidence of prior CABG.  The osseous structures are unremarkable.  IMPRESSION: No active cardiopulmonary disease.      Assessment & Plan:  COPD (chronic obstructive pulmonary disease) ([place]) Documented COPD although his symptoms appear to be minimal.  He does sometimes get exertional shortness of breath.  He has documented hypoxemia in the past but has not been  interested in submental oxygen.  I tried him on both Stiolto and Anoro, unclear that he ever took these reliably or correctly.  I explained to him the concept of albuterol to be used as needed for exertional shortness of breath.  We will try this strategy.  See if he benefits.  Please stop Anoro at this time. We will try using albuterol 2 puffs if needed for shortness of breath, wheezing, chest tightness.  Keep this medication available and use it if you have symptoms.  You do not have to take it every day. Continue other medications as ordered. We will not start oxygen at this time. Follow with Dr Lamonte Sakai in 6 months or sooner if you have any problems  Baltazar Apo, MD, PhD 02/27/2018, 10:29 AM  Pulmonary and Critical Care [phone] or if no answer [phone]

## 2018-02-27 NOTE — Patient Instructions (Signed)
Please stop Anoro at this time. We will try using albuterol 2 puffs if needed for shortness of breath, wheezing, chest tightness.  Keep this medication available and use it if you have symptoms.  You do not have to take it every day. Continue other medications as ordered. We will not start oxygen at this time. Follow with Dr Lamonte Sakai in 6 months or sooner if you have any problems

## 2018-02-27 NOTE — Addendum Note (Signed)
Addended by: Desmond Dike C on: 02/27/2018 11:45 AM   Modules accepted: Orders

## 2018-02-27 NOTE — Assessment & Plan Note (Signed)
Documented COPD although his symptoms appear to be minimal.  He does sometimes get exertional shortness of breath.  He has documented hypoxemia in the past but has not been interested in submental oxygen.  I tried him on both Stiolto and Anoro, unclear that he ever took these reliably or correctly.  I explained to him the concept of albuterol to be used as needed for exertional shortness of breath.  We will try this strategy.  See if he benefits.  Please stop Anoro at this time. We will try using albuterol 2 puffs if needed for shortness of breath, wheezing, chest tightness.  Keep this medication available and use it if you have symptoms.  You do not have to take it every day. Continue other medications as ordered. We will not start oxygen at this time. Follow with Dr Lamonte Sakai in 6 months or sooner if you have any problems

## 2018-03-21 DIAGNOSIS — Z23 Encounter for immunization: Secondary | ICD-10-CM | POA: Diagnosis not present

## 2018-03-27 DIAGNOSIS — E782 Mixed hyperlipidemia: Secondary | ICD-10-CM | POA: Diagnosis not present

## 2018-03-27 DIAGNOSIS — E1165 Type 2 diabetes mellitus with hyperglycemia: Secondary | ICD-10-CM | POA: Diagnosis not present

## 2018-03-27 DIAGNOSIS — E039 Hypothyroidism, unspecified: Secondary | ICD-10-CM | POA: Diagnosis not present

## 2018-03-27 DIAGNOSIS — Z794 Long term (current) use of insulin: Secondary | ICD-10-CM | POA: Diagnosis not present

## 2018-04-02 DIAGNOSIS — E782 Mixed hyperlipidemia: Secondary | ICD-10-CM | POA: Diagnosis not present

## 2018-04-02 DIAGNOSIS — E1165 Type 2 diabetes mellitus with hyperglycemia: Secondary | ICD-10-CM | POA: Diagnosis not present

## 2018-04-02 DIAGNOSIS — Z794 Long term (current) use of insulin: Secondary | ICD-10-CM | POA: Diagnosis not present

## 2018-04-02 DIAGNOSIS — N183 Chronic kidney disease, stage 3 (moderate): Secondary | ICD-10-CM | POA: Diagnosis not present

## 2018-04-02 DIAGNOSIS — E1122 Type 2 diabetes mellitus with diabetic chronic kidney disease: Secondary | ICD-10-CM | POA: Diagnosis not present

## 2018-04-02 DIAGNOSIS — E039 Hypothyroidism, unspecified: Secondary | ICD-10-CM | POA: Diagnosis not present

## 2018-04-02 DIAGNOSIS — I129 Hypertensive chronic kidney disease with stage 1 through stage 4 chronic kidney disease, or unspecified chronic kidney disease: Secondary | ICD-10-CM | POA: Diagnosis not present

## 2018-04-03 DIAGNOSIS — E1122 Type 2 diabetes mellitus with diabetic chronic kidney disease: Secondary | ICD-10-CM | POA: Diagnosis not present

## 2018-04-03 DIAGNOSIS — N2581 Secondary hyperparathyroidism of renal origin: Secondary | ICD-10-CM | POA: Diagnosis not present

## 2018-04-03 DIAGNOSIS — Z0000 Encounter for general adult medical examination without abnormal findings: Secondary | ICD-10-CM | POA: Diagnosis not present

## 2018-04-03 DIAGNOSIS — R809 Proteinuria, unspecified: Secondary | ICD-10-CM | POA: Diagnosis not present

## 2018-04-03 DIAGNOSIS — I6350 Cerebral infarction due to unspecified occlusion or stenosis of unspecified cerebral artery: Secondary | ICD-10-CM | POA: Diagnosis not present

## 2018-04-03 DIAGNOSIS — J449 Chronic obstructive pulmonary disease, unspecified: Secondary | ICD-10-CM | POA: Diagnosis not present

## 2018-04-03 DIAGNOSIS — Z9119 Patient's noncompliance with other medical treatment and regimen: Secondary | ICD-10-CM | POA: Diagnosis not present

## 2018-04-03 DIAGNOSIS — I25119 Atherosclerotic heart disease of native coronary artery with unspecified angina pectoris: Secondary | ICD-10-CM | POA: Diagnosis not present

## 2018-04-03 DIAGNOSIS — E782 Mixed hyperlipidemia: Secondary | ICD-10-CM | POA: Diagnosis not present

## 2018-04-03 DIAGNOSIS — D631 Anemia in chronic kidney disease: Secondary | ICD-10-CM | POA: Diagnosis not present

## 2018-04-03 DIAGNOSIS — N184 Chronic kidney disease, stage 4 (severe): Secondary | ICD-10-CM | POA: Diagnosis not present

## 2018-04-03 DIAGNOSIS — I129 Hypertensive chronic kidney disease with stage 1 through stage 4 chronic kidney disease, or unspecified chronic kidney disease: Secondary | ICD-10-CM | POA: Diagnosis not present

## 2018-06-27 DIAGNOSIS — E1121 Type 2 diabetes mellitus with diabetic nephropathy: Secondary | ICD-10-CM | POA: Diagnosis not present

## 2018-06-27 DIAGNOSIS — I10 Essential (primary) hypertension: Secondary | ICD-10-CM | POA: Diagnosis not present

## 2018-06-27 DIAGNOSIS — E039 Hypothyroidism, unspecified: Secondary | ICD-10-CM | POA: Diagnosis not present

## 2018-06-27 DIAGNOSIS — Z794 Long term (current) use of insulin: Secondary | ICD-10-CM | POA: Diagnosis not present

## 2018-07-02 DIAGNOSIS — Z794 Long term (current) use of insulin: Secondary | ICD-10-CM | POA: Diagnosis not present

## 2018-07-02 DIAGNOSIS — E039 Hypothyroidism, unspecified: Secondary | ICD-10-CM | POA: Diagnosis not present

## 2018-07-02 DIAGNOSIS — E1165 Type 2 diabetes mellitus with hyperglycemia: Secondary | ICD-10-CM | POA: Diagnosis not present

## 2018-07-02 DIAGNOSIS — E782 Mixed hyperlipidemia: Secondary | ICD-10-CM | POA: Diagnosis not present

## 2018-07-03 DIAGNOSIS — Z794 Long term (current) use of insulin: Secondary | ICD-10-CM | POA: Diagnosis not present

## 2018-07-03 DIAGNOSIS — E1122 Type 2 diabetes mellitus with diabetic chronic kidney disease: Secondary | ICD-10-CM | POA: Diagnosis not present

## 2018-07-03 DIAGNOSIS — E039 Hypothyroidism, unspecified: Secondary | ICD-10-CM | POA: Diagnosis not present

## 2018-07-03 DIAGNOSIS — E1165 Type 2 diabetes mellitus with hyperglycemia: Secondary | ICD-10-CM | POA: Diagnosis not present

## 2018-07-03 DIAGNOSIS — I129 Hypertensive chronic kidney disease with stage 1 through stage 4 chronic kidney disease, or unspecified chronic kidney disease: Secondary | ICD-10-CM | POA: Diagnosis not present

## 2018-07-03 DIAGNOSIS — E782 Mixed hyperlipidemia: Secondary | ICD-10-CM | POA: Diagnosis not present

## 2018-07-03 DIAGNOSIS — N183 Chronic kidney disease, stage 3 (moderate): Secondary | ICD-10-CM | POA: Diagnosis not present

## 2018-08-06 DIAGNOSIS — N184 Chronic kidney disease, stage 4 (severe): Secondary | ICD-10-CM | POA: Diagnosis not present

## 2018-08-06 DIAGNOSIS — N183 Chronic kidney disease, stage 3 (moderate): Secondary | ICD-10-CM | POA: Diagnosis not present

## 2018-08-06 DIAGNOSIS — I6350 Cerebral infarction due to unspecified occlusion or stenosis of unspecified cerebral artery: Secondary | ICD-10-CM | POA: Diagnosis not present

## 2018-08-06 DIAGNOSIS — R809 Proteinuria, unspecified: Secondary | ICD-10-CM | POA: Diagnosis not present

## 2018-08-06 DIAGNOSIS — I25119 Atherosclerotic heart disease of native coronary artery with unspecified angina pectoris: Secondary | ICD-10-CM | POA: Diagnosis not present

## 2018-08-06 DIAGNOSIS — E782 Mixed hyperlipidemia: Secondary | ICD-10-CM | POA: Diagnosis not present

## 2018-08-06 DIAGNOSIS — E039 Hypothyroidism, unspecified: Secondary | ICD-10-CM | POA: Diagnosis not present

## 2018-08-06 DIAGNOSIS — D631 Anemia in chronic kidney disease: Secondary | ICD-10-CM | POA: Diagnosis not present

## 2018-08-06 DIAGNOSIS — I129 Hypertensive chronic kidney disease with stage 1 through stage 4 chronic kidney disease, or unspecified chronic kidney disease: Secondary | ICD-10-CM | POA: Diagnosis not present

## 2018-08-06 DIAGNOSIS — E669 Obesity, unspecified: Secondary | ICD-10-CM | POA: Diagnosis not present

## 2018-08-06 DIAGNOSIS — J449 Chronic obstructive pulmonary disease, unspecified: Secondary | ICD-10-CM | POA: Diagnosis not present

## 2018-08-06 DIAGNOSIS — N2581 Secondary hyperparathyroidism of renal origin: Secondary | ICD-10-CM | POA: Diagnosis not present

## 2018-08-06 NOTE — Progress Notes (Signed)
Cardiology Office Note   Date:  08/07/2018   ID:  Luke [name], DOB 11-Jul-1933, MRN [id number]  PCP:  Aretta Nip, MD    No chief complaint on file.  CAD  Wt Readings from Last 3 Encounters:  08/07/18 249 lb (112.9 kg)  02/27/18 247 lb (112 kg)  12/26/17 248 lb 12.8 oz (112.9 kg)       History of Present Illness: Luke [name] is a 84 y.o. male  with history of CAD (with MI/CABG in 9/99 at [facility], [place] with Luke [name] to LAD, LIMA to OM, SVG to PDA; LAD drug eluting stent in 3/09 through the graft to the distal LAD), DM, stroke, chronic diastolic CHF, CKD stage III, HTN, HLD, hiatal hernia, COPD with hypoxia who presents for f/u of SOB.  To recap last cardiac studies, last echo 2011 did not report out LVEF. Last stress test 2013 showed mild ischemia in apical lateral region, EF 70%,   Recent history includes ER visit 08/2017 for SOB and dizziness. He had run out of a few medicines but was not clear which ones. O2 sat was 87%. Triage labs were stable and troponin was negative, CBC wnl. ER EKG limited by baseline wander, TWI I, avL, nonspecific ST abnormality V5-V6 similar to prior. CXR NAD. He states he sat in the ER for several hours and left without being seen. Last labs otherwise in 2018 showed LDL 75, AST/ALT OK.  At f/u visit 08/2017, [name] was feeling better. Pulse ox was 91% which was unchanged from prior visits with Dr. Irish Lack in 2018. F/u labs were stable so Imdur was increased to 60mg  daily and pulmonology follow-up was recommended. BMET showed K 4.9, Cr 1.43, normal pBNP. He has since seen Dr. Lamonte Sakai who has titrated his regimen for COPD. Pulmonary function testing confirmed severe obstruction with a positive bronchodilator response. He was started on Stiolto but at f/u with pulm was unsure if this had either helped or if he was even taking correctly. He was willing to try Anoro instead. Dr. Agustina Caroli notes indicated he felt the patient would  likely qualify for home O2 but the patient wished to avoid this.  He has had a few visits and medical therapy was chosen due to his comorbidities.    He continues to use his inhalers.  He notices an improvement in his breathing when he uses these.  Not walking regularly.  He states that he lacks willpower.  He uses a riding lawnmower.  He feels he is doing enough.    Denies : Chest pain. Dizziness. Leg edema. Orthopnea. Palpitations. Paroxysmal nocturnal dyspnea. Shortness of breath. Syncope.   Rare NTG usage.  Uses it when he feels some anxiety feeling, not really having chest pain.     Past Medical History:  Diagnosis Date  . Anginal pain ([place]) 02/08/12  . Arthritis    "all over"  . CKD (chronic kidney disease), stage III ([place])   . COPD (chronic obstructive pulmonary disease) ([place])   . Coronary artery disease    a. MI/CABG in 9/99 at [facility], [place] with (RIMA to LAD, LIMA to OM, SVG to PDA. b. LAD drug eluting stent in 3/09 through the graft to the distal LAD.  Marland Kitchen Exertional dyspnea    "doesn't take much these days"  . H/O hiatal hernia   . Hyperlipemia   . Hypertension   . Hypothyroidism   . Hypoxia   . Stroke [facility]) 09/2010   denies  residual  . Type II diabetes mellitus ([place])     Past Surgical History:  Procedure Laterality Date  . CATARACT EXTRACTION W/ INTRAOCULAR LENS  IMPLANT, BILATERAL  ~ 2007  . COLONOSCOPY W/ POLYPECTOMY  09/2011   "removed 7"  . CORONARY ANGIOPLASTY WITH STENT PLACEMENT  ~ 2004   "1"  . CORONARY ARTERY BYPASS GRAFT  1999   CABG X4  . EYE SURGERY    . RETINAL DETACHMENT SURGERY  10/2011   right eye     Current Outpatient Medications  Medication Sig Dispense Refill  . albuterol (PROVENTIL HFA;VENTOLIN HFA) 108 (90 Base) MCG/ACT inhaler Inhale 2 puffs into the lungs every 4 (four) hours as needed for wheezing or shortness of breath. 1 Inhaler 5  . amLODipine (NORVASC) 5 MG tablet Take 5 mg by mouth daily.  1  . aspirin EC 81  MG tablet Take 1 tablet (81 mg total) by mouth daily.    Marland Kitchen atenolol (TENORMIN) 25 MG tablet Take 25 mg by mouth daily.    Marland Kitchen atorvastatin (LIPITOR) 20 MG tablet Take 1 tablet (20 mg total) by mouth daily. 90 tablet 3  . furosemide (LASIX) 80 MG tablet Take 160 mg by mouth daily. 1 to 2 tabs daily    . insulin aspart (NOVOLOG) 100 UNIT/ML injection Inject 14-15 Units into the skin 3 (three) times daily with meals.     . insulin detemir (LEVEMIR) 100 UNIT/ML injection Inject 20-22 Units into the skin 2 (two) times daily.     . isosorbide mononitrate (IMDUR) 60 MG 24 hr tablet Take 1 tablet (60 mg total) by mouth daily. 90 tablet 3  . levothyroxine (SYNTHROID, LEVOTHROID) 75 MCG tablet Take 75 mcg by mouth daily.    . nitroGLYCERIN (NITROSTAT) 0.4 MG SL tablet DISSOLVE 1 TABLET UNDER THE TONGUE EVERY 5 MINUTES AS NEEDED FOR CHEST PAIN 25 tablet 5  . polyvinyl alcohol (LIQUIFILM TEARS) 1.4 % ophthalmic solution Place 1 drop into both eyes as needed (For dry eyes).    Marland Kitchen umeclidinium-vilanterol (ANORO ELLIPTA) 62.5-25 MCG/INH AEPB Inhale 1 puff into the lungs daily.     No current facility-administered medications for this visit.     Allergies:   Patient has no known allergies.    Social History:  The patient  reports that he quit smoking about 25 years ago. His smoking use included cigarettes. He has a 70.00 pack-year smoking history. He quit smokeless tobacco use about 23 years ago. He reports that he does not drink alcohol or use drugs.   Family History:  The patient's family history includes Heart attack in his mother.    ROS:  Please see the history of present illness.   Otherwise, review of systems are positive for DOE.   All other systems are reviewed and negative.    PHYSICAL EXAM: VS:  BP (!) 116/56   Pulse 68   Ht 5\' 10"  (1.778 m)   Wt 249 lb (112.9 kg)   SpO2 92%   BMI 35.73 kg/m  , BMI Body mass index is 35.73 kg/m. GEN: Well nourished, well developed, in no acute distress    HEENT: normal  Neck: no JVD, carotid bruits, or masses Cardiac: RRR; no murmurs, rubs, or gallops,;Mild LE edema  Respiratory:  clear to auscultation bilaterally, normal work of breathing GI: soft, nontender, nondistended, + BS MS: no deformity or atrophy  Skin: warm and dry, no rash Neuro:  Strength and sensation are intact Psych: euthymic mood, full affect  Recent Labs: 08/30/2017: Hemoglobin 15.5; Platelets 202 09/14/2017: BUN 29; Creatinine, Ser 1.43; NT-Pro BNP 443; Potassium 4.9; Sodium 142   Lipid Panel No results found for: CHOL, TRIG, HDL, CHOLHDL, VLDL, LDLCALC, LDLDIRECT   Other studies Reviewed: Additional studies/ records that were reviewed today with results demonstrating: labs reviewed.   ASSESSMENT AND PLAN:  1. CAD: Angina controlled with medicines.  Rare NTG use.  Continue secondary prevention.  If exercise tolerance drops, he will let us know and we will consider angio.  I don't think he is that active, but this is not likely reversible by a PCI.  2. DOE: Stable.  Uses inhalers.  COPD an issue as well.   3. HTN: The current medical regimen is effective;  continue present plan and medications. 4. CKD III: 1.4 in 4/19.  Avoid nephrotoxins. 5. Lipids were done in 12/19.  LDL 90 in 2019.e    Current medicines are reviewed at length with the patient today.  The patient concerns regarding his medicines were addressed.  The following changes have been made:  No change  Labs/ tests ordered today include:  No orders of the defined types were placed in this encounter.   Recommend 150 minutes/week of aerobic exercise Low fat, low carb, high fiber diet recommended  Disposition:   FU in 6 months   Signed, Larae Grooms, MD  08/07/2018 2:03 PM    [facility] [address], [place], [place]  [address] Phone: [phone]; Fax: [phone]

## 2018-08-07 ENCOUNTER — Ambulatory Visit (INDEPENDENT_AMBULATORY_CARE_PROVIDER_SITE_OTHER): Payer: Medicare Other | Admitting: Interventional Cardiology

## 2018-08-07 ENCOUNTER — Encounter: Payer: Self-pay | Admitting: Interventional Cardiology

## 2018-08-07 VITALS — BP 116/56 | HR 68 | Ht 70.0 in | Wt 249.0 lb

## 2018-08-07 DIAGNOSIS — R0609 Other forms of dyspnea: Secondary | ICD-10-CM | POA: Diagnosis not present

## 2018-08-07 DIAGNOSIS — I25118 Atherosclerotic heart disease of native coronary artery with other forms of angina pectoris: Secondary | ICD-10-CM

## 2018-08-07 DIAGNOSIS — N183 Chronic kidney disease, stage 3 unspecified: Secondary | ICD-10-CM

## 2018-08-07 DIAGNOSIS — I10 Essential (primary) hypertension: Secondary | ICD-10-CM

## 2018-08-07 NOTE — Patient Instructions (Signed)

## 2018-08-29 ENCOUNTER — Encounter: Payer: Self-pay | Admitting: Emergency Medicine

## 2018-08-29 ENCOUNTER — Ambulatory Visit (INDEPENDENT_AMBULATORY_CARE_PROVIDER_SITE_OTHER): Payer: Medicare Other | Admitting: Emergency Medicine

## 2018-08-29 DIAGNOSIS — I25118 Atherosclerotic heart disease of native coronary artery with other forms of angina pectoris: Secondary | ICD-10-CM

## 2018-08-29 DIAGNOSIS — R0902 Hypoxemia: Secondary | ICD-10-CM

## 2018-08-29 DIAGNOSIS — J449 Chronic obstructive pulmonary disease, unspecified: Secondary | ICD-10-CM | POA: Diagnosis not present

## 2018-08-29 NOTE — Patient Instructions (Signed)
We will not start oxygen at this time. Please keep your albuterol available to use 2 puffs if you need it for shortness of breath, chest tightness, wheezing. Flu shot and pneumonia shots are up-to-date. Follow with Dr. Lamonte Sakai in 12 months or sooner if you have any problems.

## 2018-08-29 NOTE — Assessment & Plan Note (Signed)
I discussed with him possibly going back on scheduled bronchodilator therapy.  He is not interested.  He was unable to convince me that he gets any true benefit from the albuterol but is a difficult history giver.  He does take it 1-2 times daily, says that it may "ease his mind".  Question placebo effect?  I suspect that he is getting something out of it.  Offered to restart Stiolto today but he wants to defer.  Flu shot, pneumonia shot up-to-date

## 2018-08-29 NOTE — Assessment & Plan Note (Addendum)
Exertional hypoxemia documented on previous exam.  He is not interested in oxygen, does not think he needs it.  Actually this is not entirely clear, but he does not want a repeat walking oximetry right now.  I can revisit with him next time or if his symptoms change.

## 2018-08-29 NOTE — Progress Notes (Signed)
Subjective:    Patient ID: Luke [name], male    DOB: 1933-04-25, 85 y.o.   MRN: [id number]  HPI  ROV 08/29/2018--Luke [name] is 28, history of former tobacco (70 pack years), coronary disease/CABG, diabetes, CVA, hypertension with diastolic dysfunction.  He has severe obstruction by pulmonary function testing and clinically.  He also has documented exertional hypoxemia but is never wanted supplemental oxygen when I offered it to him.  I have tried him on Stiolto and Anoro.  He never got clinical benefit but I was never convinced he was actually using it correctly.  We did not restart 6 months ago, instead we tried to use albuterol for symptom relief.  He reports today that he does have albuterol, uses it 1-2x a day. He is unsure whether the albuterol does anything - ? Placebo effect.  Again today he does not want O2 or to repeat his walking oximetry. He did get his flu shot this year, up to date on PNA shot.    Review of Systems  Constitutional: Negative for fever and unexpected weight change.  HENT: Negative for congestion, dental problem, ear pain, nosebleeds, postnasal drip, rhinorrhea, sinus pressure, sneezing, sore throat and trouble swallowing.   Eyes: Negative for redness and itching.  Respiratory: Positive for cough, chest tightness, shortness of breath and wheezing.   Cardiovascular: Negative for palpitations and leg swelling.  Gastrointestinal: Negative for nausea and vomiting.  Genitourinary: Negative for dysuria.  Musculoskeletal: Negative for joint swelling.  Skin: Negative for rash.  Neurological: Negative for headaches.  Hematological: Does not bruise/bleed easily.  Psychiatric/Behavioral: Negative for dysphoric mood. The patient is not nervous/anxious.        Objective:   Physical Exam Vitals:   08/29/18 1040  BP: 118/64  Pulse: 71  SpO2: 91%  Weight: 243 lb (110.2 kg)  Height: 5\' 10"  (1.778 m)   Gen: Pleasant, well-nourished, in no distress,  normal  affect  ENT: No lesions,  mouth clear,  oropharynx clear, no postnasal drip  Neck: No JVD, no stridor  Lungs: No use of accessory muscles, very distant, clear without rales or rhonchi  Cardiovascular: RRR, heart sounds normal, no murmur or gallops, no peripheral edema  Musculoskeletal: No deformities, no cyanosis or clubbing  Neuro: alert, non focal  Skin: Warm, no lesions or rashes    CXR 08/30/17 --  COMPARISON:  09/10/2014  FINDINGS: There is no focal parenchymal opacity. There is no pleural effusion or pneumothorax. The heart and mediastinal contours are unremarkable. There is evidence of prior CABG.  The osseous structures are unremarkable.  IMPRESSION: No active cardiopulmonary disease.      Assessment & Plan:  COPD (chronic obstructive pulmonary disease) ([place]) I discussed with him possibly going back on scheduled bronchodilator therapy.  He is not interested.  He was unable to convince me that he gets any true benefit from the albuterol but is a difficult history giver.  He does take it 1-2 times daily, says that it may "ease his mind".  Question placebo effect?  I suspect that he is getting something out of it.  Offered to restart Stiolto today but he wants to defer.  Flu shot, pneumonia shot up-to-date  Hypoxemia Exertional hypoxemia documented on previous exam.  He is not interested in oxygen, does not think he needs it.  Actually this is not entirely clear, but he does not want a repeat walking oximetry right now.  I can revisit with him next time or if his symptoms change.  Baltazar Apo, MD, PhD 08/29/2018, 11:13 AM [place] [facility] [phone] or if no answer [phone]

## 2018-08-31 ENCOUNTER — Other Ambulatory Visit: Payer: Self-pay | Admitting: Physician Assistant

## 2018-09-01 ENCOUNTER — Other Ambulatory Visit: Payer: Self-pay | Admitting: Emergency Medicine

## 2018-09-28 DIAGNOSIS — Z794 Long term (current) use of insulin: Secondary | ICD-10-CM | POA: Diagnosis not present

## 2018-09-28 DIAGNOSIS — E039 Hypothyroidism, unspecified: Secondary | ICD-10-CM | POA: Diagnosis not present

## 2018-09-28 DIAGNOSIS — E782 Mixed hyperlipidemia: Secondary | ICD-10-CM | POA: Diagnosis not present

## 2018-09-28 DIAGNOSIS — E1165 Type 2 diabetes mellitus with hyperglycemia: Secondary | ICD-10-CM | POA: Diagnosis not present

## 2018-10-02 DIAGNOSIS — E1122 Type 2 diabetes mellitus with diabetic chronic kidney disease: Secondary | ICD-10-CM | POA: Diagnosis not present

## 2018-10-02 DIAGNOSIS — E782 Mixed hyperlipidemia: Secondary | ICD-10-CM | POA: Diagnosis not present

## 2018-10-02 DIAGNOSIS — I129 Hypertensive chronic kidney disease with stage 1 through stage 4 chronic kidney disease, or unspecified chronic kidney disease: Secondary | ICD-10-CM | POA: Diagnosis not present

## 2018-10-02 DIAGNOSIS — N183 Chronic kidney disease, stage 3 (moderate): Secondary | ICD-10-CM | POA: Diagnosis not present

## 2018-10-02 DIAGNOSIS — E1165 Type 2 diabetes mellitus with hyperglycemia: Secondary | ICD-10-CM | POA: Diagnosis not present

## 2018-10-02 DIAGNOSIS — E039 Hypothyroidism, unspecified: Secondary | ICD-10-CM | POA: Diagnosis not present

## 2018-10-02 DIAGNOSIS — Z794 Long term (current) use of insulin: Secondary | ICD-10-CM | POA: Diagnosis not present

## 2018-12-04 ENCOUNTER — Other Ambulatory Visit: Payer: Self-pay

## 2018-12-04 DIAGNOSIS — N2581 Secondary hyperparathyroidism of renal origin: Secondary | ICD-10-CM | POA: Diagnosis not present

## 2018-12-04 DIAGNOSIS — Z9119 Patient's noncompliance with other medical treatment and regimen: Secondary | ICD-10-CM | POA: Diagnosis not present

## 2018-12-04 DIAGNOSIS — I25119 Atherosclerotic heart disease of native coronary artery with unspecified angina pectoris: Secondary | ICD-10-CM | POA: Diagnosis not present

## 2018-12-04 DIAGNOSIS — I129 Hypertensive chronic kidney disease with stage 1 through stage 4 chronic kidney disease, or unspecified chronic kidney disease: Secondary | ICD-10-CM | POA: Diagnosis not present

## 2018-12-04 DIAGNOSIS — N183 Chronic kidney disease, stage 3 (moderate): Secondary | ICD-10-CM | POA: Diagnosis not present

## 2018-12-04 DIAGNOSIS — N184 Chronic kidney disease, stage 4 (severe): Secondary | ICD-10-CM | POA: Diagnosis not present

## 2018-12-04 DIAGNOSIS — I6350 Cerebral infarction due to unspecified occlusion or stenosis of unspecified cerebral artery: Secondary | ICD-10-CM | POA: Diagnosis not present

## 2018-12-04 DIAGNOSIS — D631 Anemia in chronic kidney disease: Secondary | ICD-10-CM | POA: Diagnosis not present

## 2018-12-04 DIAGNOSIS — E1122 Type 2 diabetes mellitus with diabetic chronic kidney disease: Secondary | ICD-10-CM | POA: Diagnosis not present

## 2018-12-04 DIAGNOSIS — E039 Hypothyroidism, unspecified: Secondary | ICD-10-CM | POA: Diagnosis not present

## 2018-12-04 DIAGNOSIS — E8779 Other fluid overload: Secondary | ICD-10-CM | POA: Diagnosis not present

## 2018-12-04 DIAGNOSIS — E782 Mixed hyperlipidemia: Secondary | ICD-10-CM | POA: Diagnosis not present

## 2018-12-04 DIAGNOSIS — R809 Proteinuria, unspecified: Secondary | ICD-10-CM | POA: Diagnosis not present

## 2018-12-04 MED ORDER — ISOSORBIDE MONONITRATE ER 60 MG PO TB24: 60.0000 mg | ORAL_TABLET | Freq: Every day | ORAL | 3 refills | Status: DC

## 2018-12-05 ENCOUNTER — Other Ambulatory Visit: Payer: Self-pay

## 2018-12-22 ENCOUNTER — Other Ambulatory Visit: Payer: Self-pay | Admitting: Interventional Cardiology

## 2019-02-04 DIAGNOSIS — M545 Low back pain: Secondary | ICD-10-CM | POA: Diagnosis not present

## 2019-04-04 DIAGNOSIS — Z23 Encounter for immunization: Secondary | ICD-10-CM | POA: Diagnosis not present

## 2019-04-25 DIAGNOSIS — N184 Chronic kidney disease, stage 4 (severe): Secondary | ICD-10-CM | POA: Diagnosis not present

## 2019-04-25 DIAGNOSIS — E1122 Type 2 diabetes mellitus with diabetic chronic kidney disease: Secondary | ICD-10-CM | POA: Diagnosis not present

## 2019-04-25 DIAGNOSIS — I129 Hypertensive chronic kidney disease with stage 1 through stage 4 chronic kidney disease, or unspecified chronic kidney disease: Secondary | ICD-10-CM | POA: Diagnosis not present

## 2019-04-25 DIAGNOSIS — I25119 Atherosclerotic heart disease of native coronary artery with unspecified angina pectoris: Secondary | ICD-10-CM | POA: Diagnosis not present

## 2019-04-25 DIAGNOSIS — N183 Chronic kidney disease, stage 3 unspecified: Secondary | ICD-10-CM | POA: Diagnosis not present

## 2019-05-02 DIAGNOSIS — E039 Hypothyroidism, unspecified: Secondary | ICD-10-CM | POA: Diagnosis not present

## 2019-05-02 DIAGNOSIS — E782 Mixed hyperlipidemia: Secondary | ICD-10-CM | POA: Diagnosis not present

## 2019-05-02 DIAGNOSIS — E1165 Type 2 diabetes mellitus with hyperglycemia: Secondary | ICD-10-CM | POA: Diagnosis not present

## 2019-05-02 DIAGNOSIS — Z794 Long term (current) use of insulin: Secondary | ICD-10-CM | POA: Diagnosis not present

## 2019-05-07 DIAGNOSIS — I129 Hypertensive chronic kidney disease with stage 1 through stage 4 chronic kidney disease, or unspecified chronic kidney disease: Secondary | ICD-10-CM | POA: Diagnosis not present

## 2019-05-07 DIAGNOSIS — E782 Mixed hyperlipidemia: Secondary | ICD-10-CM | POA: Diagnosis not present

## 2019-05-07 DIAGNOSIS — Z794 Long term (current) use of insulin: Secondary | ICD-10-CM | POA: Diagnosis not present

## 2019-05-07 DIAGNOSIS — N1831 Chronic kidney disease, stage 3a: Secondary | ICD-10-CM | POA: Diagnosis not present

## 2019-05-07 DIAGNOSIS — E039 Hypothyroidism, unspecified: Secondary | ICD-10-CM | POA: Diagnosis not present

## 2019-05-07 DIAGNOSIS — E1122 Type 2 diabetes mellitus with diabetic chronic kidney disease: Secondary | ICD-10-CM | POA: Diagnosis not present

## 2019-06-17 ENCOUNTER — Telehealth: Payer: Self-pay | Admitting: *Deleted

## 2019-06-17 NOTE — Telephone Encounter (Signed)
Called pt off recall list to schedule f/u appt. Pt has given verbal consent for virtual appointment 06/24/2019 with Luke Copa, PA-C     Virtual Visit Pre-Appointment Phone Call  "(Name), I am calling you today to discuss your upcoming appointment. We are currently trying to limit exposure to the virus that causes COVID-19 by seeing patients at home rather than in the office."  1. "What is the BEST phone number to call the day of the visit?" - include this in appointment notes  2. "Do you have or have access to (through a family member/friend) a smartphone with video capability that we can use for your visit?" a. If yes - list this number in appt notes as "cell" (if different from BEST phone #) and list the appointment type as a VIDEO visit in appointment notes b. If no - list the appointment type as a PHONE visit in appointment notes  3. Confirm consent - "In the setting of the current Covid19 crisis, you are scheduled for a (phone or video) visit with your provider on (date) at (time).  Just as we do with many in-office visits, in order for you to participate in this visit, we must obtain consent.  If you'd like, I can send this to your mychart (if signed up) or email for you to review.  Otherwise, I can obtain your verbal consent now.  All virtual visits are billed to your insurance company just like a normal visit would be.  By agreeing to a virtual visit, we'd like you to understand that the technology does not allow for your provider to perform an examination, and thus may limit your provider's ability to fully assess your condition. If your provider identifies any concerns that need to be evaluated in person, we will make arrangements to do so.  Finally, though the technology is pretty good, we cannot assure that it will always work on either your or our end, and in the setting of a video visit, we may have to convert it to a phone-only visit.  In either situation, we cannot ensure that we  have a secure connection.  Are you willing to proceed?" STAFF: Did the patient verbally acknowledge consent to telehealth visit? Document YES/NO here: PT GAVE VERBAL CONSENT   4. Advise patient to be prepared - "Two hours prior to your appointment, go ahead and check your blood pressure, pulse, oxygen saturation, and your weight (if you have the equipment to check those) and write them all down. When your visit starts, your provider will ask you for this information. If you have an Apple Watch or Kardia device, please plan to have heart rate information ready on the day of your appointment. Please have a pen and paper handy nearby the day of the visit as well."  5. Give patient instructions for MyChart download to smartphone OR Doximity/Doxy.me as below if video visit (depending on what platform provider is using)  6. Inform patient they will receive a phone call 15 minutes prior to their appointment time (may be from unknown caller ID) so they should be prepared to answer    TELEPHONE CALL NOTE  Luke [name] has been deemed a candidate for a follow-up tele-health visit to limit community exposure during the Covid-19 pandemic. I spoke with the patient via phone to ensure availability of phone/video source, confirm preferred email & phone number, and discuss instructions and expectations.  I reminded Luke [name] to be prepared with any vital sign and/or heart rhythm information  that could potentially be obtained via home monitoring, at the time of his visit. I reminded Luke [name] to expect a phone call prior to his visit.  Luke [name], [place] 06/17/2019 5:12 PM   INSTRUCTIONS FOR DOWNLOADING THE MYCHART APP TO SMARTPHONE  - The patient must first make sure to have activated MyChart and know their login information - If Apple, go to [employer] and type in MyChart in the search bar and download the app. If Android, ask patient to go to [employer] and type in Arnold in the search  bar and download the app. The app is free but as with any other app downloads, their phone may require them to verify saved payment information or Apple/Android password.  - The patient will need to then log into the app with their MyChart username and password, and select [facility] as their healthcare provider to link the account. When it is time for your visit, go to the MyChart app, find appointments, and click Begin Video Visit. Be sure to Select Allow for your device to access the Microphone and Camera for your visit. You will then be connected, and your provider will be with you shortly.  **If they have any issues connecting, or need assistance please contact MyChart service desk [phone])**  **If using a computer, in order to ensure the best quality for their visit they will need to use either of the following Internet Browsers: [employer], or Google Chrome**  IF USING DOXIMITY or DOXY.ME - The patient will receive a link just prior to their visit by text.     FULL LENGTH CONSENT FOR TELE-HEALTH VISIT   I hereby voluntarily request, consent and authorize [facility] and its employed or contracted physicians, physician assistants, nurse practitioners or other licensed health care professionals (the Practitioner), to provide me with telemedicine health care services (the "Services") as deemed necessary by the treating Practitioner. I acknowledge and consent to receive the Services by the Practitioner via telemedicine. I understand that the telemedicine visit will involve communicating with the Practitioner through live audiovisual communication technology and the disclosure of certain medical information by electronic transmission. I acknowledge that I have been given the opportunity to request an in-person assessment or other available alternative prior to the telemedicine visit and am voluntarily participating in the telemedicine visit.  I understand that I have the  right to withhold or withdraw my consent to the use of telemedicine in the course of my care at any time, without affecting my right to future care or treatment, and that the Practitioner or I may terminate the telemedicine visit at any time. I understand that I have the right to inspect all information obtained and/or recorded in the course of the telemedicine visit and may receive copies of available information for a reasonable fee.  I understand that some of the potential risks of receiving the Services via telemedicine include:  Marland Kitchen Delay or interruption in medical evaluation due to technological equipment failure or disruption; . Information transmitted may not be sufficient (e.g. poor resolution of images) to allow for appropriate medical decision making by the Practitioner; and/or  . In rare instances, security protocols could fail, causing a breach of personal health information.  Furthermore, I acknowledge that it is my responsibility to provide information about my medical history, conditions and care that is complete and accurate to the best of my ability. I acknowledge that Practitioner's advice, recommendations, and/or decision may be based on factors not within  their control, such as incomplete or inaccurate data provided by me or distortions of diagnostic images or specimens that may result from electronic transmissions. I understand that the practice of medicine is not an exact science and that Practitioner makes no warranties or guarantees regarding treatment outcomes. I acknowledge that I will receive a copy of this consent concurrently upon execution via email to the email address I last provided but may also request a printed copy by calling the office of [facility].    I understand that my insurance will be billed for this visit.   I have read or had this consent read to me. . I understand the contents of this consent, which adequately explains the benefits and risks of the Services  being provided via telemedicine.  . I have been provided ample opportunity to ask questions regarding this consent and the Services and have had my questions answered to my satisfaction. . I give my informed consent for the services to be provided through the use of telemedicine in my medical care  By participating in this telemedicine visit I agree to the above.

## 2019-06-21 NOTE — Progress Notes (Deleted)
{Choose 1 Note Type (Telehealth Visit or Telephone Visit):[id number]} Virtual platform was offered given ongoing worsening Covid-19 pandemic.  Date:  06/21/2019   ID:  Luke [name], DOB March 06, 1933, MRN [id number]  Patient Location: Home Provider Location: [facility] Office  PCP:  Radene Ou, Bill Salinas, MD  Cardiologist:  Larae Grooms, MD  Electrophysiologist:  None   Evaluation Performed:  Follow-Up Visit  Chief Complaint:  Routine f/u CAD  History of Present Illness:    Luke [name] is a 85 y.o. male with CAD (with MI/CABG in 9/99 at [facility], [place] with [place] to LAD, LIMA to OM, SVG to PDA, LAD drug eluting stent in 3/09 through the graft to the distal LAD), mildly dilated aortic root by echo 11/2017, DM, stroke, chronic diastolic CHF, probable CKD stage III, HTN, HLD, hiatal hernia, COPD with hypoxia who presents for routine follow-up.  Last stress test 2013 showed mild ischemia in apical lateral region, EF 70%, with recommendation consider cath for symptoms. Dr. Irish Lack has discussed cath with patient in the past for recurrent CP when he "overexerts" himself he wished to avoid due to renal dysfunction. I met him in 08/2017 at which time he was having issues with SOB and hypoxia. Pulmonary function testing confirmed severe obstruction with a positive bronchodilator response, so pulmonary medications were adjusted.  In general the patient historically has not wanted to "rock the boat" with further testing/meds or overdo it given his age. 2D echo 11/2018 showed severe focal basal hypertrophy, EF 55-60%, grade 2 DD, mildly dilated aortic root, mild LAE. Last labs by [facility] 04/2019 with normal LFTs, Cr 1.56, Hgb 15.5, 09/2018 A1C 6.9, 06/2018 TSH wnl, 08/2017 K 4.9.   CAD Mildly dilated aortic root Essential HTN Hyperlipidemia  The patient {does/does not:200015} have symptoms concerning for COVID-19 infection (fever, chills, cough, or new shortness of breath).    Past  Medical History:  Diagnosis Date  . Anginal pain ([place]) 02/08/12  . Arthritis    "all over"  . CKD (chronic kidney disease), stage III ([place])   . COPD (chronic obstructive pulmonary disease) ([place])   . Coronary artery disease    a. MI/CABG in 9/99 at [facility], [place] with (RIMA to LAD, LIMA to OM, SVG to PDA. b. LAD drug eluting stent in 3/09 through the graft to the distal LAD.  Marland Kitchen Exertional dyspnea    "doesn't take much these days"  . H/O hiatal hernia   . Hyperlipemia   . Hypertension   . Hypothyroidism   . Hypoxia   . Stroke [facility]) 09/2010   denies residual  . Type II diabetes mellitus ([place])    Past Surgical History:  Procedure Laterality Date  . CATARACT EXTRACTION W/ INTRAOCULAR LENS  IMPLANT, BILATERAL  ~ 2007  . COLONOSCOPY W/ POLYPECTOMY  09/2011   "removed 7"  . CORONARY ANGIOPLASTY WITH STENT PLACEMENT  ~ 2004   "1"  . CORONARY ARTERY BYPASS GRAFT  1999   CABG X4  . EYE SURGERY    . RETINAL DETACHMENT SURGERY  10/2011   right eye     No outpatient medications have been marked as taking for the 06/24/19 encounter (Appointment) with Charlie Pitter, PA-C.     Allergies:   Patient has no known allergies.   Social History   Tobacco Use  . Smoking status: Former Smoker    Packs/day: 2.00    Years: 35.00    Pack years: 70.00    Types: Cigarettes    Quit  date: 07/25/1993    Years since quitting: 25.9  . Smokeless tobacco: Former Systems developer    Quit date: 07/18/1995  Substance Use Topics  . Alcohol use: No    Comment: 02/08/12 "stopped ~ 1995"  . Drug use: No     Family Hx: The patient's family history includes Heart attack in his mother.  ROS:   Please see the history of present illness.    All other systems reviewed and are negative.   Prior CV studies:    Most recent pertinent cardiac studies are outlined above.  Labs/Other Tests and Data Reviewed:    EKG:  An ECG dated 12/26/17 was personally reviewed today and demonstrated:  NSR 63bpm first  degree AVB without acute STT changes  Recent Labs: No results found for requested labs within last 8760 hours.   Recent Lipid Panel No results found for: CHOL, TRIG, HDL, CHOLHDL, LDLCALC, LDLDIRECT  Wt Readings from Last 3 Encounters:  08/29/18 243 lb (110.2 kg)  08/07/18 249 lb (112.9 kg)  02/27/18 247 lb (112 kg)     Objective:    Vital Signs:  There were no vitals taken for this visit.   VS reviewed. General - *** in no acute distress Pulm - No labored breathing, no coughing during visit, no audible wheezing, speaking in full sentences Neuro - A+Ox3, no slurred speech, answers questions appropriately Psych - Pleasant affect     ASSESSMENT & PLAN:    1. ***  COVID-19 Education: The signs and symptoms of COVID-19 were discussed with the patient and how to seek care for testing (follow up with PCP or arrange E-visit).  The importance of social distancing was discussed today.  Time:   Today, I have spent *** minutes with the patient with telehealth technology discussing the above problems.     Medication Adjustments/Labs and Tests Ordered: Current medicines are reviewed at length with the patient today.  Concerns regarding medicines are outlined above.   Follow Up:  {F/U [phone]} {follow up:15908}  Signed, Charlie Pitter, PA-C  06/21/2019 4:31 PM    [facility]

## 2019-06-24 ENCOUNTER — Telehealth: Payer: Medicare Other | Admitting: Physician Assistant

## 2019-06-24 ENCOUNTER — Other Ambulatory Visit: Payer: Self-pay

## 2019-06-28 ENCOUNTER — Other Ambulatory Visit: Payer: Self-pay | Admitting: Emergency Medicine

## 2019-07-01 NOTE — Progress Notes (Signed)
Virtual Visit via Telephone Note   This visit type was conducted due to national recommendations for restrictions regarding the COVID-19 Pandemic (e.g. social distancing) in an effort to limit this patient's exposure and mitigate transmission in our community.  Due to his co-morbid illnesses, this patient is at least at moderate risk for complications without adequate follow up.  This format is felt to be most appropriate for this patient at this time.  The patient did not have access to video technology/had technical difficulties with video requiring transitioning to audio format only (telephone).  All issues noted in this document were discussed and addressed.  No physical exam could be performed with this format.  Please refer to the patient's chart for his  consent to telehealth for [facility]. Virtual platform was offered given ongoing worsening Covid-19 pandemic.  Date:  07/02/2019   ID:  Luke [name], DOB Jan 07, 1933, MRN [id number]  Patient Location: Home Provider Location: Home  PCP:  Aretta Nip, MD  Cardiologist:  Larae Grooms, MD  Electrophysiologist:  None   Evaluation Performed:  Follow-Up Visit  Chief Complaint:  Routine f/u CAD  History of Present Illness:    Luke [name] is a 85 y.o. male with CAD (with MI/CABG in 9/99 at [facility], [place] with [place] to LAD, LIMA to OM, SVG to PDA, LAD drug eluting stent in 3/09 through the graft to the distal LAD), mildly dilated aortic root by echo 11/2017, DM, stroke, chronic diastolic CHF, probable CKD stage III, HTN, HLD, hiatal hernia, COPD with hypoxia who presents for routine follow-up virtually.  Last stress test 2013 showed mild ischemia in apical lateral region, EF 70%, with recommendation consider cath for symptoms. Dr. Irish Lack has discussed cath with patient in the past for recurrent CP when he "overexerts" himself he wished to avoid due to renal dysfunction. I met him in 08/2017 at which time he  was having issues with SOB and hypoxia. Pulmonary function testing confirmed severe obstruction with a positive bronchodilator response, so pulmonary medications were adjusted. In general the patient historically has not wanted to "rock the boat" with further testing/meds or overdo it given his age. 2D echo 11/2017 showed severe focal basal hypertrophy, EF 55-60%, grade 2 DD, mildly dilated aortic root, mild LAE.   He is seen back for virtual follow-up doing well. He has rare angina for which he uses 1-2 SL NTG with relief - this is unchanged from prior years. Overall he is pleased with how he is doing and denies any new accelerating changes. He "moves slower" at his age. No palpitations, syncope, dizziness, orthopnea, or edema reported. He clarifies he has been taking Lasix 47m 2 tab QAM / 1 tab QPM for quite some time. Last labs by [facility] 05/02/19 showed K 4.1, Cr 1.29, normal LFTs, [facility] 04/25/19 Hgb 15.5, 09/2018 A1C 6.9, 06/2018 TSH wnl.  The patient does not have symptoms concerning for COVID-19 infection (fever, chills, cough, or new shortness of breath).    Past Medical History:  Diagnosis Date  . Anginal pain [place] 02/08/[place]  . Arthritis    "all over"  . CKD (chronic kidney disease), stage III   . COPD [place] disease) [place]   . Coronary artery disease    [facility] [place], LIMA to OM, SVG to PDA. b. LAD drug eluting stent in 3/09 through the graft to the distal LAD.  .Marland KitchenExertional dyspnea    "  doesn't take much these days"  . H/O hiatal hernia   . Hyperlipemia   . Hypertension   . Hypothyroidism   . Hypoxia   . Stroke [facility]) 09/2010   denies residual  . Type II diabetes mellitus ([place])    Past Surgical History:  Procedure Laterality Date  . CATARACT EXTRACTION W/ INTRAOCULAR LENS  IMPLANT, BILATERAL  ~ 2007  . COLONOSCOPY W/ POLYPECTOMY  09/2011   "removed 7"  . CORONARY ANGIOPLASTY WITH STENT  PLACEMENT  ~ 2004   "1"  . CORONARY ARTERY BYPASS GRAFT  1999   CABG X4  . EYE SURGERY    . RETINAL DETACHMENT SURGERY  10/2011   right eye     Current Meds  Medication Sig  . albuterol (VENTOLIN HFA) 108 (90 Base) MCG/ACT inhaler INHALE 2 PUFFS INTO THE LUNGS EVERY 4 (FOUR) HOURS AS NEEDED FOR WHEEZING OR SHORTNESS OF BREATH.  Marland Kitchen amLODipine (NORVASC) 5 MG tablet Take 5 mg by mouth daily.  Marland Kitchen aspirin EC 81 MG tablet Take 1 tablet (81 mg total) by mouth daily.  Marland Kitchen atenolol (TENORMIN) 25 MG tablet Take 25 mg by mouth daily.  Marland Kitchen atorvastatin (LIPITOR) 20 MG tablet TAKE 1 TABLET BY MOUTH EVERY DAY  . furosemide (LASIX) 80 MG tablet Patient states he takes 2 tablets by mouth every morning and 1 tablet every afternoon   . insulin aspart (NOVOLOG) 100 UNIT/ML injection Inject 14-15 Units into the skin 3 (three) times daily with meals.   . insulin detemir (LEVEMIR) 100 UNIT/ML injection Inject 20-22 Units into the skin 2 (two) times daily.   . isosorbide mononitrate (IMDUR) 60 MG 24 hr tablet Take 1 tablet (60 mg total) by mouth daily.  Marland Kitchen levothyroxine (SYNTHROID, LEVOTHROID) 75 MCG tablet Take 75 mcg by mouth daily.  . nitroGLYCERIN (NITROSTAT) 0.4 MG SL tablet DISSOLVE 1 TABLET UNDER THE TONGUE EVERY 5 MINUTES AS NEEDED FOR CHEST PAIN  . polyvinyl alcohol (LIQUIFILM TEARS) 1.4 % ophthalmic solution Place 1 drop into both eyes as needed (For dry eyes).  Marland Kitchen umeclidinium-vilanterol (ANORO ELLIPTA) 62.5-25 MCG/INH AEPB Inhale 1 puff into the lungs daily.     Allergies:   Patient has no known allergies.   Social History   Tobacco Use  . Smoking status: Former Smoker    Packs/day: 2.00    Years: 35.00    Pack years: 70.00    Types: Cigarettes    Quit date: 07/25/1993    Years since quitting: 25.9  . Smokeless tobacco: Former Systems developer    Quit date: 07/18/1995  Substance Use Topics  . Alcohol use: No    Comment: 02/08/12 "stopped ~ 1995"  . Drug use: No     Family Hx: The patient's family history  includes Heart attack in his mother.  ROS:   Please see the history of present illness.    All other systems reviewed and are negative.   Prior CV studies:    Most recent pertinent cardiac studies are outlined above.  Labs/Other Tests and Data Reviewed:    EKG:  An ECG dated 12/26/17 was personally reviewed today and demonstrated:  NSR 63bpm first degree AVB without acute STT changes   Recent Labs: No results found for requested labs within last 8760 hours.   Recent Lipid Panel No results found for: CHOL, TRIG, HDL, CHOLHDL, LDLCALC, LDLDIRECT  Wt Readings from Last 3 Encounters:  07/02/19 246 lb (111.6 kg)  08/29/18 243 lb (110.2 kg)  08/07/18 249 lb (112.9 kg)  Objective:    Vital Signs:  Ht 5' 11" (1.803 m)   Wt 246 lb (111.6 kg)   BMI 34.31 kg/m    VS reviewed. General - calm M in no acute distress Pulm - No labored breathing, no coughing during visit, no audible wheezing, speaking in full sentences Neuro - A+Ox3, no slurred speech, answers questions appropriately Psych - Pleasant affect     ASSESSMENT & PLAN:    1. CAD with chronic stable angina - stable symptoms without recent acceleration. Dr. Irish Lack has previously recommended medical management and I agree with this plan. The patient himself historically prefers not to "rock the boat" as well. Continue ASA, amlodipine, BB, Imdur and statin. 2. Mildly dilated aortic root - he is due for recheck of this. However, in context of his advanced age and worsening pandemic with risk of community exposure, per our discussion, he would prefer to defer routine screening for now and can revisit at f/u OV. Given functional status and comorbidities, conservative approach may be ideal anyway. 3. Essential HTN - he checks this a few times a month but his BP cuff ran out of batteries. He will call us with his most recent reading when he can. 4. Hyperlipidemia - continue statin. He is overdue for lipid check but as above, in  context of pandemic and risk of community exposure, we will hold off on acutely rechecking. He also does not wish to make any medication changes with further supports this decision. LFTs were normal recently otherwise. 5. Chronic diastolic CHF - will addend medication list to reflect dose of Lasix he's taking. He reports he's actually been on this dose for quite some time and feels well on it. Creatinine 05/02/19 was stable and potassium was normal. Reviewed 2g sodium restriction, 2L fluid restriction, daily weights with patient.  COVID-19 Education: The signs and symptoms of COVID-19 were discussed with the patient and how to seek care for testing (follow up with PCP or arrange E-visit).  The importance of social distancing was discussed today.  Time:   Today, I have spent 20 minutes with the patient with telehealth technology discussing the above problems.     Medication Adjustments/Labs and Tests Ordered: Current medicines are reviewed at length with the patient today.  Concerns regarding medicines are outlined above.   Follow Up: in 6 months in person with Dr. Irish Lack  Signed, Charlie Pitter, PA-C  07/02/2019 9:38 AM    [place]

## 2019-07-02 ENCOUNTER — Encounter: Payer: Self-pay | Admitting: Physician Assistant

## 2019-07-02 ENCOUNTER — Other Ambulatory Visit: Payer: Self-pay

## 2019-07-02 ENCOUNTER — Telehealth (INDEPENDENT_AMBULATORY_CARE_PROVIDER_SITE_OTHER): Payer: Medicare Other | Admitting: Physician Assistant

## 2019-07-02 VITALS — Ht 71.0 in | Wt 246.0 lb

## 2019-07-02 DIAGNOSIS — I77810 Thoracic aortic ectasia: Secondary | ICD-10-CM

## 2019-07-02 DIAGNOSIS — I10 Essential (primary) hypertension: Secondary | ICD-10-CM

## 2019-07-02 DIAGNOSIS — E785 Hyperlipidemia, unspecified: Secondary | ICD-10-CM

## 2019-07-02 DIAGNOSIS — I5032 Chronic diastolic (congestive) heart failure: Secondary | ICD-10-CM

## 2019-07-02 DIAGNOSIS — I25118 Atherosclerotic heart disease of native coronary artery with other forms of angina pectoris: Secondary | ICD-10-CM

## 2019-07-02 NOTE — Patient Instructions (Signed)
Medication Instructions:  Your physician recommends that you continue on your current medications as directed. Please refer to the Current Medication list given to you today.  *If you need a refill on your cardiac medications before your next appointment, please call your pharmacy*  Lab Work: None ordered  If you have labs (blood work) drawn today and your tests are completely normal, you will receive your results only by: Marland Kitchen MyChart Message (if you have MyChart) OR . A paper copy in the mail If you have any lab test that is abnormal or we need to change your treatment, we will call you to review the results.  Testing/Procedures: None ordered  Follow-Up: At [facility], you and your health needs are our priority.  As part of our continuing mission to provide you with exceptional heart care, we have created designated Provider Care Teams.  These Care Teams include your primary Cardiologist (physician) and Advanced Practice Providers (APPs -  Physician Assistants and Nurse Practitioners) who all work together to provide you with the care you need, when you need it.  Your next appointment:   6 month(s)  The format for your next appointment:   In Person  Provider:   You may see Larae Grooms, MD or one of the following Advanced Practice Providers on your designated Care Team:    Melina Copa, PA-C  Ermalinda Barrios, PA-C   Other Instructions "To check your blood pressure, choose a time about 3 hours after taking your blood pressure medicines. Remain seated in a chair for a few minutes quietly beforehand, then check it. When you get that reading, please call our office to let us know.   For patients with congestive heart failure, we give them these special instructions:  1. Follow a low-salt diet - you are allowed no more than 2,000mg  of sodium per day. Watch your fluid intake. In general, you should not be taking in more than 2 liters of fluid per day (no more than 8 glasses per  day). This includes sources of water in foods like soup, coffee, tea, milk, etc.  2. Weigh yourself on the same scale at same time of day and keep a log.  3. Call your doctor: (Anytime you feel any of the following symptoms)  - 3lb weight gain overnight or 5lb within a few days  - Shortness of breath, with or without a dry hacking cough  - Swelling in the hands, feet or stomach  - If you have to sleep on extra pillows at night in order to breathe  IT IS IMPORTANT TO LET YOUR DOCTOR KNOW EARLY ON IF YOU ARE HAVING SYMPTOMS SO WE CAN HELP YOU!"

## 2019-07-09 ENCOUNTER — Telehealth: Payer: Self-pay | Admitting: Physician Assistant

## 2019-07-09 NOTE — Telephone Encounter (Signed)
Called pt re: message below.  He confirms that he is taking Atenolol and it is 25 mg daily. Pt has been made aware to call [employer] to let them know he is currently on that. Pt verbalized understanding.

## 2019-07-09 NOTE — Telephone Encounter (Signed)
   Please call pt. We received a letter from his insurance company asking [place] to consider adding a beta blocker medicine but he is already on atenolol by our records. Please clarify that he is still taking and if he is, he should call [employer] to let them know. Lakely Elmendorf PA-C

## 2019-08-06 DIAGNOSIS — Z794 Long term (current) use of insulin: Secondary | ICD-10-CM | POA: Diagnosis not present

## 2019-08-06 DIAGNOSIS — E039 Hypothyroidism, unspecified: Secondary | ICD-10-CM | POA: Diagnosis not present

## 2019-08-06 DIAGNOSIS — E1165 Type 2 diabetes mellitus with hyperglycemia: Secondary | ICD-10-CM | POA: Diagnosis not present

## 2019-08-06 DIAGNOSIS — E782 Mixed hyperlipidemia: Secondary | ICD-10-CM | POA: Diagnosis not present

## 2019-08-08 DIAGNOSIS — N1831 Chronic kidney disease, stage 3a: Secondary | ICD-10-CM | POA: Diagnosis not present

## 2019-08-08 DIAGNOSIS — E1122 Type 2 diabetes mellitus with diabetic chronic kidney disease: Secondary | ICD-10-CM | POA: Diagnosis not present

## 2019-08-08 DIAGNOSIS — I129 Hypertensive chronic kidney disease with stage 1 through stage 4 chronic kidney disease, or unspecified chronic kidney disease: Secondary | ICD-10-CM | POA: Diagnosis not present

## 2019-08-08 DIAGNOSIS — Z794 Long term (current) use of insulin: Secondary | ICD-10-CM | POA: Diagnosis not present

## 2019-08-08 DIAGNOSIS — E782 Mixed hyperlipidemia: Secondary | ICD-10-CM | POA: Diagnosis not present

## 2019-08-08 DIAGNOSIS — E039 Hypothyroidism, unspecified: Secondary | ICD-10-CM | POA: Diagnosis not present

## 2019-08-21 ENCOUNTER — Other Ambulatory Visit: Payer: Self-pay | Admitting: Physician Assistant

## 2019-09-11 ENCOUNTER — Other Ambulatory Visit: Payer: Self-pay | Admitting: Emergency Medicine

## 2019-09-18 ENCOUNTER — Other Ambulatory Visit: Payer: Self-pay | Admitting: Emergency Medicine

## 2019-10-19 ENCOUNTER — Telehealth: Payer: Self-pay | Admitting: Internal Medicine

## 2019-10-19 NOTE — Telephone Encounter (Signed)
paient paged via answering svc over 1h ago. I was busy with criticaly ill patients. Page was that he wanted refilll on albuterol. I called him 12:59 PM and his wifes cell but no answer. Unable to leave VM wit hinm or wife  Left [facility] with [facility]

## 2019-10-21 ENCOUNTER — Other Ambulatory Visit: Payer: Self-pay

## 2019-10-21 MED ORDER — ALBUTEROL SULFATE HFA 108 (90 BASE) MCG/ACT IN AERS: 2.0000 | INHALATION_SPRAY | RESPIRATORY_TRACT | 2 refills | Status: DC | PRN

## 2019-10-21 NOTE — Telephone Encounter (Signed)
I called and spoke with the patient's wife and advised her that I would send in a refill for the Albuterol.

## 2019-11-04 DIAGNOSIS — E039 Hypothyroidism, unspecified: Secondary | ICD-10-CM | POA: Diagnosis not present

## 2019-11-04 DIAGNOSIS — E1165 Type 2 diabetes mellitus with hyperglycemia: Secondary | ICD-10-CM | POA: Diagnosis not present

## 2019-11-04 DIAGNOSIS — E782 Mixed hyperlipidemia: Secondary | ICD-10-CM | POA: Diagnosis not present

## 2019-11-04 DIAGNOSIS — Z794 Long term (current) use of insulin: Secondary | ICD-10-CM | POA: Diagnosis not present

## 2019-11-07 DIAGNOSIS — E1122 Type 2 diabetes mellitus with diabetic chronic kidney disease: Secondary | ICD-10-CM | POA: Diagnosis not present

## 2019-11-07 DIAGNOSIS — E039 Hypothyroidism, unspecified: Secondary | ICD-10-CM | POA: Diagnosis not present

## 2019-11-07 DIAGNOSIS — E782 Mixed hyperlipidemia: Secondary | ICD-10-CM | POA: Diagnosis not present

## 2019-11-07 DIAGNOSIS — N1831 Chronic kidney disease, stage 3a: Secondary | ICD-10-CM | POA: Diagnosis not present

## 2019-11-07 DIAGNOSIS — Z794 Long term (current) use of insulin: Secondary | ICD-10-CM | POA: Diagnosis not present

## 2019-11-07 DIAGNOSIS — I129 Hypertensive chronic kidney disease with stage 1 through stage 4 chronic kidney disease, or unspecified chronic kidney disease: Secondary | ICD-10-CM | POA: Diagnosis not present

## 2019-11-07 DIAGNOSIS — E1165 Type 2 diabetes mellitus with hyperglycemia: Secondary | ICD-10-CM | POA: Diagnosis not present

## 2019-11-13 DIAGNOSIS — N183 Chronic kidney disease, stage 3 unspecified: Secondary | ICD-10-CM | POA: Diagnosis not present

## 2019-11-21 DIAGNOSIS — I129 Hypertensive chronic kidney disease with stage 1 through stage 4 chronic kidney disease, or unspecified chronic kidney disease: Secondary | ICD-10-CM | POA: Diagnosis not present

## 2019-11-21 DIAGNOSIS — N183 Chronic kidney disease, stage 3 unspecified: Secondary | ICD-10-CM | POA: Diagnosis not present

## 2019-11-21 DIAGNOSIS — E1122 Type 2 diabetes mellitus with diabetic chronic kidney disease: Secondary | ICD-10-CM | POA: Diagnosis not present

## 2019-11-21 DIAGNOSIS — I25119 Atherosclerotic heart disease of native coronary artery with unspecified angina pectoris: Secondary | ICD-10-CM | POA: Diagnosis not present

## 2019-11-25 ENCOUNTER — Other Ambulatory Visit: Payer: Self-pay | Admitting: Physician Assistant

## 2020-02-05 DIAGNOSIS — E039 Hypothyroidism, unspecified: Secondary | ICD-10-CM | POA: Diagnosis not present

## 2020-02-05 DIAGNOSIS — E782 Mixed hyperlipidemia: Secondary | ICD-10-CM | POA: Diagnosis not present

## 2020-02-05 DIAGNOSIS — E1165 Type 2 diabetes mellitus with hyperglycemia: Secondary | ICD-10-CM | POA: Diagnosis not present

## 2020-02-05 DIAGNOSIS — Z794 Long term (current) use of insulin: Secondary | ICD-10-CM | POA: Diagnosis not present

## 2020-02-06 DIAGNOSIS — R600 Localized edema: Secondary | ICD-10-CM | POA: Diagnosis not present

## 2020-02-06 DIAGNOSIS — I129 Hypertensive chronic kidney disease with stage 1 through stage 4 chronic kidney disease, or unspecified chronic kidney disease: Secondary | ICD-10-CM | POA: Diagnosis not present

## 2020-02-06 DIAGNOSIS — Z794 Long term (current) use of insulin: Secondary | ICD-10-CM | POA: Diagnosis not present

## 2020-02-06 DIAGNOSIS — Z951 Presence of aortocoronary bypass graft: Secondary | ICD-10-CM | POA: Diagnosis not present

## 2020-02-06 DIAGNOSIS — E669 Obesity, unspecified: Secondary | ICD-10-CM | POA: Diagnosis not present

## 2020-02-06 DIAGNOSIS — E782 Mixed hyperlipidemia: Secondary | ICD-10-CM | POA: Diagnosis not present

## 2020-02-06 DIAGNOSIS — E039 Hypothyroidism, unspecified: Secondary | ICD-10-CM | POA: Diagnosis not present

## 2020-02-06 DIAGNOSIS — I252 Old myocardial infarction: Secondary | ICD-10-CM | POA: Diagnosis not present

## 2020-02-06 DIAGNOSIS — E1122 Type 2 diabetes mellitus with diabetic chronic kidney disease: Secondary | ICD-10-CM | POA: Diagnosis not present

## 2020-02-06 DIAGNOSIS — I2510 Atherosclerotic heart disease of native coronary artery without angina pectoris: Secondary | ICD-10-CM | POA: Diagnosis not present

## 2020-02-06 DIAGNOSIS — N183 Chronic kidney disease, stage 3 unspecified: Secondary | ICD-10-CM | POA: Diagnosis not present

## 2020-02-06 DIAGNOSIS — E1165 Type 2 diabetes mellitus with hyperglycemia: Secondary | ICD-10-CM | POA: Diagnosis not present

## 2020-04-16 DIAGNOSIS — E162 Hypoglycemia, unspecified: Secondary | ICD-10-CM | POA: Diagnosis not present

## 2020-04-16 DIAGNOSIS — R404 Transient alteration of awareness: Secondary | ICD-10-CM | POA: Diagnosis not present

## 2020-04-16 DIAGNOSIS — R456 Violent behavior: Secondary | ICD-10-CM | POA: Diagnosis not present

## 2020-04-16 DIAGNOSIS — R402421 Glasgow coma scale score 9-12, in the field [EMT or ambulance]: Secondary | ICD-10-CM | POA: Diagnosis not present

## 2020-04-16 DIAGNOSIS — E161 Other hypoglycemia: Secondary | ICD-10-CM | POA: Diagnosis not present

## 2020-05-05 DIAGNOSIS — N1831 Chronic kidney disease, stage 3a: Secondary | ICD-10-CM | POA: Diagnosis not present

## 2020-05-05 DIAGNOSIS — Z794 Long term (current) use of insulin: Secondary | ICD-10-CM | POA: Diagnosis not present

## 2020-05-05 DIAGNOSIS — I10 Essential (primary) hypertension: Secondary | ICD-10-CM | POA: Diagnosis not present

## 2020-05-05 DIAGNOSIS — E782 Mixed hyperlipidemia: Secondary | ICD-10-CM | POA: Diagnosis not present

## 2020-05-05 DIAGNOSIS — E1165 Type 2 diabetes mellitus with hyperglycemia: Secondary | ICD-10-CM | POA: Diagnosis not present

## 2020-05-05 DIAGNOSIS — E039 Hypothyroidism, unspecified: Secondary | ICD-10-CM | POA: Diagnosis not present

## 2020-05-08 DIAGNOSIS — Z794 Long term (current) use of insulin: Secondary | ICD-10-CM | POA: Diagnosis not present

## 2020-05-08 DIAGNOSIS — E782 Mixed hyperlipidemia: Secondary | ICD-10-CM | POA: Diagnosis not present

## 2020-05-08 DIAGNOSIS — I129 Hypertensive chronic kidney disease with stage 1 through stage 4 chronic kidney disease, or unspecified chronic kidney disease: Secondary | ICD-10-CM | POA: Diagnosis not present

## 2020-05-08 DIAGNOSIS — E1122 Type 2 diabetes mellitus with diabetic chronic kidney disease: Secondary | ICD-10-CM | POA: Diagnosis not present

## 2020-05-08 DIAGNOSIS — E1165 Type 2 diabetes mellitus with hyperglycemia: Secondary | ICD-10-CM | POA: Diagnosis not present

## 2020-05-08 DIAGNOSIS — N1831 Chronic kidney disease, stage 3a: Secondary | ICD-10-CM | POA: Diagnosis not present

## 2020-05-08 DIAGNOSIS — E039 Hypothyroidism, unspecified: Secondary | ICD-10-CM | POA: Diagnosis not present

## 2020-05-22 ENCOUNTER — Other Ambulatory Visit: Payer: Self-pay | Admitting: Interventional Cardiology

## 2020-06-24 DIAGNOSIS — Z23 Encounter for immunization: Secondary | ICD-10-CM | POA: Diagnosis not present

## 2020-09-04 ENCOUNTER — Other Ambulatory Visit: Payer: Self-pay | Admitting: Physician Assistant

## 2020-09-27 ENCOUNTER — Other Ambulatory Visit: Payer: Self-pay | Admitting: Interventional Cardiology

## 2020-11-16 DIAGNOSIS — Z0000 Encounter for general adult medical examination without abnormal findings: Secondary | ICD-10-CM | POA: Diagnosis not present

## 2020-11-17 DIAGNOSIS — E1165 Type 2 diabetes mellitus with hyperglycemia: Secondary | ICD-10-CM | POA: Diagnosis not present

## 2020-11-17 DIAGNOSIS — N1831 Chronic kidney disease, stage 3a: Secondary | ICD-10-CM | POA: Diagnosis not present

## 2020-11-17 DIAGNOSIS — Z794 Long term (current) use of insulin: Secondary | ICD-10-CM | POA: Diagnosis not present

## 2020-11-17 DIAGNOSIS — E039 Hypothyroidism, unspecified: Secondary | ICD-10-CM | POA: Diagnosis not present

## 2020-11-18 DIAGNOSIS — E1165 Type 2 diabetes mellitus with hyperglycemia: Secondary | ICD-10-CM | POA: Diagnosis not present

## 2020-11-18 DIAGNOSIS — E1122 Type 2 diabetes mellitus with diabetic chronic kidney disease: Secondary | ICD-10-CM | POA: Diagnosis not present

## 2020-11-18 DIAGNOSIS — E782 Mixed hyperlipidemia: Secondary | ICD-10-CM | POA: Diagnosis not present

## 2020-11-18 DIAGNOSIS — Z794 Long term (current) use of insulin: Secondary | ICD-10-CM | POA: Diagnosis not present

## 2020-11-18 DIAGNOSIS — E039 Hypothyroidism, unspecified: Secondary | ICD-10-CM | POA: Diagnosis not present

## 2020-11-18 DIAGNOSIS — I129 Hypertensive chronic kidney disease with stage 1 through stage 4 chronic kidney disease, or unspecified chronic kidney disease: Secondary | ICD-10-CM | POA: Diagnosis not present

## 2020-11-18 DIAGNOSIS — N1831 Chronic kidney disease, stage 3a: Secondary | ICD-10-CM | POA: Diagnosis not present

## 2020-11-20 DIAGNOSIS — R404 Transient alteration of awareness: Secondary | ICD-10-CM | POA: Diagnosis not present

## 2020-11-20 DIAGNOSIS — R410 Disorientation, unspecified: Secondary | ICD-10-CM | POA: Diagnosis not present

## 2020-11-20 DIAGNOSIS — R0902 Hypoxemia: Secondary | ICD-10-CM | POA: Diagnosis not present

## 2020-11-20 DIAGNOSIS — E161 Other hypoglycemia: Secondary | ICD-10-CM | POA: Diagnosis not present

## 2020-11-20 DIAGNOSIS — E162 Hypoglycemia, unspecified: Secondary | ICD-10-CM | POA: Diagnosis not present

## 2020-11-23 ENCOUNTER — Inpatient Hospital Stay (HOSPITAL_COMMUNITY): Payer: Medicare Other

## 2020-11-23 ENCOUNTER — Emergency Department (HOSPITAL_COMMUNITY): Payer: Medicare Other

## 2020-11-23 ENCOUNTER — Encounter (HOSPITAL_COMMUNITY): Payer: Self-pay | Admitting: Emergency Medicine

## 2020-11-23 ENCOUNTER — Other Ambulatory Visit: Payer: Self-pay

## 2020-11-23 ENCOUNTER — Observation Stay (HOSPITAL_COMMUNITY)
Admission: EM | Admit: 2020-11-23 | Discharge: 2020-11-24 | Disposition: A | Discharge status: Home / Self Care | Payer: Medicare Other | Attending: Family Medicine | Admitting: Family Medicine

## 2020-11-23 DIAGNOSIS — E11649 Type 2 diabetes mellitus with hypoglycemia without coma: Secondary | ICD-10-CM | POA: Insufficient documentation

## 2020-11-23 DIAGNOSIS — Z87891 Personal history of nicotine dependence: Secondary | ICD-10-CM | POA: Diagnosis not present

## 2020-11-23 DIAGNOSIS — G9389 Other specified disorders of brain: Secondary | ICD-10-CM | POA: Diagnosis not present

## 2020-11-23 DIAGNOSIS — S0990XA Unspecified injury of head, initial encounter: Secondary | ICD-10-CM | POA: Diagnosis not present

## 2020-11-23 DIAGNOSIS — Z20822 Contact with and (suspected) exposure to covid-19: Secondary | ICD-10-CM | POA: Insufficient documentation

## 2020-11-23 DIAGNOSIS — Z951 Presence of aortocoronary bypass graft: Secondary | ICD-10-CM | POA: Insufficient documentation

## 2020-11-23 DIAGNOSIS — E1122 Type 2 diabetes mellitus with diabetic chronic kidney disease: Secondary | ICD-10-CM | POA: Diagnosis not present

## 2020-11-23 DIAGNOSIS — R778 Other specified abnormalities of plasma proteins: Secondary | ICD-10-CM

## 2020-11-23 DIAGNOSIS — I10 Essential (primary) hypertension: Secondary | ICD-10-CM | POA: Diagnosis present

## 2020-11-23 DIAGNOSIS — E1159 Type 2 diabetes mellitus with other circulatory complications: Secondary | ICD-10-CM

## 2020-11-23 DIAGNOSIS — Z79899 Other long term (current) drug therapy: Secondary | ICD-10-CM | POA: Diagnosis not present

## 2020-11-23 DIAGNOSIS — R404 Transient alteration of awareness: Secondary | ICD-10-CM | POA: Diagnosis not present

## 2020-11-23 DIAGNOSIS — I130 Hypertensive heart and chronic kidney disease with heart failure and stage 1 through stage 4 chronic kidney disease, or unspecified chronic kidney disease: Secondary | ICD-10-CM | POA: Insufficient documentation

## 2020-11-23 DIAGNOSIS — E538 Deficiency of other specified B group vitamins: Secondary | ICD-10-CM | POA: Insufficient documentation

## 2020-11-23 DIAGNOSIS — E559 Vitamin D deficiency, unspecified: Secondary | ICD-10-CM | POA: Diagnosis present

## 2020-11-23 DIAGNOSIS — E162 Hypoglycemia, unspecified: Secondary | ICD-10-CM | POA: Diagnosis present

## 2020-11-23 DIAGNOSIS — W19XXXA Unspecified fall, initial encounter: Secondary | ICD-10-CM | POA: Diagnosis not present

## 2020-11-23 DIAGNOSIS — R0902 Hypoxemia: Secondary | ICD-10-CM | POA: Diagnosis not present

## 2020-11-23 DIAGNOSIS — N183 Chronic kidney disease, stage 3 unspecified: Secondary | ICD-10-CM | POA: Diagnosis not present

## 2020-11-23 DIAGNOSIS — I708 Atherosclerosis of other arteries: Secondary | ICD-10-CM | POA: Diagnosis not present

## 2020-11-23 DIAGNOSIS — E119 Type 2 diabetes mellitus without complications: Secondary | ICD-10-CM

## 2020-11-23 DIAGNOSIS — E782 Mixed hyperlipidemia: Secondary | ICD-10-CM | POA: Diagnosis present

## 2020-11-23 DIAGNOSIS — R296 Repeated falls: Secondary | ICD-10-CM | POA: Diagnosis present

## 2020-11-23 DIAGNOSIS — J449 Chronic obstructive pulmonary disease, unspecified: Secondary | ICD-10-CM | POA: Diagnosis not present

## 2020-11-23 DIAGNOSIS — J441 Chronic obstructive pulmonary disease with (acute) exacerbation: Secondary | ICD-10-CM

## 2020-11-23 DIAGNOSIS — R55 Syncope and collapse: Secondary | ICD-10-CM | POA: Diagnosis not present

## 2020-11-23 DIAGNOSIS — E161 Other hypoglycemia: Secondary | ICD-10-CM | POA: Diagnosis not present

## 2020-11-23 DIAGNOSIS — E039 Hypothyroidism, unspecified: Secondary | ICD-10-CM | POA: Diagnosis present

## 2020-11-23 DIAGNOSIS — R410 Disorientation, unspecified: Secondary | ICD-10-CM | POA: Diagnosis not present

## 2020-11-23 DIAGNOSIS — I5032 Chronic diastolic (congestive) heart failure: Secondary | ICD-10-CM | POA: Diagnosis not present

## 2020-11-23 DIAGNOSIS — I2510 Atherosclerotic heart disease of native coronary artery without angina pectoris: Secondary | ICD-10-CM | POA: Diagnosis not present

## 2020-11-23 DIAGNOSIS — Z7982 Long term (current) use of aspirin: Secondary | ICD-10-CM | POA: Insufficient documentation

## 2020-11-23 DIAGNOSIS — S40029A Contusion of unspecified upper arm, initial encounter: Secondary | ICD-10-CM | POA: Diagnosis not present

## 2020-11-23 DIAGNOSIS — Z794 Long term (current) use of insulin: Secondary | ICD-10-CM

## 2020-11-23 DIAGNOSIS — I6529 Occlusion and stenosis of unspecified carotid artery: Secondary | ICD-10-CM | POA: Diagnosis not present

## 2020-11-23 DIAGNOSIS — Z043 Encounter for examination and observation following other accident: Secondary | ICD-10-CM | POA: Diagnosis not present

## 2020-11-23 DIAGNOSIS — S199XXA Unspecified injury of neck, initial encounter: Secondary | ICD-10-CM | POA: Diagnosis not present

## 2020-11-23 DIAGNOSIS — M4802 Spinal stenosis, cervical region: Secondary | ICD-10-CM | POA: Diagnosis not present

## 2020-11-23 DIAGNOSIS — I5033 Acute on chronic diastolic (congestive) heart failure: Secondary | ICD-10-CM | POA: Diagnosis present

## 2020-11-23 DIAGNOSIS — I672 Cerebral atherosclerosis: Secondary | ICD-10-CM | POA: Diagnosis not present

## 2020-11-23 DIAGNOSIS — S4990XA Unspecified injury of shoulder and upper arm, unspecified arm, initial encounter: Secondary | ICD-10-CM | POA: Diagnosis present

## 2020-11-23 LAB — URINALYSIS, ROUTINE W REFLEX MICROSCOPIC
Bacteria, UA: NONE SEEN
Bilirubin Urine: NEGATIVE
Glucose, UA: NEGATIVE mg/dL
Ketones, ur: NEGATIVE mg/dL
Leukocytes,Ua: NEGATIVE
Nitrite: NEGATIVE
Protein, ur: NEGATIVE mg/dL
Specific Gravity, Urine: 1.001 — ABNORMAL LOW (ref 1.005–1.030)
pH: 6 (ref 5.0–8.0)

## 2020-11-23 LAB — CBC WITH DIFFERENTIAL/PLATELET
Abs Immature Granulocytes: 0.18 10*3/uL — ABNORMAL HIGH (ref 0.00–0.07)
Basophils Absolute: 0 10*3/uL (ref 0.0–0.1)
Basophils Relative: 0 %
Eosinophils Absolute: 0 10*3/uL (ref 0.0–0.5)
Eosinophils Relative: 0 %
HCT: 46 % (ref 39.0–52.0)
Hemoglobin: 14.8 g/dL (ref 13.0–17.0)
Immature Granulocytes: 2 %
Lymphocytes Relative: 4 %
Lymphs Abs: 0.4 10*3/uL — ABNORMAL LOW (ref 0.7–4.0)
MCH: 30.4 pg (ref 26.0–34.0)
MCHC: 32.2 g/dL (ref 30.0–36.0)
MCV: 94.5 fL (ref 80.0–100.0)
Monocytes Absolute: 0.8 10*3/uL (ref 0.1–1.0)
Monocytes Relative: 8 %
Neutro Abs: 8.4 10*3/uL — ABNORMAL HIGH (ref 1.7–7.7)
Neutrophils Relative %: 86 %
Platelets: 178 10*3/uL (ref 150–400)
RBC: 4.87 MIL/uL (ref 4.22–5.81)
RDW: 12.5 % (ref 11.5–15.5)
WBC: 9.8 10*3/uL (ref 4.0–10.5)
nRBC: 0 % (ref 0.0–0.2)

## 2020-11-23 LAB — ECHOCARDIOGRAM COMPLETE
Height: 71 in
S' Lateral: 3.3 cm
Weight: 3936.53 oz

## 2020-11-23 LAB — I-STAT CHEM 8, ED
BUN: 41 mg/dL — ABNORMAL HIGH (ref 8–23)
Calcium, Ion: 0.96 mmol/L — ABNORMAL LOW (ref 1.15–1.40)
Chloride: 101 mmol/L (ref 98–111)
Creatinine, Ser: 1.6 mg/dL — ABNORMAL HIGH (ref 0.61–1.24)
Glucose, Bld: 164 mg/dL — ABNORMAL HIGH (ref 70–99)
HCT: 43 % (ref 39.0–52.0)
Hemoglobin: 14.6 g/dL (ref 13.0–17.0)
Potassium: 4.4 mmol/L (ref 3.5–5.1)
Sodium: 139 mmol/L (ref 135–145)
TCO2: 30 mmol/L (ref 22–32)

## 2020-11-23 LAB — HEMOGLOBIN A1C
Hgb A1c MFr Bld: 7 % — ABNORMAL HIGH (ref 4.8–5.6)
Mean Plasma Glucose: 154.2 mg/dL

## 2020-11-23 LAB — GLUCOSE, CAPILLARY: Glucose-Capillary: 265 mg/dL — ABNORMAL HIGH (ref 70–99)

## 2020-11-23 LAB — CBG MONITORING, ED
Glucose-Capillary: 139 mg/dL — ABNORMAL HIGH (ref 70–99)
Glucose-Capillary: 161 mg/dL — ABNORMAL HIGH (ref 70–99)
Glucose-Capillary: 287 mg/dL — ABNORMAL HIGH (ref 70–99)
Glucose-Capillary: 300 mg/dL — ABNORMAL HIGH (ref 70–99)
Glucose-Capillary: 320 mg/dL — ABNORMAL HIGH (ref 70–99)

## 2020-11-23 LAB — RESP PANEL BY RT-PCR (FLU A&B, COVID) ARPGX2
Influenza A by PCR: NEGATIVE
Influenza B by PCR: NEGATIVE
SARS Coronavirus 2 by RT PCR: NEGATIVE

## 2020-11-23 LAB — TROPONIN I (HIGH SENSITIVITY)
Troponin I (High Sensitivity): 30 ng/L — ABNORMAL HIGH (ref <18)
Troponin I (High Sensitivity): 40 ng/L — ABNORMAL HIGH (ref <18)

## 2020-11-23 LAB — BRAIN NATRIURETIC PEPTIDE: B Natriuretic Peptide: 11.5 pg/mL (ref 0.0–100.0)

## 2020-11-23 MED ORDER — METHYLPREDNISOLONE SODIUM SUCC 125 MG IJ SOLR: 80.0000 mg | Freq: Two times a day (BID) | INTRAMUSCULAR | Status: DC

## 2020-11-23 MED ORDER — SODIUM CHLORIDE 0.9 % IV SOLN: 250.0000 mL | INTRAVENOUS | Status: DC | PRN

## 2020-11-23 MED ORDER — ALBUTEROL SULFATE HFA 108 (90 BASE) MCG/ACT IN AERS
2.0000 | INHALATION_SPRAY | RESPIRATORY_TRACT | Status: DC | PRN
  Administered 2020-11-24: 2 via RESPIRATORY_TRACT

## 2020-11-23 MED ORDER — IPRATROPIUM-ALBUTEROL 0.5-2.5 (3) MG/3ML IN SOLN: 3.0000 mL | Freq: Four times a day (QID) | RESPIRATORY_TRACT | Status: DC | PRN

## 2020-11-23 MED ORDER — AMLODIPINE BESYLATE 5 MG PO TABS
5.0000 mg | ORAL_TABLET | Freq: Every day | ORAL | Status: DC
  Administered 2020-11-23 – 2020-11-24 (×2): 5 mg via ORAL
  Filled 2020-11-23 (×2): qty 1

## 2020-11-23 MED ORDER — AEROCHAMBER Z-STAT PLUS/MEDIUM MISC
1.0000 | Freq: Once | Status: DC
  Filled 2020-11-23: qty 1

## 2020-11-23 MED ORDER — PREDNISONE 20 MG PO TABS
40.0000 mg | ORAL_TABLET | Freq: Every day | ORAL | Status: DC
  Administered 2020-11-24: 40 mg via ORAL
  Filled 2020-11-23: qty 2

## 2020-11-23 MED ORDER — HEPARIN SODIUM (PORCINE) 5000 UNIT/ML IJ SOLN
5000.0000 [IU] | Freq: Three times a day (TID) | INTRAMUSCULAR | Status: DC
  Administered 2020-11-23 – 2020-11-24 (×4): 5000 [IU] via SUBCUTANEOUS
  Filled 2020-11-23 (×4): qty 1

## 2020-11-23 MED ORDER — METHYLPREDNISOLONE SODIUM SUCC 125 MG IJ SOLR
125.0000 mg | Freq: Once | INTRAMUSCULAR | Status: AC
  Administered 2020-11-23: 125 mg via INTRAVENOUS
  Filled 2020-11-23: qty 2

## 2020-11-23 MED ORDER — ONDANSETRON HCL 4 MG/2ML IJ SOLN: 4.0000 mg | Freq: Four times a day (QID) | INTRAMUSCULAR | Status: DC | PRN

## 2020-11-23 MED ORDER — LEVOTHYROXINE SODIUM 88 MCG PO TABS
88.0000 ug | ORAL_TABLET | Freq: Every day | ORAL | Status: DC
  Administered 2020-11-23 – 2020-11-24 (×2): 88 ug via ORAL
  Filled 2020-11-23 (×2): qty 1

## 2020-11-23 MED ORDER — ATORVASTATIN CALCIUM 20 MG PO TABS
20.0000 mg | ORAL_TABLET | Freq: Every day | ORAL | Status: DC
  Administered 2020-11-23 – 2020-11-24 (×2): 20 mg via ORAL
  Filled 2020-11-23: qty 2
  Filled 2020-11-23 (×2): qty 1

## 2020-11-23 MED ORDER — SODIUM CHLORIDE 0.9 % IV SOLN
1.0000 g | Freq: Once | INTRAVENOUS | Status: AC
  Administered 2020-11-23: 1 g via INTRAVENOUS
  Filled 2020-11-23: qty 10

## 2020-11-23 MED ORDER — UMECLIDINIUM-VILANTEROL 62.5-25 MCG/INH IN AEPB
1.0000 | INHALATION_SPRAY | Freq: Every day | RESPIRATORY_TRACT | Status: DC
  Administered 2020-11-24: 1 via RESPIRATORY_TRACT
  Filled 2020-11-23: qty 14

## 2020-11-23 MED ORDER — CYANOCOBALAMIN 1000 MCG/ML IJ SOLN
1000.0000 ug | Freq: Once | INTRAMUSCULAR | Status: AC
  Administered 2020-11-23: 1000 ug via INTRAMUSCULAR
  Filled 2020-11-23: qty 1

## 2020-11-23 MED ORDER — ATENOLOL 25 MG PO TABS
25.0000 mg | ORAL_TABLET | Freq: Every day | ORAL | Status: DC
  Administered 2020-11-23 – 2020-11-24 (×2): 25 mg via ORAL
  Filled 2020-11-23 (×2): qty 1

## 2020-11-23 MED ORDER — ACETAMINOPHEN 650 MG RE SUPP: 650.0000 mg | Freq: Four times a day (QID) | RECTAL | Status: DC | PRN

## 2020-11-23 MED ORDER — ASPIRIN EC 81 MG PO TBEC
81.0000 mg | DELAYED_RELEASE_TABLET | Freq: Every day | ORAL | Status: DC
  Administered 2020-11-23 – 2020-11-24 (×2): 81 mg via ORAL
  Filled 2020-11-23 (×2): qty 1

## 2020-11-23 MED ORDER — SODIUM CHLORIDE 0.9% FLUSH: 3.0000 mL | INTRAVENOUS | Status: DC | PRN

## 2020-11-23 MED ORDER — ACETAMINOPHEN 325 MG PO TABS
650.0000 mg | ORAL_TABLET | Freq: Four times a day (QID) | ORAL | Status: DC | PRN
  Administered 2020-11-23: 650 mg via ORAL
  Filled 2020-11-23: qty 2

## 2020-11-23 MED ORDER — NITROGLYCERIN 0.4 MG SL SUBL: 0.4000 mg | SUBLINGUAL_TABLET | SUBLINGUAL | Status: DC | PRN

## 2020-11-23 MED ORDER — INSULIN ASPART 100 UNIT/ML IJ SOLN
0.0000 [IU] | Freq: Three times a day (TID) | INTRAMUSCULAR | Status: DC
  Administered 2020-11-23: 11 [IU] via SUBCUTANEOUS
  Administered 2020-11-23 (×2): 8 [IU] via SUBCUTANEOUS
  Administered 2020-11-24: 2 [IU] via SUBCUTANEOUS
  Administered 2020-11-24: 3 [IU] via SUBCUTANEOUS
  Filled 2020-11-23: qty 0.15

## 2020-11-23 MED ORDER — ALBUTEROL SULFATE (2.5 MG/3ML) 0.083% IN NEBU
5.0000 mg | INHALATION_SOLUTION | Freq: Once | RESPIRATORY_TRACT | Status: AC
  Administered 2020-11-23: 5 mg via RESPIRATORY_TRACT
  Filled 2020-11-23: qty 6

## 2020-11-23 MED ORDER — IPRATROPIUM-ALBUTEROL 0.5-2.5 (3) MG/3ML IN SOLN
3.0000 mL | Freq: Once | RESPIRATORY_TRACT | Status: AC
  Administered 2020-11-23: 3 mL via RESPIRATORY_TRACT
  Filled 2020-11-23: qty 3

## 2020-11-23 MED ORDER — INSULIN DETEMIR 100 UNIT/ML ~~LOC~~ SOLN
15.0000 [IU] | Freq: Every day | SUBCUTANEOUS | Status: DC
  Administered 2020-11-23 – 2020-11-24 (×2): 15 [IU] via SUBCUTANEOUS
  Filled 2020-11-23 (×2): qty 0.15

## 2020-11-23 MED ORDER — ALBUTEROL SULFATE HFA 108 (90 BASE) MCG/ACT IN AERS: 6.0000 | INHALATION_SPRAY | Freq: Once | RESPIRATORY_TRACT | Status: DC

## 2020-11-23 MED ORDER — POLYVINYL ALCOHOL 1.4 % OP SOLN
1.0000 [drp] | OPHTHALMIC | Status: DC | PRN
  Filled 2020-11-23: qty 15

## 2020-11-23 MED ORDER — SODIUM CHLORIDE 0.9% FLUSH
3.0000 mL | Freq: Two times a day (BID) | INTRAVENOUS | Status: DC
  Administered 2020-11-23 – 2020-11-24 (×3): 3 mL via INTRAVENOUS

## 2020-11-23 MED ORDER — VITAMIN D 25 MCG (1000 UNIT) PO TABS
5000.0000 [IU] | ORAL_TABLET | Freq: Every day | ORAL | Status: DC
  Administered 2020-11-23 – 2020-11-24 (×2): 5000 [IU] via ORAL
  Filled 2020-11-23 (×2): qty 5

## 2020-11-23 MED ORDER — SODIUM CHLORIDE 0.9 % IV SOLN: INTRAVENOUS | Status: DC

## 2020-11-23 MED ORDER — ONDANSETRON HCL 4 MG PO TABS: 4.0000 mg | ORAL_TABLET | Freq: Four times a day (QID) | ORAL | Status: DC | PRN

## 2020-11-23 NOTE — ED Triage Notes (Signed)
Pt arrived from home via EMS. Pt has hx of diabetes. Pt had an unwitnessed fall at home and hit his head. Pt's blood sugar was 52 for EMS when they arrived on scene. Pt was given orange juice and peanut butter, and his repeat sugar was 101. Pt reported to EMS that he took too much insulin for his evening dose.  Pt has fallen several times in the past 4-5 days, which is unusual for him.

## 2020-11-23 NOTE — Progress Notes (Signed)
Carotid artery duplex has been completed. Preliminary results can be found in [facility] through chart review.   11/23/20 10:00 AM Carlos Levering RVT

## 2020-11-23 NOTE — ED Notes (Signed)
Pt ambulated to and from bathroom with minimal assistance

## 2020-11-23 NOTE — ED Notes (Signed)
ED TO INPATIENT HANDOFF REPORT  Name/Age/Gender Luke [name] 87 y.o. male  Code Status    Code Status Orders  (From admission, onward)         Start     Ordered   11/23/20 0658  Full code  Continuous        11/23/20 0659        Code Status History    Date Active Date Inactive Code Status Order ID Comments User Context   03/17/2013 1630 03/20/2013 1738 Full Code [id number]  Geradine Girt, DO Inpatient   Advance Care Planning Activity      Home/SNF/Other Home  Chief Complaint Hypoglycemia [E16.2]  Level of Care/Admitting Diagnosis ED Disposition    ED Disposition Condition [facility] Area: [facility] [[id number]]  Level of Care: Telemetry [5]  Admit to tele based on following criteria: Eval of Syncope  May admit patient to [facility] or [facility] if equivalent level of care is available:: Yes  Covid Evaluation: Confirmed COVID Negative  Diagnosis: Hypoglycemia [353299]  Admitting Physician: Cherylann Ratel  Attending Physician: Cherylann Ratel  Estimated length of stay: 3 - 4 days  Certification:: I certify this patient will need inpatient services for at least 2 midnights       Medical History Past Medical History:  Diagnosis Date  . Anginal pain ([place]) 02/08/12  . Arthritis    "all over"  . CKD (chronic kidney disease), stage III ([place])   . COPD (chronic obstructive pulmonary disease) ([place])   . Coronary artery disease    a. MI/CABG in 9/99 at [facility], [place] with (RIMA to LAD, LIMA to OM, SVG to PDA. b. LAD drug eluting stent in 3/09 through the graft to the distal LAD.  Marland Kitchen Exertional dyspnea    "doesn't take much these days"  . H/O hiatal hernia   . Hyperlipemia   . Hypertension   . Hypothyroidism   . Hypoxia   . Stroke [facility]) 09/2010   denies residual  . Type II diabetes mellitus ([place])     Allergies No Known Allergies  IV Location/Drains/Wounds Patient Lines/Drains/Airways Status     Active Line/Drains/Airways    Name Placement date Placement time Site Days   Peripheral IV 11/23/20 Anterior;Right Hand 11/23/20  0421  Hand  less than 1          Labs/Imaging Results for orders placed or performed during the hospital encounter of 11/23/20 (from the past 48 hour(s))  POC CBG, ED     Status: Abnormal   Collection Time: 11/23/20  3:01 AM  Result Value Ref Range   Glucose-Capillary 139 (H) 70 - 99 mg/dL    Comment: Glucose reference range applies only to samples taken after fasting for at least 8 hours.  I-stat chem 8, ED (not at [facility] or [facility])     Status: Abnormal   Collection Time: 11/23/20  3:22 AM  Result Value Ref Range   Sodium 139 135 - 145 mmol/L   Potassium 4.4 3.5 - 5.1 mmol/L   Chloride 101 98 - 111 mmol/L   BUN 41 (H) 8 - 23 mg/dL   Creatinine, Ser 1.60 (H) 0.61 - 1.24 mg/dL   Glucose, Bld 164 (H) 70 - 99 mg/dL    Comment: Glucose reference range applies only to samples taken after fasting for at least 8 hours.   Calcium, Ion 0.96 (L) 1.15 - 1.40 mmol/L   TCO2 30 22 -  32 mmol/L   Hemoglobin 14.6 13.0 - 17.0 g/dL   HCT 43.0 39.0 - 52.0 %  CBC with Differential/Platelet     Status: Abnormal   Collection Time: 11/23/20  3:40 AM  Result Value Ref Range   WBC 9.8 4.0 - 10.5 K/uL   RBC 4.87 4.22 - 5.81 MIL/uL   Hemoglobin 14.8 13.0 - 17.0 g/dL   HCT 46.0 39.0 - 52.0 %   MCV 94.5 80.0 - 100.0 fL   MCH 30.4 26.0 - 34.0 pg   MCHC 32.2 30.0 - 36.0 g/dL   RDW 12.5 11.5 - 15.5 %   Platelets 178 150 - 400 K/uL   nRBC 0.0 0.0 - 0.2 %   Neutrophils Relative % 86 %   Neutro Abs 8.4 (H) 1.7 - 7.7 K/uL   Lymphocytes Relative 4 %   Lymphs Abs 0.4 (L) 0.7 - 4.0 K/uL   Monocytes Relative 8 %   Monocytes Absolute 0.8 0.1 - 1.0 K/uL   Eosinophils Relative 0 %   Eosinophils Absolute 0.0 0.0 - 0.5 K/uL   Basophils Relative 0 %   Basophils Absolute 0.0 0.0 - 0.1 K/uL   Immature Granulocytes 2 %   Abs Immature Granulocytes 0.18 (H) 0.00 - 0.07 K/uL    Comment:  Performed at [facility], [place] [address] [place], [place] [address]  Troponin I (High Sensitivity)     Status: Abnormal   Collection Time: 11/23/20  3:40 AM  Result Value Ref Range   Troponin I (High Sensitivity) 30 (H) <18 ng/L    Comment: (NOTE) Elevated high sensitivity troponin I (hsTnI) values and significant  changes across serial measurements may suggest ACS but many other  chronic and acute conditions are known to elevate hsTnI results.  Refer to the "Links" section for chest pain algorithms and additional  guidance. Performed at [facility], [place] [address] [place], [place] [address]   Brain natriuretic peptide     Status: None   Collection Time: 11/23/20  3:40 AM  Result Value Ref Range   B Natriuretic Peptide 11.5 0.0 - 100.0 pg/mL    Comment: Performed at [facility], [place] [address] [place], [place] [address]  Urinalysis, Routine w reflex microscopic Nasopharyngeal Swab     Status: Abnormal   Collection Time: 11/23/20  4:32 AM  Result Value Ref Range   Color, Urine COLORLESS (A) YELLOW   APPearance CLEAR CLEAR   Specific Gravity, Urine 1.001 (L) 1.005 - 1.030   pH 6.0 5.0 - 8.0   Glucose, UA NEGATIVE NEGATIVE mg/dL   Hgb urine dipstick SMALL (A) NEGATIVE   Bilirubin Urine NEGATIVE NEGATIVE   Ketones, ur NEGATIVE NEGATIVE mg/dL   Protein, ur NEGATIVE NEGATIVE mg/dL   Nitrite NEGATIVE NEGATIVE   Leukocytes,Ua NEGATIVE NEGATIVE   Bacteria, UA NONE SEEN NONE SEEN    Comment: Performed at [place] [address], [place], [place] [address]  CBG monitoring, ED     Status: Abnormal   Collection Time: 11/23/20  4:37 AM  Result Value Ref Range   Glucose-Capillary 161 (H) 70 - 99 mg/dL    Comment: Glucose reference range applies only to samples taken after fasting for at least 8 hours.  Resp Panel by RT-PCR (Flu A&B, Covid) Nasopharyngeal Swab     Status: None   Collection Time: 11/23/20   4:50 AM   Specimen: Nasopharyngeal Swab; Nasopharyngeal(NP) swabs in vial transport medium  Result Value Ref Range  SARS Coronavirus 2 by RT PCR NEGATIVE NEGATIVE    Comment: (NOTE) SARS-CoV-2 target nucleic acids are NOT DETECTED.  The SARS-CoV-2 RNA is generally detectable in upper respiratory specimens during the acute phase of infection. The lowest concentration of SARS-CoV-2 viral copies this assay can detect is 138 copies/mL. A negative result does not preclude SARS-Cov-2 infection and should not be used as the sole basis for treatment or other patient management decisions. A negative result may occur with  improper specimen collection/handling, submission of specimen other than nasopharyngeal swab, presence of viral mutation(s) within the areas targeted by this assay, and inadequate number of viral copies(<138 copies/mL). A negative result must be combined with clinical observations, patient history, and epidemiological information. The expected result is Negative.  Fact Sheet for Patients:  EntrepreneurPulse.com.au  Fact Sheet for Healthcare Providers:  IncredibleEmployment.be  This test is no t yet approved or cleared by the [place] FDA and  has been authorized for detection and/or diagnosis of SARS-CoV-2 by FDA under an Emergency Use Authorization (EUA). This EUA will remain  in effect (meaning this test can be used) for the duration of the COVID-19 declaration under Section 564(b)(1) of the Act, 21 U.S.C.section 360bbb-3(b)(1), unless the authorization is terminated  or revoked sooner.       Influenza A by PCR NEGATIVE NEGATIVE   Influenza B by PCR NEGATIVE NEGATIVE    Comment: (NOTE) The Xpert Xpress SARS-CoV-2/FLU/RSV plus assay is intended as an aid in the diagnosis of influenza from Nasopharyngeal swab specimens and should not be used as a sole basis for treatment. Nasal washings and aspirates are unacceptable for  Xpert Xpress SARS-CoV-2/FLU/RSV testing.  Fact Sheet for Patients: EntrepreneurPulse.com.au  Fact Sheet for Healthcare Providers: IncredibleEmployment.be  This test is not yet approved or cleared by the [place] FDA and has been authorized for detection and/or diagnosis of SARS-CoV-2 by FDA under an Emergency Use Authorization (EUA). This EUA will remain in effect (meaning this test can be used) for the duration of the COVID-19 declaration under Section 564(b)(1) of the Act, 21 U.S.C. section 360bbb-3(b)(1), unless the authorization is terminated or revoked.  Performed at [facility], [place] [address], [place], [place] [address]   Troponin I (High Sensitivity)     Status: Abnormal   Collection Time: 11/23/20  4:55 AM  Result Value Ref Range   Troponin I (High Sensitivity) 40 (H) <18 ng/L    Comment: (NOTE) Elevated high sensitivity troponin I (hsTnI) values and significant  changes across serial measurements may suggest ACS but many other  chronic and acute conditions are known to elevate hsTnI results.  Refer to the "Links" section for chest pain algorithms and additional  guidance. Performed at [facility], [place] [address], [place], [place] [address]   CBG monitoring, ED     Status: Abnormal   Collection Time: 11/23/20  8:35 AM  Result Value Ref Range   Glucose-Capillary 287 (H) 70 - 99 mg/dL    Comment: Glucose reference range applies only to samples taken after fasting for at least 8 hours.  Hemoglobin A1c     Status: Abnormal   Collection Time: 11/23/20 10:24 AM  Result Value Ref Range   Hgb A1c MFr Bld 7.0 (H) 4.8 - 5.6 %    Comment: (NOTE) Pre diabetes:          5.7%-6.4%  Diabetes:              >6.4%  Glycemic control for   <  7.0% adults with diabetes    Mean Plasma Glucose 154.2 mg/dL    Comment: Performed at [facility], [place] [address], [place], [place] [address]  CBG  monitoring, ED     Status: Abnormal   Collection Time: 11/23/20 12:10 PM  Result Value Ref Range   Glucose-Capillary 300 (H) 70 - 99 mg/dL    Comment: Glucose reference range applies only to samples taken after fasting for at least 8 hours.  CBG monitoring, ED     Status: Abnormal   Collection Time: 11/23/20  4:34 PM  Result Value Ref Range   Glucose-Capillary 320 (H) 70 - 99 mg/dL    Comment: Glucose reference range applies only to samples taken after fasting for at least 8 hours.   CT Head Wo Contrast  Result Date: 11/23/2020 CLINICAL DATA:  Unwitnessed fall with head strike EXAM: CT HEAD WITHOUT CONTRAST CT CERVICAL SPINE WITHOUT CONTRAST TECHNIQUE: Multidetector CT imaging of the head and cervical spine was performed following the standard protocol without intravenous contrast. Multiplanar CT image reconstructions of the cervical spine were also generated. COMPARISON:  CT head 09/21/2009 FINDINGS: CT HEAD FINDINGS Brain: Motion degraded imaging. No evidence of acute infarction, hemorrhage, hydrocephalus, extra-axial collection, visible mass lesion or mass effect. Symmetric prominence of the ventricles, cisterns and sulci compatible with parenchymal volume loss. Patchy areas of white matter hypoattenuation are most compatible with chronic microvascular angiopathy. Scattered dural calcifications and a general distribution similar to comparison CT. Vascular: Atherosclerotic calcification of the carotid siphons and intradural vertebral arteries. No hyperdense vessel. Skull: Some right occipital scalp stranding appears chronic and may be related to prior scarring. No new acute scalp swelling or hematoma. No visible calvarial fracture within the limitations of this motion degraded exam. No visible facial bone fracture or other acute osseous injury within the margins of imaging. Sinuses/Orbits: Mural thickening in the maxillary sinus with some mild atelectatic changes of the sinus itself. Likely reflecting  chronicity. No layering air-fluid levels or pneumatized secretions. Mastoid air cells and middle ear cavities are clear. Other: Edentulous with mandibular prognathism seen on scout view. CT CERVICAL SPINE FINDINGS Alignment: Slightly motion degraded imaging despite multiple attempts at acquisition. Straightening and slight reversal the normal cervical lordosis centered at the C5-6 level. Slight levocurvature of the cervical spine as well centered C4-5. No evidence of traumatic listhesis. No abnormally widened, perched or jumped facets. Bony fusion across the C2-C4 posterior facets. Normal alignment of the craniocervical and atlantoaxial articulations. Skull base and vertebrae: Bony fusion across the C2-C4 posterior articular facets, as described above. No discernible acute skull base or vertebral body fracture is seen. Multilevel cervical spondylitic changes as detailed below. Additional arthrosis at the atlantodental and basion dens intervals Soft tissues and spinal canal: No pre or paravertebral fluid or swelling. No visible canal hematoma. Airways patent. Disc levels: Multilevel intervertebral disc height loss with spondylitic endplate changes. Posterior disc osteophyte complexes are most pronounced C5-6, C6-7 with some mild resulting canal stenosis. Additional multilevel uncinate spurring and facet hypertrophic changes are present as well resulting in mild-to-moderate multilevel neural foraminal narrowing. Upper chest: No acute abnormality in the upper chest or imaged lung apices. Cervical carotid atherosclerosis. Other: No concerning thyroid nodules or masses. IMPRESSION: 1. Motion degradation of the imaging despite multiple attempts at acquisition. 2. No acute intracranial abnormality. No acute scalp swelling, hematoma or calvarial fracture. 3. Background of parenchymal volume loss, microvascular angiopathy, and intracranial atherosclerosis. 4. No acute cervical spine fracture or traumatic listhesis. 5.  Multilevel cervical spondylitic changes as detailed above. Electronically Signed   By: Lovena Le M.D.   On: 11/23/2020 04:22   CT Cervical Spine Wo Contrast  Result Date: 11/23/2020 CLINICAL DATA:  Unwitnessed fall with head strike EXAM: CT HEAD WITHOUT CONTRAST CT CERVICAL SPINE WITHOUT CONTRAST TECHNIQUE: Multidetector CT imaging of the head and cervical spine was performed following the standard protocol without intravenous contrast. Multiplanar CT image reconstructions of the cervical spine were also generated. COMPARISON:  CT head 09/21/2009 FINDINGS: CT HEAD FINDINGS Brain: Motion degraded imaging. No evidence of acute infarction, hemorrhage, hydrocephalus, extra-axial collection, visible mass lesion or mass effect. Symmetric prominence of the ventricles, cisterns and sulci compatible with parenchymal volume loss. Patchy areas of white matter hypoattenuation are most compatible with chronic microvascular angiopathy. Scattered dural calcifications and a general distribution similar to comparison CT. Vascular: Atherosclerotic calcification of the carotid siphons and intradural vertebral arteries. No hyperdense vessel. Skull: Some right occipital scalp stranding appears chronic and may be related to prior scarring. No new acute scalp swelling or hematoma. No visible calvarial fracture within the limitations of this motion degraded exam. No visible facial bone fracture or other acute osseous injury within the margins of imaging. Sinuses/Orbits: Mural thickening in the maxillary sinus with some mild atelectatic changes of the sinus itself. Likely reflecting chronicity. No layering air-fluid levels or pneumatized secretions. Mastoid air cells and middle ear cavities are clear. Other: Edentulous with mandibular prognathism seen on scout view. CT CERVICAL SPINE FINDINGS Alignment: Slightly motion degraded imaging despite multiple attempts at acquisition. Straightening and slight reversal the normal cervical  lordosis centered at the C5-6 level. Slight levocurvature of the cervical spine as well centered C4-5. No evidence of traumatic listhesis. No abnormally widened, perched or jumped facets. Bony fusion across the C2-C4 posterior facets. Normal alignment of the craniocervical and atlantoaxial articulations. Skull base and vertebrae: Bony fusion across the C2-C4 posterior articular facets, as described above. No discernible acute skull base or vertebral body fracture is seen. Multilevel cervical spondylitic changes as detailed below. Additional arthrosis at the atlantodental and basion dens intervals Soft tissues and spinal canal: No pre or paravertebral fluid or swelling. No visible canal hematoma. Airways patent. Disc levels: Multilevel intervertebral disc height loss with spondylitic endplate changes. Posterior disc osteophyte complexes are most pronounced C5-6, C6-7 with some mild resulting canal stenosis. Additional multilevel uncinate spurring and facet hypertrophic changes are present as well resulting in mild-to-moderate multilevel neural foraminal narrowing. Upper chest: No acute abnormality in the upper chest or imaged lung apices. Cervical carotid atherosclerosis. Other: No concerning thyroid nodules or masses. IMPRESSION: 1. Motion degradation of the imaging despite multiple attempts at acquisition. 2. No acute intracranial abnormality. No acute scalp swelling, hematoma or calvarial fracture. 3. Background of parenchymal volume loss, microvascular angiopathy, and intracranial atherosclerosis. 4. No acute cervical spine fracture or traumatic listhesis. 5. Multilevel cervical spondylitic changes as detailed above. Electronically Signed   By: Lovena Le M.D.   On: 11/23/2020 04:22   DG Chest Portable 1 View  Result Date: 11/23/2020 CLINICAL DATA:  Status post fall. EXAM: PORTABLE CHEST 1 VIEW COMPARISON:  August 30, 2017 FINDINGS: Multiple sternal wires and vascular clips are seen. There is no evidence of  acute infiltrate, pleural effusion or pneumothorax. The heart size and mediastinal contours are within normal limits. The visualized skeletal structures are unremarkable. IMPRESSION: No active cardiopulmonary disease. Electronically Signed   By: Virgina Norfolk M.D.   On: 11/23/2020 03:41   ECHOCARDIOGRAM COMPLETE  Result Date: 11/23/2020    ECHOCARDIOGRAM REPORT   Patient Name:   Luke [name] Date of Exam: 11/23/2020 Medical Rec #:  [id number]      Height:       71.0 in Accession #:    [id number]     Weight:       246.0 lb Date of Birth:  [birth year]      BSA:          2.303 m Patient Age:    6 years       BP:           158/57 mmHg Patient Gender: M              HR:           93 bpm. Exam Location:  Inpatient Procedure: 2D Echo, Color Doppler and Cardiac Doppler Indications:    R55 Syncope  History:        Patient has prior history of Echocardiogram examinations, most                 recent 12/05/2017. CAD, Stroke and COPD, Signs/Symptoms:Syncope;                 Risk Factors:Diabetes, Hypertension and Dyslipidemia.  Sonographer:    Bernadene Person RDCS Referring Phys: [place]  1. Left ventricular ejection fraction, by estimation, is 55 to 60%. The left ventricle has normal function. Difficult images for wall motion, possible septal hypokinesis. Contrast was not used but may have been helpful. There is mild left ventricular hypertrophy. Left ventricular diastolic parameters are consistent with Grade I diastolic dysfunction (impaired relaxation).  2. Right ventricular systolic function is normal. The right ventricular size is normal. Tricuspid regurgitation signal is inadequate for assessing PA pressure.  3. The mitral valve is normal in structure. Trivial mitral valve regurgitation. No evidence of mitral stenosis.  4. The aortic valve is tricuspid. Aortic valve regurgitation is not visualized. Mild aortic valve sclerosis is present, with no evidence of aortic valve stenosis.  5. Aortic  dilatation noted. There is mild dilatation of the aortic root, measuring 37 mm.  6. IVC not well-visualized. FINDINGS  Left Ventricle: Left ventricular ejection fraction, by estimation, is 55 to 60%. The left ventricle has normal function. The left ventricle demonstrates regional wall motion abnormalities. The left ventricular internal cavity size was normal in size. There is mild left ventricular hypertrophy. Left ventricular diastolic parameters are consistent with Grade I diastolic dysfunction (impaired relaxation). Right Ventricle: The right ventricular size is normal. No increase in right ventricular wall thickness. Right ventricular systolic function is normal. Tricuspid regurgitation signal is inadequate for assessing PA pressure. Left Atrium: Left atrial size was normal in size. Right Atrium: Right atrial size was normal in size. Pericardium: There is no evidence of pericardial effusion. Mitral Valve: The mitral valve is normal in structure. Mild mitral annular calcification. Trivial mitral valve regurgitation. No evidence of mitral valve stenosis. Tricuspid Valve: The tricuspid valve is normal in structure. Tricuspid valve regurgitation is not demonstrated. Aortic Valve: The aortic valve is tricuspid. Aortic valve regurgitation is not visualized. Mild aortic valve sclerosis is present, with no evidence of aortic valve stenosis. Pulmonic Valve: The pulmonic valve was not well visualized. Pulmonic valve regurgitation is not visualized. Aorta: Aortic dilatation noted. There is mild dilatation of the aortic root, measuring 37 mm. Venous: The inferior vena cava was not well visualized. IAS/Shunts: No atrial level shunt detected by color flow Doppler.  LEFT  VENTRICLE PLAX 2D LVIDd:         4.20 cm LVIDs:         3.30 cm LV PW:         1.10 cm LV IVS:        1.30 cm LVOT diam:     2.10 cm LV SV:         81 LV SV Index:   35 LVOT Area:     3.46 cm  RIGHT VENTRICLE TAPSE (M-mode): 1.6 cm LEFT ATRIUM              Index       RIGHT ATRIUM           Index LA diam:        3.20 cm 1.39 cm/m  RA Area:     15.40 cm LA Vol (A2C):   44.9 ml 19.50 ml/m RA Volume:   35.60 ml  15.46 ml/m LA Vol (A4C):   51.0 ml 22.15 ml/m LA Biplane Vol: 49.8 ml 21.63 ml/m  AORTIC VALVE LVOT Vmax:   117.67 cm/s LVOT Vmean:  83.067 cm/s LVOT VTI:    0.234 m  AORTA Ao Root diam: 3.70 cm Ao Asc diam:  3.40 cm  SHUNTS Systemic VTI:  0.23 m Systemic Diam: 2.10 cm Loralie Champagne MD Electronically signed by Loralie Champagne MD Signature Date/Time: 11/23/2020/1:16:17 PM    Final    VAS US CAROTID  Result Date: 11/23/2020 Carotid Arterial Duplex Study Patient Name:  Luke [name]  Date of Exam:   11/23/2020 Medical Rec #: [id number]       Accession #:    [id number] Date of Birth: April 22, 1933       Patient Gender: M Patient Age:   87Y Exam Location:  [facility] Procedure:      VAS US CAROTID Referring Phys: [id number] EKTA V PATEL --------------------------------------------------------------------------------  Indications:       Syncope. Risk Factors:      Hypertension, hyperlipidemia, Diabetes. Limitations        Today's exam was limited due to the body habitus of the                    patient and the patient's respiratory variation. Comparison Study:  No prior studies. Performing Technologist: Oliver Hum RVT  Examination Guidelines: A complete evaluation includes B-mode imaging, spectral Doppler, color Doppler, and power Doppler as needed of all accessible portions of each vessel. Bilateral testing is considered an integral part of a complete examination. Limited examinations for reoccurring indications may be performed as noted.  Right Carotid Findings: +----------+--------+--------+--------+-----------------------+--------+           PSV cm/sEDV cm/sStenosisPlaque Description     Comments +----------+--------+--------+--------+-----------------------+--------+ CCA Prox  61      10              smooth and heterogenous          +----------+--------+--------+--------+-----------------------+--------+ CCA Distal69      11              smooth and heterogenous         +----------+--------+--------+--------+-----------------------+--------+ ICA Prox  215     46              smooth and heterogenous         +----------+--------+--------+--------+-----------------------+--------+ ICA Distal110     21  tortuous +----------+--------+--------+--------+-----------------------+--------+ ECA       147     10                                              +----------+--------+--------+--------+-----------------------+--------+ +----------+--------+-------+--------+-------------------+           PSV cm/sEDV cmsDescribeArm Pressure (mmHG) +----------+--------+-------+--------+-------------------+ Subclavian230                                        +----------+--------+-------+--------+-------------------+ +---------+--------+--+--------+--+---------+ VertebralPSV cm/s56EDV cm/s11Antegrade +---------+--------+--+--------+--+---------+  Left Carotid Findings: +----------+--------+--------+--------+--------------------------+--------+           PSV cm/sEDV cm/sStenosisPlaque Description        Comments +----------+--------+--------+--------+--------------------------+--------+ CCA Prox  90      17              heterogenous and irregular         +----------+--------+--------+--------+--------------------------+--------+ CCA Distal92      13              irregular and heterogenous         +----------+--------+--------+--------+--------------------------+--------+ ICA Prox  90      18              smooth and heterogenous            +----------+--------+--------+--------+--------------------------+--------+ ICA Distal104     29                                        tortuous +----------+--------+--------+--------+--------------------------+--------+  ECA       108     12                                                 +----------+--------+--------+--------+--------------------------+--------+ +----------+--------+--------+--------+-------------------+           PSV cm/sEDV cm/sDescribeArm Pressure (mmHG) +----------+--------+--------+--------+-------------------+ [id number]                                         +----------+--------+--------+--------+-------------------+ +---------+--------+--+--------+-+---------+ VertebralPSV cm/s33EDV cm/s8Antegrade +---------+--------+--+--------+-+---------+   Summary: Right Carotid: Velocities in the right ICA are consistent with a 40-59%                stenosis. Left Carotid: Velocities in the left ICA are consistent with a 1-39% stenosis. Vertebrals: Bilateral vertebral arteries demonstrate antegrade flow. *See table(s) above for measurements and observations.  Electronically signed by Ruta Hinds MD on 11/23/2020 at 2:29:30 PM.    Final     Pending Labs Unresulted Labs (From admission, onward)          Start     Ordered   11/24/20 0500  Comprehensive metabolic panel  Tomorrow morning,   R        11/23/20 0659          Vitals/Pain Today's Vitals   11/23/20 1218 11/23/20 1500 11/23/20 1700 11/23/20 1800  BP: 136/84 (!) 132/59 (!) 146/98 130/64  Pulse: 95 86 (!) 107 96  Resp: (!) 30 (!) 28  (!)  27  Temp: 97.8 F (36.6 C)     TempSrc: Oral     SpO2: (!) 89% 94% 93% 92%  Weight:      Height:      PainSc:        Isolation Precautions No active isolations  Medications Medications  aerochamber Z-Stat Plus/medium 1 each (1 each Other Not Given 11/23/20 0459)  albuterol (VENTOLIN HFA) 108 (90 Base) MCG/ACT inhaler 2 puff (has no administration in time range)  aspirin EC tablet 81 mg (81 mg Oral Given 11/23/20 0841)  amLODipine (NORVASC) tablet 5 mg (5 mg Oral Given 11/23/20 0841)  atenolol (TENORMIN) tablet 25 mg (25 mg Oral Given 11/23/20 0839)  atorvastatin  (LIPITOR) tablet 20 mg (20 mg Oral Given 11/23/20 0840)  umeclidinium-vilanterol (ANORO ELLIPTA) 62.5-25 MCG/INH 1 puff (0 puffs Inhalation Hold 11/23/20 0940)  polyvinyl alcohol (LIQUIFILM TEARS) 1.4 % ophthalmic solution 1 drop (has no administration in time range)  nitroGLYCERIN (NITROSTAT) SL tablet 0.4 mg (has no administration in time range)  insulin aspart (novoLOG) injection 0-15 Units (11 Units Subcutaneous Given 11/23/20 1638)  heparin injection 5,000 Units (5,000 Units Subcutaneous Given 11/23/20 1223)  sodium chloride flush (NS) 0.9 % injection 3 mL (3 mLs Intravenous Given 11/23/20 0940)  sodium chloride flush (NS) 0.9 % injection 3 mL (has no administration in time range)  0.9 %  sodium chloride infusion (has no administration in time range)  acetaminophen (TYLENOL) tablet 650 mg (650 mg Oral Given 11/23/20 0841)    Or  acetaminophen (TYLENOL) suppository 650 mg ( Rectal See Alternative 11/23/20 0841)  ondansetron (ZOFRAN) tablet 4 mg (has no administration in time range)    Or  ondansetron (ZOFRAN) injection 4 mg (has no administration in time range)  ipratropium-albuterol (DUONEB) 0.5-2.5 (3) MG/3ML nebulizer solution 3 mL (has no administration in time range)  cholecalciferol (VITAMIN D3) tablet 5,000 Units (5,000 Units Oral Given 11/23/20 0841)  levothyroxine (SYNTHROID) tablet 88 mcg (88 mcg Oral Given 11/23/20 0840)  predniSONE (DELTASONE) tablet 40 mg (has no administration in time range)  insulin detemir (LEVEMIR) injection 15 Units (15 Units Subcutaneous Given 11/23/20 1515)  methylPREDNISolone sodium succinate (SOLU-MEDROL) 125 mg/2 mL injection 125 mg (125 mg Intravenous Given 11/23/20 0451)  ipratropium-albuterol (DUONEB) 0.5-2.5 (3) MG/3ML nebulizer solution 3 mL (3 mLs Nebulization Given 11/23/20 0451)  cefTRIAXone (ROCEPHIN) 1 g in sodium chloride 0.9 % 100 mL IVPB (0 g Intravenous Stopped 11/23/20 0651)  albuterol (PROVENTIL) (2.5 MG/3ML) 0.083% nebulizer solution 5 mg (5 mg Nebulization  Given 11/23/20 0622)  cyanocobalamin ((VITAMIN B-12)) injection 1,000 mcg (1,000 mcg Intramuscular Given 11/23/20 0936)    Mobility walks

## 2020-11-23 NOTE — ED Notes (Signed)
Pt given breakfast tray

## 2020-11-23 NOTE — ED Provider Notes (Signed)
[facility] DEPT Provider Note   CSN: [id number] Arrival date & time: 11/23/20  0229     History Chief Complaint  Patient presents with  . Fall  . Hypoglycemia    Luke [name] is a 87 y.o. male.  The history is provided by the EMS personnel.  Hypoglycemia Initial blood sugar:  52  Severity:  Moderate Onset quality:  Sudden Timing:  Constant Progression:  Partially resolved Chronicity:  New Diabetic status:  Controlled with insulin Context: not exercise   Relieved by:  Nothing Ineffective treatments:  None tried Associated symptoms: no speech difficulty, no tremors, no visual change and no vomiting   Associated symptoms comment:  Frequent falls at home  Risk factors: no recent surgery and no uncontrolled diabetes   Patient with DM presents with hypoglycemia, frequent falls and SOB.       Past Medical History:  Diagnosis Date  . Anginal pain ([place]) 02/08/12  . Arthritis    "all over"  . CKD (chronic kidney disease), stage III ([place])   . COPD (chronic obstructive pulmonary disease) ([place])   . Coronary artery disease    a. MI/CABG in 9/99 at [facility], [place] with (RIMA to LAD, LIMA to OM, SVG to PDA. b. LAD drug eluting stent in 3/09 through the graft to the distal LAD.  Marland Kitchen Exertional dyspnea    "doesn't take much these days"  . H/O hiatal hernia   . Hyperlipemia   . Hypertension   . Hypothyroidism   . Hypoxia   . Stroke [facility]) 09/2010   denies residual  . Type II diabetes mellitus [facility])     Patient Active Problem List   Diagnosis Date Noted  . COPD (chronic obstructive pulmonary disease) ([place]) 09/18/2017  . Hypoxemia 09/18/2017  . CRI (chronic renal insufficiency) 06/09/2014  . Overweight 06/09/2014  . Coronary atherosclerosis of native coronary artery 12/06/2013  . Mixed hyperlipidemia 12/06/2013  . Nonspecific abnormal results of cardiovascular function study 12/06/2013  . Chronic diastolic heart failure ([place])  12/06/2013  . Essential hypertension, benign 12/06/2013  . AKI (acute kidney injury) ([place]) 03/17/2013  . N&V (nausea and vomiting) 03/17/2013  . Diarrhea 03/17/2013  . DM (diabetes mellitus) ([place]) 03/17/2013  . Hypotension 03/17/2013  . Dizziness 03/17/2013    Past Surgical History:  Procedure Laterality Date  . CATARACT EXTRACTION W/ INTRAOCULAR LENS  IMPLANT, BILATERAL  ~ 2007  . COLONOSCOPY W/ POLYPECTOMY  09/2011   "removed 7"  . CORONARY ANGIOPLASTY WITH STENT PLACEMENT  ~ 2004   "1"  . CORONARY ARTERY BYPASS GRAFT  1999   CABG X4  . EYE SURGERY    . RETINAL DETACHMENT SURGERY  10/2011   right eye       Family History  Problem Relation Age of Onset  . Heart attack Mother     Social History   Tobacco Use  . Smoking status: Former Smoker    Packs/day: 2.00    Years: 35.00    Pack years: 70.00    Types: Cigarettes    Quit date: 07/25/1993    Years since quitting: 27.3  . Smokeless tobacco: Former Systems developer    Quit date: 07/18/1995  Substance Use Topics  . Alcohol use: No    Comment: 02/08/12 "stopped ~ 1995"  . Drug use: No    Home Medications Prior to Admission medications   Medication Sig Start Date End Date Taking? Authorizing Provider  albuterol (VENTOLIN HFA) 108 (90 Base) MCG/ACT inhaler Inhale 2  puffs into the lungs every 4 (four) hours as needed for wheezing or shortness of breath. 10/21/19   Brand Males, MD  amLODipine (NORVASC) 5 MG tablet Take 5 mg by mouth daily. 03/30/17   [provider]  aspirin EC 81 MG tablet Take 1 tablet (81 mg total) by mouth daily. 10/21/15   Jettie Booze, MD  atenolol (TENORMIN) 25 MG tablet Take 25 mg by mouth daily.    [provider]  atorvastatin (LIPITOR) 20 MG tablet Take 1 tablet (20 mg total) by mouth daily. Please make overdue appt with Dr. Irish Lack before anymore refills. Thank you 1st attempt 09/04/20   Jettie Booze, MD  furosemide (LASIX) 80 MG tablet Take 2 tablets by mouth in the  a.m. and 1 tablet by mouth in the p.m.    [provider]  insulin aspart (NOVOLOG) 100 UNIT/ML injection Inject 14-15 Units into the skin 3 (three) times daily with meals.     [provider]  insulin detemir (LEVEMIR) 100 UNIT/ML injection Inject 20-22 Units into the skin 2 (two) times daily.     [provider]  isosorbide mononitrate (IMDUR) 60 MG 24 hr tablet TAKE 1 TABLET BY MOUTH EVERY DAY 05/22/20   Jettie Booze, MD  levothyroxine (SYNTHROID, LEVOTHROID) 75 MCG tablet Take 75 mcg by mouth daily.    [provider]  nitroGLYCERIN (NITROSTAT) 0.4 MG SL tablet DISSOLVE 1 TABLET UNDER THE TONGUE EVERY 5 MINUTES AS NEEDED FOR CHEST PAIN 12/24/18   Jettie Booze, MD  polyvinyl alcohol (LIQUIFILM TEARS) 1.4 % ophthalmic solution Place 1 drop into both eyes as needed (For dry eyes).    [provider]  umeclidinium-vilanterol (ANORO ELLIPTA) 62.5-25 MCG/INH AEPB Inhale 1 puff into the lungs daily.    [provider]    Allergies    Patient has no known allergies.  Review of Systems   Review of Systems  Constitutional: Negative for fever.  HENT: Negative for congestion.   Eyes: Negative for visual disturbance.  Respiratory: Negative for chest tightness.   Cardiovascular: Negative for chest pain.  Gastrointestinal: Negative for abdominal pain and vomiting.  Genitourinary: Negative for difficulty urinating.  Musculoskeletal: Negative for arthralgias.  Skin: Negative for color change.  Neurological: Negative for tremors and speech difficulty.  Psychiatric/Behavioral: Negative for agitation.  All other systems reviewed and are negative.   Physical Exam Updated Vital Signs BP (!) 153/76   Pulse 96   Temp (!) 97.4 F (36.3 C) (Oral)   Resp (!) 22   Ht 5\' 11"  (1.803 m)   Wt 111.6 kg   SpO2 91%   BMI 34.31 kg/m   Physical Exam Vitals and nursing note reviewed.  Constitutional:      Appearance: Normal appearance. He  is not diaphoretic.  HENT:     Head: Normocephalic and atraumatic.     Nose: Nose normal.  Eyes:     Conjunctiva/sclera: Conjunctivae normal.     Pupils: Pupils are equal, round, and reactive to light.  Cardiovascular:     Rate and Rhythm: Normal rate and regular rhythm.     Pulses: Normal pulses.     Heart sounds: Normal heart sounds.  Pulmonary:     Breath sounds: Wheezing present.  Abdominal:     General: Abdomen is flat. Bowel sounds are normal.     Palpations: Abdomen is soft.     Tenderness: There is no abdominal tenderness. There is no guarding.  Musculoskeletal:  General: Normal range of motion.     Cervical back: Normal range of motion and neck supple.  Skin:    General: Skin is warm and dry.     Capillary Refill: Capillary refill takes less than 2 seconds.  Neurological:     General: No focal deficit present.     Mental Status: He is alert and oriented to person, place, and time.     Deep Tendon Reflexes: Reflexes normal.  Psychiatric:        Mood and Affect: Mood normal.        Behavior: Behavior normal.     ED Results / Procedures / Treatments   Labs (all labs ordered are listed, but only abnormal results are displayed) Results for orders placed or performed during the hospital encounter of 11/23/20  CBC with Differential/Platelet  Result Value Ref Range   WBC 9.8 4.0 - 10.5 K/uL   RBC 4.87 4.22 - 5.81 MIL/uL   Hemoglobin 14.8 13.0 - 17.0 g/dL   HCT 46.0 39.0 - 52.0 %   MCV 94.5 80.0 - 100.0 fL   MCH 30.4 26.0 - 34.0 pg   MCHC 32.2 30.0 - 36.0 g/dL   RDW 12.5 11.5 - 15.5 %   Platelets 178 150 - 400 K/uL   nRBC 0.0 0.0 - 0.2 %   Neutrophils Relative % 86 %   Neutro Abs 8.4 (H) 1.7 - 7.7 K/uL   Lymphocytes Relative 4 %   Lymphs Abs 0.4 (L) 0.7 - 4.0 K/uL   Monocytes Relative 8 %   Monocytes Absolute 0.8 0.1 - 1.0 K/uL   Eosinophils Relative 0 %   Eosinophils Absolute 0.0 0.0 - 0.5 K/uL   Basophils Relative 0 %   Basophils Absolute 0.0 0.0 -  0.1 K/uL   Immature Granulocytes 2 %   Abs Immature Granulocytes 0.18 (H) 0.00 - 0.07 K/uL  Brain natriuretic peptide  Result Value Ref Range   B Natriuretic Peptide 11.5 0.0 - 100.0 pg/mL  Urinalysis, Routine w reflex microscopic Nasopharyngeal Swab  Result Value Ref Range   Color, Urine COLORLESS (A) YELLOW   APPearance CLEAR CLEAR   Specific Gravity, Urine 1.001 (L) 1.005 - 1.030   pH 6.0 5.0 - 8.0   Glucose, UA NEGATIVE NEGATIVE mg/dL   Hgb urine dipstick SMALL (A) NEGATIVE   Bilirubin Urine NEGATIVE NEGATIVE   Ketones, ur NEGATIVE NEGATIVE mg/dL   Protein, ur NEGATIVE NEGATIVE mg/dL   Nitrite NEGATIVE NEGATIVE   Leukocytes,Ua NEGATIVE NEGATIVE   Bacteria, UA NONE SEEN NONE SEEN  I-stat chem 8, ED (not at [facility] or [facility])  Result Value Ref Range   Sodium 139 135 - 145 mmol/L   Potassium 4.4 3.5 - 5.1 mmol/L   Chloride 101 98 - 111 mmol/L   BUN 41 (H) 8 - 23 mg/dL   Creatinine, Ser 1.60 (H) 0.61 - 1.24 mg/dL   Glucose, Bld 164 (H) 70 - 99 mg/dL   Calcium, Ion 0.96 (L) 1.15 - 1.40 mmol/L   TCO2 30 22 - 32 mmol/L   Hemoglobin 14.6 13.0 - 17.0 g/dL   HCT 43.0 39.0 - 52.0 %  POC CBG, ED  Result Value Ref Range   Glucose-Capillary 139 (H) 70 - 99 mg/dL  CBG monitoring, ED  Result Value Ref Range   Glucose-Capillary 161 (H) 70 - 99 mg/dL  Troponin I (High Sensitivity)  Result Value Ref Range   Troponin I (High Sensitivity) 30 (H) <18 ng/L   CT Head Wo  Contrast  Result Date: 11/23/2020 CLINICAL DATA:  Unwitnessed fall with head strike EXAM: CT HEAD WITHOUT CONTRAST CT CERVICAL SPINE WITHOUT CONTRAST TECHNIQUE: Multidetector CT imaging of the head and cervical spine was performed following the standard protocol without intravenous contrast. Multiplanar CT image reconstructions of the cervical spine were also generated. COMPARISON:  CT head 09/21/2009 FINDINGS: CT HEAD FINDINGS Brain: Motion degraded imaging. No evidence of acute infarction, hemorrhage, hydrocephalus, extra-axial  collection, visible mass lesion or mass effect. Symmetric prominence of the ventricles, cisterns and sulci compatible with parenchymal volume loss. Patchy areas of white matter hypoattenuation are most compatible with chronic microvascular angiopathy. Scattered dural calcifications and a general distribution similar to comparison CT. Vascular: Atherosclerotic calcification of the carotid siphons and intradural vertebral arteries. No hyperdense vessel. Skull: Some right occipital scalp stranding appears chronic and may be related to prior scarring. No new acute scalp swelling or hematoma. No visible calvarial fracture within the limitations of this motion degraded exam. No visible facial bone fracture or other acute osseous injury within the margins of imaging. Sinuses/Orbits: Mural thickening in the maxillary sinus with some mild atelectatic changes of the sinus itself. Likely reflecting chronicity. No layering air-fluid levels or pneumatized secretions. Mastoid air cells and middle ear cavities are clear. Other: Edentulous with mandibular prognathism seen on scout view. CT CERVICAL SPINE FINDINGS Alignment: Slightly motion degraded imaging despite multiple attempts at acquisition. Straightening and slight reversal the normal cervical lordosis centered at the C5-6 level. Slight levocurvature of the cervical spine as well centered C4-5. No evidence of traumatic listhesis. No abnormally widened, perched or jumped facets. Bony fusion across the C2-C4 posterior facets. Normal alignment of the craniocervical and atlantoaxial articulations. Skull base and vertebrae: Bony fusion across the C2-C4 posterior articular facets, as described above. No discernible acute skull base or vertebral body fracture is seen. Multilevel cervical spondylitic changes as detailed below. Additional arthrosis at the atlantodental and basion dens intervals Soft tissues and spinal canal: No pre or paravertebral fluid or swelling. No visible canal  hematoma. Airways patent. Disc levels: Multilevel intervertebral disc height loss with spondylitic endplate changes. Posterior disc osteophyte complexes are most pronounced C5-6, C6-7 with some mild resulting canal stenosis. Additional multilevel uncinate spurring and facet hypertrophic changes are present as well resulting in mild-to-moderate multilevel neural foraminal narrowing. Upper chest: No acute abnormality in the upper chest or imaged lung apices. Cervical carotid atherosclerosis. Other: No concerning thyroid nodules or masses. IMPRESSION: 1. Motion degradation of the imaging despite multiple attempts at acquisition. 2. No acute intracranial abnormality. No acute scalp swelling, hematoma or calvarial fracture. 3. Background of parenchymal volume loss, microvascular angiopathy, and intracranial atherosclerosis. 4. No acute cervical spine fracture or traumatic listhesis. 5. Multilevel cervical spondylitic changes as detailed above. Electronically Signed   By: Lovena Le M.D.   On: 11/23/2020 04:22   CT Cervical Spine Wo Contrast  Result Date: 11/23/2020 CLINICAL DATA:  Unwitnessed fall with head strike EXAM: CT HEAD WITHOUT CONTRAST CT CERVICAL SPINE WITHOUT CONTRAST TECHNIQUE: Multidetector CT imaging of the head and cervical spine was performed following the standard protocol without intravenous contrast. Multiplanar CT image reconstructions of the cervical spine were also generated. COMPARISON:  CT head 09/21/2009 FINDINGS: CT HEAD FINDINGS Brain: Motion degraded imaging. No evidence of acute infarction, hemorrhage, hydrocephalus, extra-axial collection, visible mass lesion or mass effect. Symmetric prominence of the ventricles, cisterns and sulci compatible with parenchymal volume loss. Patchy areas of white matter hypoattenuation are most compatible with chronic microvascular angiopathy. Scattered  dural calcifications and a general distribution similar to comparison CT. Vascular: Atherosclerotic  calcification of the carotid siphons and intradural vertebral arteries. No hyperdense vessel. Skull: Some right occipital scalp stranding appears chronic and may be related to prior scarring. No new acute scalp swelling or hematoma. No visible calvarial fracture within the limitations of this motion degraded exam. No visible facial bone fracture or other acute osseous injury within the margins of imaging. Sinuses/Orbits: Mural thickening in the maxillary sinus with some mild atelectatic changes of the sinus itself. Likely reflecting chronicity. No layering air-fluid levels or pneumatized secretions. Mastoid air cells and middle ear cavities are clear. Other: Edentulous with mandibular prognathism seen on scout view. CT CERVICAL SPINE FINDINGS Alignment: Slightly motion degraded imaging despite multiple attempts at acquisition. Straightening and slight reversal the normal cervical lordosis centered at the C5-6 level. Slight levocurvature of the cervical spine as well centered C4-5. No evidence of traumatic listhesis. No abnormally widened, perched or jumped facets. Bony fusion across the C2-C4 posterior facets. Normal alignment of the craniocervical and atlantoaxial articulations. Skull base and vertebrae: Bony fusion across the C2-C4 posterior articular facets, as described above. No discernible acute skull base or vertebral body fracture is seen. Multilevel cervical spondylitic changes as detailed below. Additional arthrosis at the atlantodental and basion dens intervals Soft tissues and spinal canal: No pre or paravertebral fluid or swelling. No visible canal hematoma. Airways patent. Disc levels: Multilevel intervertebral disc height loss with spondylitic endplate changes. Posterior disc osteophyte complexes are most pronounced C5-6, C6-7 with some mild resulting canal stenosis. Additional multilevel uncinate spurring and facet hypertrophic changes are present as well resulting in mild-to-moderate multilevel  neural foraminal narrowing. Upper chest: No acute abnormality in the upper chest or imaged lung apices. Cervical carotid atherosclerosis. Other: No concerning thyroid nodules or masses. IMPRESSION: 1. Motion degradation of the imaging despite multiple attempts at acquisition. 2. No acute intracranial abnormality. No acute scalp swelling, hematoma or calvarial fracture. 3. Background of parenchymal volume loss, microvascular angiopathy, and intracranial atherosclerosis. 4. No acute cervical spine fracture or traumatic listhesis. 5. Multilevel cervical spondylitic changes as detailed above. Electronically Signed   By: Lovena Le M.D.   On: 11/23/2020 04:22   DG Chest Portable 1 View  Result Date: 11/23/2020 CLINICAL DATA:  Status post fall. EXAM: PORTABLE CHEST 1 VIEW COMPARISON:  August 30, 2017 FINDINGS: Multiple sternal wires and vascular clips are seen. There is no evidence of acute infiltrate, pleural effusion or pneumothorax. The heart size and mediastinal contours are within normal limits. The visualized skeletal structures are unremarkable. IMPRESSION: No active cardiopulmonary disease. Electronically Signed   By: Virgina Norfolk M.D.   On: 11/23/2020 03:41    EKG EKG Interpretation  Date/Time:  Monday Nov 23 2020 03:05:31 EDT Ventricular Rate:  91 PR Interval:  268 QRS Duration: 96 QT Interval:  372 QTC Calculation: 457 R Axis:   64 Text Interpretation: Sinus rhythm with 1st degree A-V block Premature atrial complexes Confirmed by Randal Buba, Zayyan Mullen ([id number]) on 11/23/2020 3:33:51 AM   Radiology No results found.  Procedures Procedures   Medications Ordered in ED Medications  aerochamber Z-Stat Plus/medium 1 each (1 each Other Not Given 11/23/20 0459)  cefTRIAXone (ROCEPHIN) 1 g in sodium chloride 0.9 % 100 mL IVPB (has no administration in time range)  methylPREDNISolone sodium succinate (SOLU-MEDROL) 125 mg/2 mL injection 125 mg (125 mg Intravenous Given 11/23/20 0451)   ipratropium-albuterol (DUONEB) 0.5-2.5 (3) MG/3ML nebulizer solution 3 mL (3 mLs Nebulization Given 11/23/20  [place])    ED Course  I have reviewed the triage vital signs and the nursing notes.  Pertinent labs & imaging results that were available during my care of the patient were reviewed by me and considered in my medical decision making (see chart for details).    Luke [name] was evaluated in Emergency Department on 11/23/2020 for the symptoms described in the history of present illness. He was evaluated in the context of the global COVID-19 pandemic, which necessitated consideration that the patient might be at risk for infection with the SARS-CoV-2 virus that causes COVID-19. Institutional protocols and algorithms that pertain to the evaluation of patients at risk for COVID-19 are in a state of rapid change based on information released by regulatory bodies including the CDC and federal and state organizations. These policies and algorithms were followed during the patient's care in the ED.  Final Clinical Impression(s) / ED Diagnosis   Admit to medicine for COPD exacerbation with falls and hypoglycemia   Antjuan Rothe, MD 11/23/20 [id number]

## 2020-11-23 NOTE — ED Notes (Signed)
Patient is resting comfortably; currently asleep

## 2020-11-23 NOTE — ED Notes (Signed)
Pt ambulate to and from restroom 02 drop 85% rm air. Pt place back on oxygen 02 96%. Pt c/o pain in knee

## 2020-11-23 NOTE — ED Notes (Signed)
Pt placed on 3L O2 via [place]. O2 saturations dropping to 85, 86 while at rest. RR 22-28 at rest

## 2020-11-23 NOTE — Progress Notes (Addendum)
PROGRESS NOTE  Luke [name] ZOX:[id number] DOB: [birth year] DOA: 11/23/2020 PCP: Aretta Nip, MD  Brief History   87 year old man presented after a fall at home, found to be hypoglycemic at home, patient reported taking too much insulin.  Was also found to be hypoxic.  Admitted for fall at home, rule out syncope, hypoglycemia, mild exacerbation COPD A & P  Mild COPD exacerbation with acute hypoxic respiratory failure --Good air movement, speaks in full sentences.  No wheezing appreciated. --May have subclinical hypoxia at home.  Wean oxygen as tolerated.  May need home oxygen.  Hypoglycemia at home, diabetes mellitus type 2 on insulin --Per history, likely secondary to overuse of insulin.  Will discuss with family. --Monitor blood sugars here, currently high, restart Levemir at lower dose.  Fall at home.  Multiple per chart.  Etiology unclear.  No signs or symptoms or history at this point to suggest worrisome etiology.  No history to suggest TIA, dysrhythmia. --Troponin was mildly elevated but EKG nonacute.  Without specific signs or symptoms to suggest ACS, interpret troponin as trivial and clinically not relevant.  No further evaluation suggested from that standpoint. --Follow-up echocardiogram and carotid ultrasound ordered on admission.  At this point I do not see indication for cardiology consultation or Holter.  We will gather further history from family. --Check orthostatics tomorrow. --PT evaluation.  CKD stage III --At baseline  CAD, chronic diastolic CHF --Continue aspirin, statin --Appears euvolemic.  Hypothyroidism --Continue levothyroxine.  Disposition Plan:  Discussion:   Status is: Inpatient  Remains inpatient appropriate because:Inpatient level of care appropriate due to severity of illness   Dispo: The patient is from: Home              Anticipated d/c is to: Home              Patient currently is not medically stable to d/c.   Difficult to place  patient No  DVT prophylaxis: heparin injection 5,000 Units Start: 11/23/20 0700 SCDs Start: 11/23/20 8119   Code Status: Full Code Level of care: Telemetry Family Communication: none  Murray Hodgkins, MD  [facility] Direct contact: see [email or web address] (further directions at bottom of note if needed) 7PM-7AM contact night coverage as at bottom of note 11/23/2020, 1:08 PM  LOS: 0 days   Significant Hospital Events   . 5/1 admit for hypoglycemia   Consults:  . None   Procedures:  . None  Significant Diagnostic Tests:  . CT head/neck no acute changes . CXR NAD   Micro Data:  . COVID negative   Antimicrobials:  . None   Interval History/Subjective  CC: f/u hypoglycemia  Feels fine now, no shortness of breath, no pain.  Not sure what happened except that he fell.  No family present currently.  Objective   Vitals:  Vitals:   11/23/20 1030 11/23/20 1218  BP: (!) 152/72 136/84  Pulse: 78 95  Resp: (!) 24 (!) 30  Temp:  97.8 F (36.6 C)  SpO2: 96% (!) 89%    Exam:  Constitutional:   . Appears calm and comfortable in the emergency department. ENMT:  . grossly normal hearing  . Lips appear normal Respiratory:  . CTA bilaterally, no w/r/r.  . Respiratory effort normal. Cardiovascular:  . RRR, no m/r/g . 1+ BLE extremity edema   Abdomen:  . soft Psychiatric:  . Mental status o Mood, affect appropriate  I have personally reviewed the following:   Today's Data  . Blood sugars 200-300 .  Creatinine 1.60 probably baseline BNP unremarkable, troponins mildly elevated . CBC unremarkable  Scheduled Meds: . aerochamber Z-Stat Plus/medium  1 each Other Once  . amLODipine  5 mg Oral Daily  . aspirin EC  81 mg Oral Daily  . atenolol  25 mg Oral Daily  . atorvastatin  20 mg Oral Daily  . cholecalciferol  5,000 Units Oral Daily  . heparin  5,000 Units Subcutaneous Q8H  . insulin aspart  0-15 Units Subcutaneous TID WC  . insulin detemir  15 Units Subcutaneous  Daily  . levothyroxine  88 mcg Oral Daily  . [START ON 11/24/2020] predniSONE  40 mg Oral Q breakfast  . sodium chloride flush  3 mL Intravenous Q12H  . umeclidinium-vilanterol  1 puff Inhalation Daily   Continuous Infusions: . sodium chloride      Principal Problem:   COPD with acute exacerbation (HCC) Active Problems:   DM (diabetes mellitus) ([place])   Coronary atherosclerosis of native coronary artery   Mixed hyperlipidemia   Chronic diastolic heart failure (HCC)   Essential hypertension, benign   COPD (chronic obstructive pulmonary disease) (HCC)   Primary hypothyroidism   Vitamin D deficiency   Falls   Syncope and collapse   Hypoglycemia   LOS: 0 days   How to contact the [facility] Attending or Consulting provider [place] or covering provider during after hours [place], for this patient?  1. Check the care team in [facility] and look for a) attending/consulting [facility] provider listed and b) the [facility] team listed 2. Log into [email or web address] and use [place]'s universal password to access. If you do not have the password, please contact the hospital operator. 3. Locate the [facility] provider you are looking for under [facility] and page to a number that you can be directly reached. 4. If you still have difficulty reaching the provider, please page the [facility] (Director on Call) for the Hospitalists listed on amion for assistance.    Prolonged services 45 minutes, approximately [phone]

## 2020-11-23 NOTE — H&P (Signed)
History and Physical    Luke [name] NWG:[id number] DOB: 1933-03-11 DOA: 11/23/2020  PCP: Aretta Nip, MD    Patient coming from:  Home   Chief Complaint:  Fall/ syncope   HPI: Luke [name] is a 87 y.o. male seen in ed with complaints of fall and low blood sugars. Pt has had multiple falls and cuts and bruises on left upper ext.Pt's fall was unwitnessed and Upon ems bgt pt's sugar was 52 and pt given juice and peanut butter and repeat was 101. Pt reports taking too much insulin. In ed pt also dropping his o2 sats and started on 3 L [place]. Pt has son and wife at bedside and says he wants to know why he wakes up on the floor. He does not remember. He does recall using bathroom frequently at night, but no incomplete emptying.Sons says he does not use his inhalers.   Pt has past medical history of COPD, heart disease, chronic kidney disease stage III, hypothyroidism, CVA diabetes mellitus type 2. ED Course:  Vitals:   11/23/20 0345 11/23/20 0430 11/23/20 0452 11/23/20 0615  BP: 126/88 (!) 159/68 (!) 154/73 (!) 165/70  Pulse: 84 77 (!) 105 86  Resp: (!) 22 (!) 27 15 16   Temp:      TempSrc:      SpO2: 98% 97% (!) 88% 98%  Weight:      Height:      In ed pt is afebrile, alert and hypertensive.Initial head ct is negative. EKG shows s.rhythm 91 with prolonged pr interval / 1 degrees av block.chest xray is negative, head ct is negative for any acute IC abnormality or fracture.  CMP shows a glucose of 164 serum creatinine of 1.60. BNP 11.5, troponin of 40, CBC within normal limits.  In the emergency room patient received albuterol nebulizer, Solu-Medrol, DuoNeb.  Review of Systems:  Review of Systems  Respiratory: Positive for shortness of breath.   Cardiovascular: Positive for leg swelling.  Musculoskeletal: Positive for falls.  All other systems reviewed and are negative.    Past Medical History:  Diagnosis Date  . Anginal pain ([place]) 02/08/12  . Arthritis    "all over"  .  CKD (chronic kidney disease), stage III ([place])   . COPD (chronic obstructive pulmonary disease) ([place])   . Coronary artery disease    a. MI/CABG in 9/99 at [facility], [place] with (RIMA to LAD, LIMA to OM, SVG to PDA. b. LAD drug eluting stent in 3/09 through the graft to the distal LAD.  Marland Kitchen Exertional dyspnea    "doesn't take much these days"  . H/O hiatal hernia   . Hyperlipemia   . Hypertension   . Hypothyroidism   . Hypoxia   . Stroke [facility]) 09/2010   denies residual  . Type II diabetes mellitus ([place])     Past Surgical History:  Procedure Laterality Date  . CATARACT EXTRACTION W/ INTRAOCULAR LENS  IMPLANT, BILATERAL  ~ 2007  . COLONOSCOPY W/ POLYPECTOMY  09/2011   "removed 7"  . CORONARY ANGIOPLASTY WITH STENT PLACEMENT  ~ 2004   "1"  . CORONARY ARTERY BYPASS GRAFT  1999   CABG X4  . EYE SURGERY    . RETINAL DETACHMENT SURGERY  10/2011   right eye     reports that he quit smoking about 27 years ago. His smoking use included cigarettes. He has a 70.00 pack-year smoking history. He quit smokeless tobacco use about 25 years ago. He reports that he does  not drink alcohol and does not use drugs.  No Known Allergies  Family History  Problem Relation Age of Onset  . Heart attack Mother     Prior to Admission medications   Medication Sig Start Date End Date Taking? Authorizing Provider  albuterol (VENTOLIN HFA) 108 (90 Base) MCG/ACT inhaler Inhale 2 puffs into the lungs every 4 (four) hours as needed for wheezing or shortness of breath. 10/21/19   Brand Males, MD  amLODipine (NORVASC) 5 MG tablet Take 5 mg by mouth daily. 03/30/17   [provider]  aspirin EC 81 MG tablet Take 1 tablet (81 mg total) by mouth daily. 10/21/15   Jettie Booze, MD  atenolol (TENORMIN) 25 MG tablet Take 25 mg by mouth daily.    [provider]  atorvastatin (LIPITOR) 20 MG tablet Take 1 tablet (20 mg total) by mouth daily. Please make overdue appt with Dr.  Irish Lack before anymore refills. Thank you 1st attempt 09/04/20   Jettie Booze, MD  furosemide (LASIX) 80 MG tablet Take 2 tablets by mouth in the a.m. and 1 tablet by mouth in the p.m.    [provider]  insulin aspart (NOVOLOG) 100 UNIT/ML injection Inject 14-15 Units into the skin 3 (three) times daily with meals.     [provider]  insulin detemir (LEVEMIR) 100 UNIT/ML injection Inject 20-22 Units into the skin 2 (two) times daily.     [provider]  isosorbide mononitrate (IMDUR) 60 MG 24 hr tablet TAKE 1 TABLET BY MOUTH EVERY DAY 05/22/20   Jettie Booze, MD  levothyroxine (SYNTHROID, LEVOTHROID) 75 MCG tablet Take 75 mcg by mouth daily.    [provider]  nitroGLYCERIN (NITROSTAT) 0.4 MG SL tablet DISSOLVE 1 TABLET UNDER THE TONGUE EVERY 5 MINUTES AS NEEDED FOR CHEST PAIN 12/24/18   Jettie Booze, MD  polyvinyl alcohol (LIQUIFILM TEARS) 1.4 % ophthalmic solution Place 1 drop into both eyes as needed (For dry eyes).    [provider]  umeclidinium-vilanterol (ANORO ELLIPTA) 62.5-25 MCG/INH AEPB Inhale 1 puff into the lungs daily.    [provider]    Physical Exam: Vitals:   11/23/20 0345 11/23/20 0430 11/23/20 0452 11/23/20 0615  BP: 126/88 (!) 159/68 (!) 154/73 (!) 165/70  Pulse: 84 77 (!) 105 86  Resp: (!) 22 (!) 27 15 16   Temp:      TempSrc:      SpO2: 98% 97% (!) 88% 98%  Weight:      Height:       Physical Exam Vitals and nursing note reviewed.  Constitutional:      General: He is not in acute distress.    Appearance: He is obese. He is ill-appearing. He is not toxic-appearing or diaphoretic.  HENT:     Head: Normocephalic and atraumatic.     Right Ear: External ear normal.     Left Ear: External ear normal.     Nose: Nose normal.     Mouth/Throat:     Mouth: Mucous membranes are moist.  Eyes:     Extraocular Movements: Extraocular movements intact.     Pupils: Pupils are equal, round,  and reactive to light.  Cardiovascular:     Rate and Rhythm: Normal rate and regular rhythm.     Pulses: Normal pulses.     Heart sounds: Normal heart sounds.  Pulmonary:     Effort: Pulmonary effort is normal.     Breath sounds: Examination of  the right-middle field reveals wheezing. Examination of the left-middle field reveals wheezing. Examination of the right-lower field reveals wheezing. Examination of the left-lower field reveals wheezing. Wheezing present.  Abdominal:     General: Bowel sounds are normal. There is no distension.     Palpations: Abdomen is soft. There is no mass.     Tenderness: There is no abdominal tenderness. There is no guarding.     Hernia: No hernia is present.  Musculoskeletal:     Right lower leg: 2+ Edema present.     Left lower leg: 2+ Edema present.  Skin:    General: Skin is warm.  Neurological:     General: No focal deficit present.     Mental Status: He is alert and oriented to person, place, and time.  Psychiatric:        Mood and Affect: Mood normal.        Behavior: Behavior normal.      Labs on Admission: I have personally reviewed following labs and imaging studies  No results for input(s): CKTOTAL, CKMB, TROPONINI in the last 72 hours. Lab Results  Component Value Date   WBC 9.8 11/23/2020   HGB 14.8 11/23/2020   HCT 46.0 11/23/2020   MCV 94.5 11/23/2020   PLT 178 11/23/2020    Recent Labs  Lab 11/23/20 0322  NA 139  K 4.4  CL 101  BUN 41*  CREATININE 1.60*  GLUCOSE 164*   No results found for: CHOL, HDL, LDLCALC, TRIG No results found for: DDIMER Invalid input(s): POCBNP  Urinalysis    Component Value Date/Time   COLORURINE COLORLESS (A) 11/23/2020 0432   APPEARANCEUR CLEAR 11/23/2020 0432   LABSPEC 1.001 (L) 11/23/2020 0432   PHURINE 6.0 11/23/2020 0432   GLUCOSEU NEGATIVE 11/23/2020 0432   HGBUR SMALL (A) 11/23/2020 0432   BILIRUBINUR NEGATIVE 11/23/2020 0432   KETONESUR NEGATIVE 11/23/2020 0432   PROTEINUR  NEGATIVE 11/23/2020 0432   UROBILINOGEN 0.2 03/19/2013 1636   NITRITE NEGATIVE 11/23/2020 0432   LEUKOCYTESUR NEGATIVE 11/23/2020 0432   COVID-19 Labs No results for input(s): DDIMER, FERRITIN, LDH, CRP in the last 72 hours. Lab Results  Component Value Date   [place] NEGATIVE 11/23/2020    Radiological Exams on Admission: CT Head Wo Contrast  Result Date: 11/23/2020 CLINICAL DATA:  Unwitnessed fall with head strike EXAM: CT HEAD WITHOUT CONTRAST CT CERVICAL SPINE WITHOUT CONTRAST TECHNIQUE: Multidetector CT imaging of the head and cervical spine was performed following the standard protocol without intravenous contrast. Multiplanar CT image reconstructions of the cervical spine were also generated. COMPARISON:  CT head 09/21/2009 FINDINGS: CT HEAD FINDINGS Brain: Motion degraded imaging. No evidence of acute infarction, hemorrhage, hydrocephalus, extra-axial collection, visible mass lesion or mass effect. Symmetric prominence of the ventricles, cisterns and sulci compatible with parenchymal volume loss. Patchy areas of white matter hypoattenuation are most compatible with chronic microvascular angiopathy. Scattered dural calcifications and a general distribution similar to comparison CT. Vascular: Atherosclerotic calcification of the carotid siphons and intradural vertebral arteries. No hyperdense vessel. Skull: Some right occipital scalp stranding appears chronic and may be related to prior scarring. No new acute scalp swelling or hematoma. No visible calvarial fracture within the limitations of this motion degraded exam. No visible facial bone fracture or other acute osseous injury within the margins of imaging. Sinuses/Orbits: Mural thickening in the maxillary sinus with some mild atelectatic changes of the sinus itself. Likely reflecting chronicity. No layering air-fluid levels or pneumatized secretions. Mastoid air cells and middle  ear cavities are clear. Other: Edentulous with mandibular  prognathism seen on scout view. CT CERVICAL SPINE FINDINGS Alignment: Slightly motion degraded imaging despite multiple attempts at acquisition. Straightening and slight reversal the normal cervical lordosis centered at the C5-6 level. Slight levocurvature of the cervical spine as well centered C4-5. No evidence of traumatic listhesis. No abnormally widened, perched or jumped facets. Bony fusion across the C2-C4 posterior facets. Normal alignment of the craniocervical and atlantoaxial articulations. Skull base and vertebrae: Bony fusion across the C2-C4 posterior articular facets, as described above. No discernible acute skull base or vertebral body fracture is seen. Multilevel cervical spondylitic changes as detailed below. Additional arthrosis at the atlantodental and basion dens intervals Soft tissues and spinal canal: No pre or paravertebral fluid or swelling. No visible canal hematoma. Airways patent. Disc levels: Multilevel intervertebral disc height loss with spondylitic endplate changes. Posterior disc osteophyte complexes are most pronounced C5-6, C6-7 with some mild resulting canal stenosis. Additional multilevel uncinate spurring and facet hypertrophic changes are present as well resulting in mild-to-moderate multilevel neural foraminal narrowing. Upper chest: No acute abnormality in the upper chest or imaged lung apices. Cervical carotid atherosclerosis. Other: No concerning thyroid nodules or masses. IMPRESSION: 1. Motion degradation of the imaging despite multiple attempts at acquisition. 2. No acute intracranial abnormality. No acute scalp swelling, hematoma or calvarial fracture. 3. Background of parenchymal volume loss, microvascular angiopathy, and intracranial atherosclerosis. 4. No acute cervical spine fracture or traumatic listhesis. 5. Multilevel cervical spondylitic changes as detailed above. Electronically Signed   By: Lovena Le M.D.   On: 11/23/2020 04:22   CT Cervical Spine Wo  Contrast  Result Date: 11/23/2020 CLINICAL DATA:  Unwitnessed fall with head strike EXAM: CT HEAD WITHOUT CONTRAST CT CERVICAL SPINE WITHOUT CONTRAST TECHNIQUE: Multidetector CT imaging of the head and cervical spine was performed following the standard protocol without intravenous contrast. Multiplanar CT image reconstructions of the cervical spine were also generated. COMPARISON:  CT head 09/21/2009 FINDINGS: CT HEAD FINDINGS Brain: Motion degraded imaging. No evidence of acute infarction, hemorrhage, hydrocephalus, extra-axial collection, visible mass lesion or mass effect. Symmetric prominence of the ventricles, cisterns and sulci compatible with parenchymal volume loss. Patchy areas of white matter hypoattenuation are most compatible with chronic microvascular angiopathy. Scattered dural calcifications and a general distribution similar to comparison CT. Vascular: Atherosclerotic calcification of the carotid siphons and intradural vertebral arteries. No hyperdense vessel. Skull: Some right occipital scalp stranding appears chronic and may be related to prior scarring. No new acute scalp swelling or hematoma. No visible calvarial fracture within the limitations of this motion degraded exam. No visible facial bone fracture or other acute osseous injury within the margins of imaging. Sinuses/Orbits: Mural thickening in the maxillary sinus with some mild atelectatic changes of the sinus itself. Likely reflecting chronicity. No layering air-fluid levels or pneumatized secretions. Mastoid air cells and middle ear cavities are clear. Other: Edentulous with mandibular prognathism seen on scout view. CT CERVICAL SPINE FINDINGS Alignment: Slightly motion degraded imaging despite multiple attempts at acquisition. Straightening and slight reversal the normal cervical lordosis centered at the C5-6 level. Slight levocurvature of the cervical spine as well centered C4-5. No evidence of traumatic listhesis. No abnormally  widened, perched or jumped facets. Bony fusion across the C2-C4 posterior facets. Normal alignment of the craniocervical and atlantoaxial articulations. Skull base and vertebrae: Bony fusion across the C2-C4 posterior articular facets, as described above. No discernible acute skull base or vertebral body fracture is seen. Multilevel cervical spondylitic changes  as detailed below. Additional arthrosis at the atlantodental and basion dens intervals Soft tissues and spinal canal: No pre or paravertebral fluid or swelling. No visible canal hematoma. Airways patent. Disc levels: Multilevel intervertebral disc height loss with spondylitic endplate changes. Posterior disc osteophyte complexes are most pronounced C5-6, C6-7 with some mild resulting canal stenosis. Additional multilevel uncinate spurring and facet hypertrophic changes are present as well resulting in mild-to-moderate multilevel neural foraminal narrowing. Upper chest: No acute abnormality in the upper chest or imaged lung apices. Cervical carotid atherosclerosis. Other: No concerning thyroid nodules or masses. IMPRESSION: 1. Motion degradation of the imaging despite multiple attempts at acquisition. 2. No acute intracranial abnormality. No acute scalp swelling, hematoma or calvarial fracture. 3. Background of parenchymal volume loss, microvascular angiopathy, and intracranial atherosclerosis. 4. No acute cervical spine fracture or traumatic listhesis. 5. Multilevel cervical spondylitic changes as detailed above. Electronically Signed   By: Lovena Le M.D.   On: 11/23/2020 04:22   DG Chest Portable 1 View  Result Date: 11/23/2020 CLINICAL DATA:  Status post fall. EXAM: PORTABLE CHEST 1 VIEW COMPARISON:  August 30, 2017 FINDINGS: Multiple sternal wires and vascular clips are seen. There is no evidence of acute infiltrate, pleural effusion or pneumothorax. The heart size and mediastinal contours are within normal limits. The visualized skeletal structures  are unremarkable. IMPRESSION: No active cardiopulmonary disease. Electronically Signed   By: Virgina Norfolk M.D.   On: 11/23/2020 03:41    EKG: Independently reviewed.  S.Rhythm 91, PR 268 1 degree av block.   Echocardiogram 11/2017: Study Conclusions : - Left ventricle: The cavity size was normal. There was severe  focal basal hypertrophy. Systolic function was normal. The  estimated ejection fraction was in the range of 55% to 60%. Wall  motion was normal; there were no regional wall motion  abnormalities. Features are consistent with a pseudonormal left  ventricular filling pattern, with concomitant abnormal relaxation  and increased filling pressure (grade 2 diastolic dysfunction).  - Aorta: Aortic root dimension: 40 mm (ED).  - Aortic root: The aortic root was mildly dilated.  - Mitral valve: Calcified annulus.  - Left atrium: The atrium was mildly dilated.  - Atrial septum: There was increased thickness of the septum,  consistent with lipomatous hypertrophy.  - Tricuspid valve: There was trivial regurgitation.    Assessment/Plan Principal Problem:   Syncope and collapse Active Problems:   Falls   DM (diabetes mellitus) (HCC)   Coronary atherosclerosis of native coronary artery   Mixed hyperlipidemia   Chronic diastolic heart failure (HCC)   Essential hypertension, benign   COPD (chronic obstructive pulmonary disease) (HCC)   Primary hypothyroidism   B12 deficiency   Vitamin D deficiency   Syncope/ Collapse/ Falls: -d/d include tia/ dysrhythmia related/ hypovolemia / hypotension. -we will obtain echo and carotid usg and consult cardiology / holter if needed while on monitor here in house. -fall precaution/ orthostatic vitals. -ivf maintenance. -vit b12 shot. -vit d 5000 units daily.   DM II: Hold home regimen and cont with ssi. Carb consistent diet.  CAD: Cont asa 81/ statin.  Diastolic heart failure: Stable. Resume lasix once orthostatic  are normal.  HTN: Resume home meds once orthostatic are normal.   Hyperlipidemia: Cont statin therapy.   COPD: Mild exacerbation. Cont steroids and supplemental oxygen.  Taper to po.  Hypothyroidism; TSh still high increase levothyroxine to 88 mcg.   b12 def: Start supplementation 1000 mcg x 1 now.   Vit d def: High risk  for falls. 5000 I.U daily x 1 month then 2000 units daily.       DVT prophylaxis:  scd's  Code Status:  Full code   Family Communication:  [place] (Spouse)  [phone] (Mobile)  Disposition Plan:  TBD   Consults called:  None   Admission status: Inpatient.    Para Skeans MD [facility] [phone] How to contact the [facility] Attending or Consulting provider [place] or covering provider during after hours [place], for this patient.    1. Check the care team in [facility] and look for a) attending/consulting [place] provider listed and b) the [facility] team listed 2. Log into [email or web address] and use [place]'s universal password to access. If you do not have the password, please contact the hospital operator. 3. Locate the [facility] provider you are looking for under [facility] and page to a number that you can be directly reached. 4. If you still have difficulty reaching the provider, please page the [facility] (Director on Call) for the Hospitalists listed on amion for assistance. [email or web address] Password [facility] 11/23/2020, 6:55 AM

## 2020-11-23 NOTE — Hospital Course (Signed)
87 year old man presented after a fall at home, found to be hypoglycemic at home, patient reported taking too much insulin.  Was also found to be hypoxic.  Admitted for fall at home, rule out syncope, hypoglycemia, mild exacerbation COPD A & P  Mild COPD exacerbation with acute hypoxic respiratory failure --Good air movement, speaks in full sentences.  No wheezing appreciated. --May have subclinical hypoxia at home.  Wean oxygen as tolerated.  May need home oxygen.  Hypoglycemia at home, diabetes mellitus type 2 on insulin --Per history, likely secondary to overuse of insulin.  Will discuss with family. --Monitor blood sugars here, currently high, restart Levemir at lower dose.  Fall at home.  Multiple per chart.  Etiology unclear.  No signs or symptoms or history at this point to suggest worrisome etiology.  No history to suggest TIA, dysrhythmia. --Troponin was mildly elevated but EKG nonacute.  Without specific signs or symptoms to suggest ACS, interpret troponin as trivial and clinically not relevant.  No further evaluation suggested from that standpoint. --Follow-up echocardiogram and carotid ultrasound ordered on admission.  At this point I do not see indication for cardiology consultation or Holter.  We will gather further history from family. --Check orthostatics tomorrow. --PT evaluation.  CKD stage III --At baseline  CAD, chronic diastolic CHF --Continue aspirin, statin --Appears euvolemic.  Hypothyroidism --Continue levothyroxine.

## 2020-11-23 NOTE — Progress Notes (Signed)
  Echocardiogram 2D Echocardiogram has been performed.  Luke [name] 11/23/2020, 8:45 AM

## 2020-11-23 NOTE — ED Notes (Signed)
Ultrasound at bedside

## 2020-11-24 DIAGNOSIS — S40029A Contusion of unspecified upper arm, initial encounter: Secondary | ICD-10-CM | POA: Diagnosis not present

## 2020-11-24 DIAGNOSIS — E162 Hypoglycemia, unspecified: Secondary | ICD-10-CM | POA: Diagnosis not present

## 2020-11-24 DIAGNOSIS — R55 Syncope and collapse: Secondary | ICD-10-CM | POA: Diagnosis not present

## 2020-11-24 DIAGNOSIS — Z794 Long term (current) use of insulin: Secondary | ICD-10-CM | POA: Diagnosis not present

## 2020-11-24 DIAGNOSIS — E1159 Type 2 diabetes mellitus with other circulatory complications: Secondary | ICD-10-CM | POA: Diagnosis not present

## 2020-11-24 DIAGNOSIS — W19XXXA Unspecified fall, initial encounter: Secondary | ICD-10-CM | POA: Diagnosis not present

## 2020-11-24 LAB — GLUCOSE, CAPILLARY
Glucose-Capillary: 147 mg/dL — ABNORMAL HIGH (ref 70–99)
Glucose-Capillary: 198 mg/dL — ABNORMAL HIGH (ref 70–99)

## 2020-11-24 LAB — COMPREHENSIVE METABOLIC PANEL
ALT: 18 U/L (ref 0–44)
AST: 25 U/L (ref 15–41)
Albumin: 3.6 g/dL (ref 3.5–5.0)
Alkaline Phosphatase: 62 U/L (ref 38–126)
Anion gap: 10 (ref 5–15)
BUN: 43 mg/dL — ABNORMAL HIGH (ref 8–23)
CO2: 28 mmol/L (ref 22–32)
Calcium: 8.8 mg/dL — ABNORMAL LOW (ref 8.9–10.3)
Chloride: 98 mmol/L (ref 98–111)
Creatinine, Ser: 1.45 mg/dL — ABNORMAL HIGH (ref 0.61–1.24)
GFR, Estimated: 47 mL/min — ABNORMAL LOW (ref >60)
Glucose, Bld: 180 mg/dL — ABNORMAL HIGH (ref 70–99)
Potassium: 4.9 mmol/L (ref 3.5–5.1)
Sodium: 136 mmol/L (ref 135–145)
Total Bilirubin: 0.6 mg/dL (ref 0.3–1.2)
Total Protein: 6.5 g/dL (ref 6.5–8.1)

## 2020-11-24 MED ORDER — INSULIN ASPART 100 UNIT/ML ~~LOC~~ SOLN: 10.0000 [IU] | Freq: Two times a day (BID) | SUBCUTANEOUS | Status: DC

## 2020-11-24 MED ORDER — INSULIN DETEMIR 100 UNIT/ML ~~LOC~~ SOLN: 15.0000 [IU] | Freq: Two times a day (BID) | SUBCUTANEOUS | Status: DC

## 2020-11-24 NOTE — Evaluation (Signed)
Physical Therapy Evaluation Patient Details Name: Luke [name] MRN: [id number] DOB: 03/17/1933 Today's Date: 11/24/2020   History of Present Illness  87 yo male admitted with hypoglycemia, falls at home, COPD exac. Hx of falls, DM, COPD, heart disease, CKD, CVA, OA  Clinical Impression  On eval, pt required Min guard-Min assist for mobility. He walked ~100 feet with use of hallway handrail for most of distance. He is unsteady. O2 90% on RA once back in room and seated- (had difficulty getting pulse ox reading while ambulating). Encouraged pt to use RW for ambulation safety. Recommend HHPT f/u after discharge.     Follow Up Recommendations Home health PT;Supervision/Assistance - 24 hour    Equipment Recommendations   (pt stated he has access to a RW at home)    Recommendations for Other Services       Precautions / Restrictions Precautions Precautions: Fall Restrictions Weight Bearing Restrictions: No      Mobility  Bed Mobility               General bed mobility comments: oob in recliner    Transfers Overall transfer level: Needs assistance   Transfers: Sit to/from Stand Sit to Stand: Min guard         General transfer comment: unsteady. increased time. cues for safety.  Ambulation/Gait Ambulation/Gait assistance: Min assist Gait Distance (Feet): 100 Feet Assistive device:  (hallway handrail vs no device) Gait Pattern/deviations: Step-through pattern;Decreased stride length     General Gait Details: unsteady. dyspnea 2/4 (had trouble getting reading on portable pulse ox while ambulating)-O2 90% on RA once back in room and seated. pt tolerated distance fairly well.  Stairs            Wheelchair Mobility    Modified Rankin (Stroke Patients Only)       Balance Overall balance assessment: Needs assistance         Standing balance support: Single extremity supported Standing balance-Leahy Scale: Fair                                Pertinent Vitals/Pain Pain Assessment: Faces Faces Pain Scale: Hurts little more Pain Location: bil knees Pain Descriptors / Indicators: Discomfort;Sore;Aching Pain Intervention(s): Limited activity within patient's tolerance;Monitored during session;Repositioned    Home Living Family/patient expects to be discharged to:: Private residence Living Arrangements: Spouse/significant other Available Help at Discharge: Family Type of Home: House Home Access: Ramped entrance     Home Layout: One level Home Equipment: Environmental consultant - 2 wheels      Prior Function Level of Independence: Independent               Hand Dominance        Extremity/Trunk Assessment   Upper Extremity Assessment Upper Extremity Assessment: Generalized weakness    Lower Extremity Assessment Lower Extremity Assessment: Generalized weakness    Cervical / Trunk Assessment Cervical / Trunk Assessment: Normal  Communication   Communication: No difficulties  Cognition Arousal/Alertness: Awake/alert Behavior During Therapy: WFL for tasks assessed/performed Overall Cognitive Status: Within Functional Limits for tasks assessed                                        General Comments      Exercises     Assessment/Plan    PT Assessment Patient needs continued PT services  PT  Problem List Decreased strength;Decreased mobility;Decreased activity tolerance;Decreased balance;Decreased knowledge of use of DME;Pain       PT Treatment Interventions DME instruction;Gait training;Therapeutic exercise;Balance training;Functional mobility training;Therapeutic activities;Patient/family education    PT Goals (Current goals can be found in the Care Plan section)  Acute Rehab PT Goals Patient Stated Goal: home today PT Goal Formulation: With patient/family Time For Goal Achievement: 12/08/20 Potential to Achieve Goals: Good    Frequency Min 3X/week   Barriers to discharge         Co-evaluation               AM-PAC PT "6 Clicks" Mobility  Outcome Measure Help needed turning from your back to your side while in a flat bed without using bedrails?: A Little Help needed moving from lying on your back to sitting on the side of a flat bed without using bedrails?: A Little Help needed moving to and from a bed to a chair (including a wheelchair)?: A Little Help needed standing up from a chair using your arms (e.g., wheelchair or bedside chair)?: A Little Help needed to walk in hospital room?: A Little Help needed climbing 3-5 steps with a railing? : A Lot 6 Click Score: 17    End of Session Equipment Utilized During Treatment: Gait belt Activity Tolerance: Patient tolerated treatment well Patient left: in chair;with call bell/phone within reach;with chair alarm set;with family/visitor present   PT Visit Diagnosis: Unsteadiness on feet (R26.81);Muscle weakness (generalized) (M62.81);Pain Pain - part of body: Knee (bil)    Time: 9977-4142 PT Time Calculation (min) (ACUTE ONLY): 16 min   Charges:                 Doreatha Massed, PT Acute Rehabilitation  Office: [phone] Pager: [phone]

## 2020-11-24 NOTE — Progress Notes (Signed)
Pt discharged to home in stable condition accompanied by wife and son. Pt and family deny further questions or concerns at this time.  Coolidge Breeze, RN 11/24/2020

## 2020-11-24 NOTE — TOC Transition Note (Addendum)
Transition of Care [facility]) - CM/SW Discharge Note   Patient Details  Name: Remijio Holleran MRN: [id number] Date of Birth: 29-Nov-1932  [facility]) CM/SW Contact:  Ross Ludwig, LCSW Phone Number: 11/24/2020, 3:21 PM   Clinical Narrative:     CSW spoke to patient's wife to see if they are interested in [facility] PT, at first patient said no, but then he changed his mind and decided on it.  CSW asked if they have ever had it, and she said no.  CSW then asked if she had a preference, and she stated no.  CSW was able to contact [facility], and they do have availability.  CSW updated patient's wife.  Patient will be going home with home health through [facility].  CSW signing off please reconsult with any other social work needs, home health agency has been notified of planned discharge.   Final next level of care: [place] Barriers to Discharge: Barriers Resolved   Patient Goals and CMS Choice Patient states their goals for this hospitalization and ongoing recovery are:: To return home with home health services. CMS Medicare.gov Compare Post Acute Care list provided to:: Patient Represenative (must comment) Choice offered to / list presented to : Spouse  Discharge Placement  Patient discharging home with home health services through [facility].                     Discharge Plan and Services                          HH Arranged: PT [place] Agency: [place] Date [facility] Agency Contacted: 11/24/20 Time [place]: [id number] Representative spoke with at [place]: [place] ([place]) Interventions     Readmission Risk Interventions No flowsheet data found.

## 2020-11-24 NOTE — Care Management Obs Status (Signed)
[place] NOTIFICATION   Patient Details  Name: Luke [name] MRN: [id number] Date of Birth: 1932/12/29   Medicare Observation Status Notification Given:  Yes    Cecil Cobbs 11/24/2020, 3:32 PM

## 2020-11-24 NOTE — Discharge Summary (Addendum)
Physician Discharge Summary  Luke [name] LPF:[id number] DOB: 01-05-1933 DOA: 11/23/2020  PCP: Aretta Nip, MD  Admit date: 11/23/2020 Discharge date: 11/24/2020  Recommendations for Outpatient Follow-up:   Hypoglycemia at home, diabetes mellitus type 2 on insulin -- Multiple lows at home.  We will reduce long-acting insulin and meal coverage.  Close follow-up with endocrinologist.  CAD, chronic diastolic CHF Sinus bradycardia --Stop beta-blocker, see below.  Right Carotid: Velocities in the right ICA are consistent with a 40-59%         stenosis. --characterized as asymptomatic     Follow-up Information    Rankins, Bill Salinas, MD. Go on 12/04/2020.   Specialty: Family Medicine Why: 2 pm Contact information: [address] [place] [address] [phone]        Altheimer, Legrand Como, MD. Schedule an appointment as soon as possible for a visit in 1 week(s).   Specialty: Endocrinology Contact information: [address] [place]  [address] [phone]                Discharge Diagnoses: Principal diagnosis is #1 Principal Problem:   Hypoglycemia Active Problems:   DM (diabetes mellitus) ([facility])   Coronary atherosclerosis of native coronary artery   Mixed hyperlipidemia   Chronic diastolic heart failure (HCC)   Essential hypertension, benign   COPD (chronic obstructive pulmonary disease) (HCC)   Primary hypothyroidism   Vitamin D deficiency   Falls   Syncope and collapse   COPD with acute exacerbation ([facility])   Discharge Condition: improved Disposition: home  Diet recommendation:  Diet Orders (From admission, onward)    Start     Ordered   11/24/20 0000  Diet - low sodium heart healthy        11/24/20 1356   11/24/20 0000  Diet Carb Modified        11/24/20 1356   11/23/20 0658  Diet Carb Modified Fluid consistency: Thin; Room service appropriate? Yes  Diet effective now       Question Answer Comment  Diet-HS Snack?  Nothing   Calorie Level Medium 1600-2000   Fluid consistency: Thin   Room service appropriate? Yes      11/23/20 0659           Filed Weights   11/23/20 0247  Weight: 111.6 kg    HPI/Hospital Course:   87 year old man presented after a fall at home, found to be hypoglycemic at home, patient reported taking too much insulin.  Was also found to be hypoxic.  Admitted for fall at home, rule out syncope, hypoglycemia, mild exacerbation COPD.  Condition rapidly improved.  Further in-depth history obtained from patient.  He frequently experiences hypoglycemia, reports compliance with his insulin.  He has actually been adjusting his insulin dosing because of lows.  He also recently restarted his beta-blocker.  His work-up in the hospital was relatively unrevealing.  Echocardiogram was reassuring.  Telemetry showed bradycardia at times.  Carotid ultrasound was unrevealing.  He had no signs or symptoms to suggest ACS or acute neurologic event.  I suspect his falls, passing out multifactorial, predominantly hypoglycemia, probably exacerbated by bradycardia secondary to beta-blocker, and masking of hypoglycemia by same.  He is stable for discharge, will decrease his insulin dosing and have him follow-up with his endocrinologist.  We will also stop his beta-blocker for now.  Individual issues as below.  Hypoglycemia at home, diabetes mellitus type 2 on insulin -- Multiple lows at home.  We will reduce long-acting insulin and meal  coverage.  Close follow-up with endocrinologist.  Fall at home, syncope.   --Troponin was mildly elevated but EKG nonacute.  Without specific signs or symptoms to suggest ACS, interpret troponin as trivial and clinically not relevant.  No further evaluation suggested from that standpoint. -- Echocardiogram reassuring.  Carotid ultrasound with mild stenosis, noncontributory.  At this point I do not see indication for cardiology consultation or Holter.   --Stop beta-blocker and  follow-up with PCP as an outpatient. --PT evaluation.  Home health PT recommended.  Sinus bradycardia --Stop beta-blocker as above. --Continue aspirin, statin  Mild COPD exacerbation with acute hypoxic respiratory failure -- Hypoxia resolved, etiology unclear but I suspect related to his COPD.  His exam was unimpressive and I doubt that he had a significant COPD exacerbation and I do not think steroids on discharge will be of benefit.  CKD stage III --At baseline  CAD, chronic diastolic CHF --asymptomatic  Hypothyroidism --Continue levothyroxine.  Significant Hospital Events    5/1 admit for hypoglycemia  Consults:   None  Procedures:   None  Significant Diagnostic Tests:   CT head/neck no acute changes  CXR NAD  Echo as below  Carotid u/s as below  Micro Data:   COVID negative  Antimicrobials:   None   Today's assessment: S: CC: f/u hypoglycemia  Feels much better today Reports has CBG lows frequently at home Recently restarted metoprolol Only 2 falls recently  O: Vitals:  Vitals:   11/24/20 0911 11/24/20 1255  BP: (!) 131/46 140/67  Pulse: 70 77  Resp: 18 20  Temp: 98.2 F (36.8 C) 98.1 F (36.7 C)  SpO2: 94% 90%    Constitutional:  . Appears calm and comfortable Respiratory:  . CTA bilaterally, no w/r/r.  . Respiratory effort normal.  Cardiovascular:  . RRR, no m/r/g . telemetry SR, SB Psychiatric:  . judgement and insight appear normal . Mental status o Mood, affect appropriate  CBG stable Creatinine at baseline/better Echo noted Carotid u/s noted  Discharge Instructions  Discharge Instructions    Diet - low sodium heart healthy   Complete by: As directed    Diet Carb Modified   Complete by: As directed    Discharge instructions   Complete by: As directed    Call your physician or seek immediate medical attention for falls, confusion, blood sugar less than 70 or greater than 400, dizziness.   Increase activity  slowly   Complete by: As directed      Allergies as of 11/24/2020   No Known Allergies     Medication List    STOP taking these medications   amLODipine 5 MG tablet Commonly known as: NORVASC   atenolol 25 MG tablet Commonly known as: TENORMIN     TAKE these medications   acetaminophen 500 MG tablet Commonly known as: TYLENOL Take 1,000 mg by mouth every 6 (six) hours as needed for mild pain.   albuterol 108 (90 Base) MCG/ACT inhaler Commonly known as: VENTOLIN HFA Inhale 2 puffs into the lungs every 4 (four) hours as needed for wheezing or shortness of breath.   aspirin EC 81 MG tablet Take 1 tablet (81 mg total) by mouth daily.   atorvastatin 20 MG tablet Commonly known as: LIPITOR Take 1 tablet (20 mg total) by mouth daily. Please make overdue appt with Dr. Irish Lack before anymore refills. Thank you 1st attempt   furosemide 80 MG tablet Commonly known as: LASIX Take 2 tablets by mouth in the a.m. and 1  tablet by mouth in the p.m.   insulin aspart 100 UNIT/ML injection Commonly known as: novoLOG Inject 10 Units into the skin 2 (two) times daily before a meal. What changed: how much to take   insulin detemir 100 UNIT/ML injection Commonly known as: LEVEMIR Inject 0.15 mLs (15 Units total) into the skin 2 (two) times daily. What changed: how much to take   isosorbide mononitrate 60 MG 24 hr tablet Commonly known as: IMDUR TAKE 1 TABLET BY MOUTH EVERY DAY   levothyroxine 75 MCG tablet Commonly known as: SYNTHROID Take 75 mcg by mouth daily.   multivitamin capsule Take 1 capsule by mouth daily.   nitroGLYCERIN 0.4 MG SL tablet Commonly known as: NITROSTAT DISSOLVE 1 TABLET UNDER THE TONGUE EVERY 5 MINUTES AS NEEDED FOR CHEST PAIN What changed: See the new instructions.   polyvinyl alcohol 1.4 % ophthalmic solution Commonly known as: LIQUIFILM TEARS Place 1 drop into both eyes as needed (For dry eyes).   umeclidinium-vilanterol 62.5-25 MCG/INH  Aepb Commonly known as: ANORO ELLIPTA Inhale 1 puff into the lungs daily.      No Known Allergies  The results of significant diagnostics from this hospitalization (including imaging, microbiology, ancillary and laboratory) are listed below for reference.    Significant Diagnostic Studies: CT Head Wo Contrast  Result Date: 11/23/2020 CLINICAL DATA:  Unwitnessed fall with head strike EXAM: CT HEAD WITHOUT CONTRAST CT CERVICAL SPINE WITHOUT CONTRAST TECHNIQUE: Multidetector CT imaging of the head and cervical spine was performed following the standard protocol without intravenous contrast. Multiplanar CT image reconstructions of the cervical spine were also generated. COMPARISON:  CT head 09/21/2009 FINDINGS: CT HEAD FINDINGS Brain: Motion degraded imaging. No evidence of acute infarction, hemorrhage, hydrocephalus, extra-axial collection, visible mass lesion or mass effect. Symmetric prominence of the ventricles, cisterns and sulci compatible with parenchymal volume loss. Patchy areas of white matter hypoattenuation are most compatible with chronic microvascular angiopathy. Scattered dural calcifications and a general distribution similar to comparison CT. Vascular: Atherosclerotic calcification of the carotid siphons and intradural vertebral arteries. No hyperdense vessel. Skull: Some right occipital scalp stranding appears chronic and may be related to prior scarring. No new acute scalp swelling or hematoma. No visible calvarial fracture within the limitations of this motion degraded exam. No visible facial bone fracture or other acute osseous injury within the margins of imaging. Sinuses/Orbits: Mural thickening in the maxillary sinus with some mild atelectatic changes of the sinus itself. Likely reflecting chronicity. No layering air-fluid levels or pneumatized secretions. Mastoid air cells and middle ear cavities are clear. Other: Edentulous with mandibular prognathism seen on scout view. CT CERVICAL  SPINE FINDINGS Alignment: Slightly motion degraded imaging despite multiple attempts at acquisition. Straightening and slight reversal the normal cervical lordosis centered at the C5-6 level. Slight levocurvature of the cervical spine as well centered C4-5. No evidence of traumatic listhesis. No abnormally widened, perched or jumped facets. Bony fusion across the C2-C4 posterior facets. Normal alignment of the craniocervical and atlantoaxial articulations. Skull base and vertebrae: Bony fusion across the C2-C4 posterior articular facets, as described above. No discernible acute skull base or vertebral body fracture is seen. Multilevel cervical spondylitic changes as detailed below. Additional arthrosis at the atlantodental and basion dens intervals Soft tissues and spinal canal: No pre or paravertebral fluid or swelling. No visible canal hematoma. Airways patent. Disc levels: Multilevel intervertebral disc height loss with spondylitic endplate changes. Posterior disc osteophyte complexes are most pronounced C5-6, C6-7 with some mild resulting canal stenosis. Additional  multilevel uncinate spurring and facet hypertrophic changes are present as well resulting in mild-to-moderate multilevel neural foraminal narrowing. Upper chest: No acute abnormality in the upper chest or imaged lung apices. Cervical carotid atherosclerosis. Other: No concerning thyroid nodules or masses. IMPRESSION: 1. Motion degradation of the imaging despite multiple attempts at acquisition. 2. No acute intracranial abnormality. No acute scalp swelling, hematoma or calvarial fracture. 3. Background of parenchymal volume loss, microvascular angiopathy, and intracranial atherosclerosis. 4. No acute cervical spine fracture or traumatic listhesis. 5. Multilevel cervical spondylitic changes as detailed above. Electronically Signed   By: Lovena Le M.D.   On: 11/23/2020 04:22   CT Cervical Spine Wo Contrast  Result Date: 11/23/2020 CLINICAL DATA:   Unwitnessed fall with head strike EXAM: CT HEAD WITHOUT CONTRAST CT CERVICAL SPINE WITHOUT CONTRAST TECHNIQUE: Multidetector CT imaging of the head and cervical spine was performed following the standard protocol without intravenous contrast. Multiplanar CT image reconstructions of the cervical spine were also generated. COMPARISON:  CT head 09/21/2009 FINDINGS: CT HEAD FINDINGS Brain: Motion degraded imaging. No evidence of acute infarction, hemorrhage, hydrocephalus, extra-axial collection, visible mass lesion or mass effect. Symmetric prominence of the ventricles, cisterns and sulci compatible with parenchymal volume loss. Patchy areas of white matter hypoattenuation are most compatible with chronic microvascular angiopathy. Scattered dural calcifications and a general distribution similar to comparison CT. Vascular: Atherosclerotic calcification of the carotid siphons and intradural vertebral arteries. No hyperdense vessel. Skull: Some right occipital scalp stranding appears chronic and may be related to prior scarring. No new acute scalp swelling or hematoma. No visible calvarial fracture within the limitations of this motion degraded exam. No visible facial bone fracture or other acute osseous injury within the margins of imaging. Sinuses/Orbits: Mural thickening in the maxillary sinus with some mild atelectatic changes of the sinus itself. Likely reflecting chronicity. No layering air-fluid levels or pneumatized secretions. Mastoid air cells and middle ear cavities are clear. Other: Edentulous with mandibular prognathism seen on scout view. CT CERVICAL SPINE FINDINGS Alignment: Slightly motion degraded imaging despite multiple attempts at acquisition. Straightening and slight reversal the normal cervical lordosis centered at the C5-6 level. Slight levocurvature of the cervical spine as well centered C4-5. No evidence of traumatic listhesis. No abnormally widened, perched or jumped facets. Bony fusion across  the C2-C4 posterior facets. Normal alignment of the craniocervical and atlantoaxial articulations. Skull base and vertebrae: Bony fusion across the C2-C4 posterior articular facets, as described above. No discernible acute skull base or vertebral body fracture is seen. Multilevel cervical spondylitic changes as detailed below. Additional arthrosis at the atlantodental and basion dens intervals Soft tissues and spinal canal: No pre or paravertebral fluid or swelling. No visible canal hematoma. Airways patent. Disc levels: Multilevel intervertebral disc height loss with spondylitic endplate changes. Posterior disc osteophyte complexes are most pronounced C5-6, C6-7 with some mild resulting canal stenosis. Additional multilevel uncinate spurring and facet hypertrophic changes are present as well resulting in mild-to-moderate multilevel neural foraminal narrowing. Upper chest: No acute abnormality in the upper chest or imaged lung apices. Cervical carotid atherosclerosis. Other: No concerning thyroid nodules or masses. IMPRESSION: 1. Motion degradation of the imaging despite multiple attempts at acquisition. 2. No acute intracranial abnormality. No acute scalp swelling, hematoma or calvarial fracture. 3. Background of parenchymal volume loss, microvascular angiopathy, and intracranial atherosclerosis. 4. No acute cervical spine fracture or traumatic listhesis. 5. Multilevel cervical spondylitic changes as detailed above. Electronically Signed   By: Lovena Le M.D.   On: 11/23/2020 04:22  DG Chest Portable 1 View  Result Date: 11/23/2020 CLINICAL DATA:  Status post fall. EXAM: PORTABLE CHEST 1 VIEW COMPARISON:  August 30, 2017 FINDINGS: Multiple sternal wires and vascular clips are seen. There is no evidence of acute infiltrate, pleural effusion or pneumothorax. The heart size and mediastinal contours are within normal limits. The visualized skeletal structures are unremarkable. IMPRESSION: No active  cardiopulmonary disease. Electronically Signed   By: Virgina Norfolk M.D.   On: 11/23/2020 03:41   ECHOCARDIOGRAM COMPLETE  Result Date: 11/23/2020    ECHOCARDIOGRAM REPORT   Patient Name:   Luke [name] Date of Exam: 11/23/2020 Medical Rec #:  [id number]      Height:       71.0 in Accession #:    [id number]     Weight:       246.0 lb Date of Birth:  09-09-1932      BSA:          2.303 m Patient Age:    56 years       BP:           158/57 mmHg Patient Gender: M              HR:           93 bpm. Exam Location:  Inpatient Procedure: 2D Echo, Color Doppler and Cardiac Doppler Indications:    R55 Syncope  History:        Patient has prior history of Echocardiogram examinations, most                 recent 12/05/2017. CAD, Stroke and COPD, Signs/Symptoms:Syncope;                 Risk Factors:Diabetes, Hypertension and Dyslipidemia.  Sonographer:    Bernadene Person RDCS Referring Phys: [place]  1. Left ventricular ejection fraction, by estimation, is 55 to 60%. The left ventricle has normal function. Difficult images for wall motion, possible septal hypokinesis. Contrast was not used but may have been helpful. There is mild left ventricular hypertrophy. Left ventricular diastolic parameters are consistent with Grade I diastolic dysfunction (impaired relaxation).  2. Right ventricular systolic function is normal. The right ventricular size is normal. Tricuspid regurgitation signal is inadequate for assessing PA pressure.  3. The mitral valve is normal in structure. Trivial mitral valve regurgitation. No evidence of mitral stenosis.  4. The aortic valve is tricuspid. Aortic valve regurgitation is not visualized. Mild aortic valve sclerosis is present, with no evidence of aortic valve stenosis.  5. Aortic dilatation noted. There is mild dilatation of the aortic root, measuring 37 mm.  6. IVC not well-visualized. FINDINGS  Left Ventricle: Left ventricular ejection fraction, by estimation, is 55 to  60%. The left ventricle has normal function. The left ventricle demonstrates regional wall motion abnormalities. The left ventricular internal cavity size was normal in size. There is mild left ventricular hypertrophy. Left ventricular diastolic parameters are consistent with Grade I diastolic dysfunction (impaired relaxation). Right Ventricle: The right ventricular size is normal. No increase in right ventricular wall thickness. Right ventricular systolic function is normal. Tricuspid regurgitation signal is inadequate for assessing PA pressure. Left Atrium: Left atrial size was normal in size. Right Atrium: Right atrial size was normal in size. Pericardium: There is no evidence of pericardial effusion. Mitral Valve: The mitral valve is normal in structure. Mild mitral annular calcification. Trivial mitral valve regurgitation. No evidence of mitral valve stenosis. Tricuspid Valve: The tricuspid valve is  normal in structure. Tricuspid valve regurgitation is not demonstrated. Aortic Valve: The aortic valve is tricuspid. Aortic valve regurgitation is not visualized. Mild aortic valve sclerosis is present, with no evidence of aortic valve stenosis. Pulmonic Valve: The pulmonic valve was not well visualized. Pulmonic valve regurgitation is not visualized. Aorta: Aortic dilatation noted. There is mild dilatation of the aortic root, measuring 37 mm. Venous: The inferior vena cava was not well visualized. IAS/Shunts: No atrial level shunt detected by color flow Doppler.  LEFT VENTRICLE PLAX 2D LVIDd:         4.20 cm LVIDs:         3.30 cm LV PW:         1.10 cm LV IVS:        1.30 cm LVOT diam:     2.10 cm LV SV:         81 LV SV Index:   35 LVOT Area:     3.46 cm  RIGHT VENTRICLE TAPSE (M-mode): 1.6 cm LEFT ATRIUM             Index       RIGHT ATRIUM           Index LA diam:        3.20 cm 1.39 cm/m  RA Area:     15.40 cm LA Vol (A2C):   44.9 ml 19.50 ml/m RA Volume:   35.60 ml  15.46 ml/m LA Vol (A4C):   51.0 ml  22.15 ml/m LA Biplane Vol: 49.8 ml 21.63 ml/m  AORTIC VALVE LVOT Vmax:   117.67 cm/s LVOT Vmean:  83.067 cm/s LVOT VTI:    0.234 m  AORTA Ao Root diam: 3.70 cm Ao Asc diam:  3.40 cm  SHUNTS Systemic VTI:  0.23 m Systemic Diam: 2.10 cm Loralie Champagne MD Electronically signed by Loralie Champagne MD Signature Date/Time: 11/23/2020/1:16:17 PM    Final    VAS US CAROTID  Result Date: 11/23/2020 Carotid Arterial Duplex Study Patient Name:  Luke [name]  Date of Exam:   11/23/2020 Medical Rec #: [id number]       Accession #:    [id number] Date of Birth: [birth year]       Patient Gender: M Patient Age:   87Y Exam Location:  [facility] Procedure:      VAS US CAROTID Referring Phys: [id number] EKTA V PATEL --------------------------------------------------------------------------------  Indications:       Syncope. Risk Factors:      Hypertension, hyperlipidemia, Diabetes. Limitations        Today's exam was limited due to the body habitus of the                    patient and the patient's respiratory variation. Comparison Study:  No prior studies. Performing Technologist: Oliver Hum RVT  Examination Guidelines: A complete evaluation includes B-mode imaging, spectral Doppler, color Doppler, and power Doppler as needed of all accessible portions of each vessel. Bilateral testing is considered an integral part of a complete examination. Limited examinations for reoccurring indications may be performed as noted.  Right Carotid Findings: +----------+--------+--------+--------+-----------------------+--------+           PSV cm/sEDV cm/sStenosisPlaque Description     Comments +----------+--------+--------+--------+-----------------------+--------+ CCA Prox  61      10              smooth and heterogenous         +----------+--------+--------+--------+-----------------------+--------+ CCA Distal69      11  smooth and heterogenous          +----------+--------+--------+--------+-----------------------+--------+ ICA Prox  215     46              smooth and heterogenous         +----------+--------+--------+--------+-----------------------+--------+ ICA Distal110     21                                     tortuous +----------+--------+--------+--------+-----------------------+--------+ ECA       147     10                                              +----------+--------+--------+--------+-----------------------+--------+ +----------+--------+-------+--------+-------------------+           PSV cm/sEDV cmsDescribeArm Pressure (mmHG) +----------+--------+-------+--------+-------------------+ Subclavian230                                        +----------+--------+-------+--------+-------------------+ +---------+--------+--+--------+--+---------+ VertebralPSV cm/s56EDV cm/s11Antegrade +---------+--------+--+--------+--+---------+  Left Carotid Findings: +----------+--------+--------+--------+--------------------------+--------+           PSV cm/sEDV cm/sStenosisPlaque Description        Comments +----------+--------+--------+--------+--------------------------+--------+ CCA Prox  90      17              heterogenous and irregular         +----------+--------+--------+--------+--------------------------+--------+ CCA Distal92      13              irregular and heterogenous         +----------+--------+--------+--------+--------------------------+--------+ ICA Prox  90      18              smooth and heterogenous            +----------+--------+--------+--------+--------------------------+--------+ ICA Distal104     29                                        tortuous +----------+--------+--------+--------+--------------------------+--------+ ECA       108     12                                                  +----------+--------+--------+--------+--------------------------+--------+ +----------+--------+--------+--------+-------------------+           PSV cm/sEDV cm/sDescribeArm Pressure (mmHG) +----------+--------+--------+--------+-------------------+ [id number]                                         +----------+--------+--------+--------+-------------------+ +---------+--------+--+--------+-+---------+ VertebralPSV cm/s33EDV cm/s8Antegrade +---------+--------+--+--------+-+---------+   Summary: Right Carotid: Velocities in the right ICA are consistent with a 40-59%                stenosis. Left Carotid: Velocities in the left ICA are consistent with a 1-39% stenosis. Vertebrals: Bilateral vertebral arteries demonstrate antegrade flow. *See table(s) above for measurements and observations.  Electronically signed by Ruta Hinds MD on 11/23/2020 at 2:29:30 PM.    Final  Microbiology: Recent Results (from the past 240 hour(s))  Resp Panel by RT-PCR (Flu A&B, Covid) Nasopharyngeal Swab     Status: None   Collection Time: 11/23/20  4:50 AM   Specimen: Nasopharyngeal Swab; Nasopharyngeal(NP) swabs in vial transport medium  Result Value Ref Range Status   SARS Coronavirus 2 by RT PCR NEGATIVE NEGATIVE Final    Comment: (NOTE) SARS-CoV-2 target nucleic acids are NOT DETECTED.  The SARS-CoV-2 RNA is generally detectable in upper respiratory specimens during the acute phase of infection. The lowest concentration of SARS-CoV-2 viral copies this assay can detect is 138 copies/mL. A negative result does not preclude SARS-Cov-2 infection and should not be used as the sole basis for treatment or other patient management decisions. A negative result may occur with  improper specimen collection/handling, submission of specimen other than nasopharyngeal swab, presence of viral mutation(s) within the areas targeted by this assay, and inadequate number of viral copies(<138 copies/mL). A  negative result must be combined with clinical observations, patient history, and epidemiological information. The expected result is Negative.  Fact Sheet for Patients:  EntrepreneurPulse.com.au  Fact Sheet for Healthcare Providers:  IncredibleEmployment.be  This test is no t yet approved or cleared by the [place] FDA and  has been authorized for detection and/or diagnosis of SARS-CoV-2 by FDA under an Emergency Use Authorization (EUA). This EUA will remain  in effect (meaning this test can be used) for the duration of the COVID-19 declaration under Section 564(b)(1) of the Act, 21 U.S.C.section 360bbb-3(b)(1), unless the authorization is terminated  or revoked sooner.       Influenza A by PCR NEGATIVE NEGATIVE Final   Influenza B by PCR NEGATIVE NEGATIVE Final    Comment: (NOTE) The Xpert Xpress SARS-CoV-2/FLU/RSV plus assay is intended as an aid in the diagnosis of influenza from Nasopharyngeal swab specimens and should not be used as a sole basis for treatment. Nasal washings and aspirates are unacceptable for Xpert Xpress SARS-CoV-2/FLU/RSV testing.  Fact Sheet for Patients: EntrepreneurPulse.com.au  Fact Sheet for Healthcare Providers: IncredibleEmployment.be  This test is not yet approved or cleared by the [place] FDA and has been authorized for detection and/or diagnosis of SARS-CoV-2 by FDA under an Emergency Use Authorization (EUA). This EUA will remain in effect (meaning this test can be used) for the duration of the COVID-19 declaration under Section 564(b)(1) of the Act, 21 U.S.C. section 360bbb-3(b)(1), unless the authorization is terminated or revoked.  Performed at [facility], [place] [address] [place], [place] [address]      Labs: Basic Metabolic Panel: Recent Labs  Lab 11/23/20 0322 11/24/20 0327  NA 139 136  K 4.4 4.9  CL 101 98  CO2  --  28   GLUCOSE 164* 180*  BUN 41* 43*  CREATININE 1.60* 1.45*  CALCIUM  --  8.8*   Liver Function Tests: Recent Labs  Lab 11/24/20 0327  AST 25  ALT 18  ALKPHOS 62  BILITOT 0.6  PROT 6.5  ALBUMIN 3.6   CBC: Recent Labs  Lab 11/23/20 0322 11/23/20 0340  WBC  --  9.8  NEUTROABS  --  8.4*  HGB 14.6 14.8  HCT 43.0 46.0  MCV  --  94.5  PLT  --  178    Recent Labs    11/23/20 0340  BNP 11.5   CBG: Recent Labs  Lab 11/23/20 1210 11/23/20 1634 11/23/20 2133 11/24/20 0728 11/24/20 1153  GLUCAP 300* 320* 265* 147* 198*    Principal  Problem:   Hypoglycemia Active Problems:   DM (diabetes mellitus) ([place])   Coronary atherosclerosis of native coronary artery   Mixed hyperlipidemia   Chronic diastolic heart failure (HCC)   Essential hypertension, benign   COPD (chronic obstructive pulmonary disease) (HCC)   Primary hypothyroidism   Vitamin D deficiency   Falls   Syncope and collapse   COPD with acute exacerbation ([place])   Time coordinating discharge: 40 minutes  Signed:  Murray Hodgkins, MD  [facility]  11/24/2020, 2:03 PM

## 2020-11-24 NOTE — Care Management CC44 (Signed)
Condition Code 44 Documentation Completed  Patient Details  Name: Luke [name] MRN: [id number] Date of Birth: 11-Mar-1933   Condition Code 44 given:  Yes Patient signature on Condition Code 44 notice:  Yes Documentation of 2 MD's agreement:  Yes Code 44 added to claim:  Yes    Ross Ludwig, LCSW 11/24/2020, 3:31 PM

## 2020-11-26 DIAGNOSIS — I10 Essential (primary) hypertension: Secondary | ICD-10-CM | POA: Diagnosis not present

## 2020-11-26 DIAGNOSIS — E7800 Pure hypercholesterolemia, unspecified: Secondary | ICD-10-CM | POA: Diagnosis not present

## 2020-11-26 DIAGNOSIS — N183 Chronic kidney disease, stage 3 unspecified: Secondary | ICD-10-CM | POA: Diagnosis not present

## 2020-11-26 DIAGNOSIS — E039 Hypothyroidism, unspecified: Secondary | ICD-10-CM | POA: Diagnosis not present

## 2020-11-26 DIAGNOSIS — E1121 Type 2 diabetes mellitus with diabetic nephropathy: Secondary | ICD-10-CM | POA: Diagnosis not present

## 2020-11-26 DIAGNOSIS — Z794 Long term (current) use of insulin: Secondary | ICD-10-CM | POA: Diagnosis not present

## 2020-11-26 DIAGNOSIS — E11649 Type 2 diabetes mellitus with hypoglycemia without coma: Secondary | ICD-10-CM | POA: Diagnosis not present

## 2020-11-28 ENCOUNTER — Other Ambulatory Visit: Payer: Self-pay | Admitting: Interventional Cardiology

## 2020-12-01 DIAGNOSIS — E039 Hypothyroidism, unspecified: Secondary | ICD-10-CM | POA: Diagnosis not present

## 2020-12-01 DIAGNOSIS — Z7982 Long term (current) use of aspirin: Secondary | ICD-10-CM | POA: Diagnosis not present

## 2020-12-01 DIAGNOSIS — I5032 Chronic diastolic (congestive) heart failure: Secondary | ICD-10-CM | POA: Diagnosis not present

## 2020-12-01 DIAGNOSIS — E782 Mixed hyperlipidemia: Secondary | ICD-10-CM | POA: Diagnosis not present

## 2020-12-01 DIAGNOSIS — J441 Chronic obstructive pulmonary disease with (acute) exacerbation: Secondary | ICD-10-CM | POA: Diagnosis not present

## 2020-12-01 DIAGNOSIS — Z794 Long term (current) use of insulin: Secondary | ICD-10-CM | POA: Diagnosis not present

## 2020-12-01 DIAGNOSIS — R001 Bradycardia, unspecified: Secondary | ICD-10-CM | POA: Diagnosis not present

## 2020-12-01 DIAGNOSIS — I130 Hypertensive heart and chronic kidney disease with heart failure and stage 1 through stage 4 chronic kidney disease, or unspecified chronic kidney disease: Secondary | ICD-10-CM | POA: Diagnosis not present

## 2020-12-01 DIAGNOSIS — E559 Vitamin D deficiency, unspecified: Secondary | ICD-10-CM | POA: Diagnosis not present

## 2020-12-01 DIAGNOSIS — E11649 Type 2 diabetes mellitus with hypoglycemia without coma: Secondary | ICD-10-CM | POA: Diagnosis not present

## 2020-12-01 DIAGNOSIS — Z9181 History of falling: Secondary | ICD-10-CM | POA: Diagnosis not present

## 2020-12-01 DIAGNOSIS — R55 Syncope and collapse: Secondary | ICD-10-CM | POA: Diagnosis not present

## 2020-12-01 DIAGNOSIS — N183 Chronic kidney disease, stage 3 unspecified: Secondary | ICD-10-CM | POA: Diagnosis not present

## 2020-12-01 DIAGNOSIS — I2510 Atherosclerotic heart disease of native coronary artery without angina pectoris: Secondary | ICD-10-CM | POA: Diagnosis not present

## 2020-12-01 DIAGNOSIS — I6521 Occlusion and stenosis of right carotid artery: Secondary | ICD-10-CM | POA: Diagnosis not present

## 2020-12-02 DIAGNOSIS — E1122 Type 2 diabetes mellitus with diabetic chronic kidney disease: Secondary | ICD-10-CM | POA: Diagnosis not present

## 2020-12-02 DIAGNOSIS — E039 Hypothyroidism, unspecified: Secondary | ICD-10-CM | POA: Diagnosis not present

## 2020-12-02 DIAGNOSIS — E160 Drug-induced hypoglycemia without coma: Secondary | ICD-10-CM | POA: Diagnosis not present

## 2020-12-02 DIAGNOSIS — T383X5A Adverse effect of insulin and oral hypoglycemic [antidiabetic] drugs, initial encounter: Secondary | ICD-10-CM | POA: Diagnosis not present

## 2020-12-02 DIAGNOSIS — I129 Hypertensive chronic kidney disease with stage 1 through stage 4 chronic kidney disease, or unspecified chronic kidney disease: Secondary | ICD-10-CM | POA: Diagnosis not present

## 2020-12-02 DIAGNOSIS — E782 Mixed hyperlipidemia: Secondary | ICD-10-CM | POA: Diagnosis not present

## 2020-12-02 DIAGNOSIS — E1165 Type 2 diabetes mellitus with hyperglycemia: Secondary | ICD-10-CM | POA: Diagnosis not present

## 2020-12-02 DIAGNOSIS — N1831 Chronic kidney disease, stage 3a: Secondary | ICD-10-CM | POA: Diagnosis not present

## 2020-12-02 DIAGNOSIS — Z794 Long term (current) use of insulin: Secondary | ICD-10-CM | POA: Diagnosis not present

## 2020-12-03 DIAGNOSIS — E11649 Type 2 diabetes mellitus with hypoglycemia without coma: Secondary | ICD-10-CM | POA: Diagnosis not present

## 2020-12-03 DIAGNOSIS — N183 Chronic kidney disease, stage 3 unspecified: Secondary | ICD-10-CM | POA: Diagnosis not present

## 2020-12-03 DIAGNOSIS — R55 Syncope and collapse: Secondary | ICD-10-CM | POA: Diagnosis not present

## 2020-12-03 DIAGNOSIS — J441 Chronic obstructive pulmonary disease with (acute) exacerbation: Secondary | ICD-10-CM | POA: Diagnosis not present

## 2020-12-03 DIAGNOSIS — I5032 Chronic diastolic (congestive) heart failure: Secondary | ICD-10-CM | POA: Diagnosis not present

## 2020-12-03 DIAGNOSIS — I130 Hypertensive heart and chronic kidney disease with heart failure and stage 1 through stage 4 chronic kidney disease, or unspecified chronic kidney disease: Secondary | ICD-10-CM | POA: Diagnosis not present

## 2020-12-10 DIAGNOSIS — N183 Chronic kidney disease, stage 3 unspecified: Secondary | ICD-10-CM | POA: Diagnosis not present

## 2020-12-10 DIAGNOSIS — E11649 Type 2 diabetes mellitus with hypoglycemia without coma: Secondary | ICD-10-CM | POA: Diagnosis not present

## 2020-12-10 DIAGNOSIS — J441 Chronic obstructive pulmonary disease with (acute) exacerbation: Secondary | ICD-10-CM | POA: Diagnosis not present

## 2020-12-10 DIAGNOSIS — I130 Hypertensive heart and chronic kidney disease with heart failure and stage 1 through stage 4 chronic kidney disease, or unspecified chronic kidney disease: Secondary | ICD-10-CM | POA: Diagnosis not present

## 2020-12-10 DIAGNOSIS — R55 Syncope and collapse: Secondary | ICD-10-CM | POA: Diagnosis not present

## 2020-12-10 DIAGNOSIS — I5032 Chronic diastolic (congestive) heart failure: Secondary | ICD-10-CM | POA: Diagnosis not present

## 2020-12-14 DIAGNOSIS — R55 Syncope and collapse: Secondary | ICD-10-CM | POA: Diagnosis not present

## 2020-12-14 DIAGNOSIS — N183 Chronic kidney disease, stage 3 unspecified: Secondary | ICD-10-CM | POA: Diagnosis not present

## 2020-12-14 DIAGNOSIS — I130 Hypertensive heart and chronic kidney disease with heart failure and stage 1 through stage 4 chronic kidney disease, or unspecified chronic kidney disease: Secondary | ICD-10-CM | POA: Diagnosis not present

## 2020-12-14 DIAGNOSIS — E11649 Type 2 diabetes mellitus with hypoglycemia without coma: Secondary | ICD-10-CM | POA: Diagnosis not present

## 2020-12-14 DIAGNOSIS — J441 Chronic obstructive pulmonary disease with (acute) exacerbation: Secondary | ICD-10-CM | POA: Diagnosis not present

## 2020-12-14 DIAGNOSIS — I5032 Chronic diastolic (congestive) heart failure: Secondary | ICD-10-CM | POA: Diagnosis not present

## 2020-12-16 DIAGNOSIS — I130 Hypertensive heart and chronic kidney disease with heart failure and stage 1 through stage 4 chronic kidney disease, or unspecified chronic kidney disease: Secondary | ICD-10-CM | POA: Diagnosis not present

## 2020-12-16 DIAGNOSIS — R55 Syncope and collapse: Secondary | ICD-10-CM | POA: Diagnosis not present

## 2020-12-16 DIAGNOSIS — N183 Chronic kidney disease, stage 3 unspecified: Secondary | ICD-10-CM | POA: Diagnosis not present

## 2020-12-16 DIAGNOSIS — E11649 Type 2 diabetes mellitus with hypoglycemia without coma: Secondary | ICD-10-CM | POA: Diagnosis not present

## 2020-12-16 DIAGNOSIS — I5032 Chronic diastolic (congestive) heart failure: Secondary | ICD-10-CM | POA: Diagnosis not present

## 2020-12-16 DIAGNOSIS — J441 Chronic obstructive pulmonary disease with (acute) exacerbation: Secondary | ICD-10-CM | POA: Diagnosis not present

## 2020-12-17 ENCOUNTER — Other Ambulatory Visit: Payer: Self-pay | Admitting: Interventional Cardiology

## 2020-12-22 DIAGNOSIS — R55 Syncope and collapse: Secondary | ICD-10-CM | POA: Diagnosis not present

## 2020-12-22 DIAGNOSIS — E11649 Type 2 diabetes mellitus with hypoglycemia without coma: Secondary | ICD-10-CM | POA: Diagnosis not present

## 2020-12-22 DIAGNOSIS — N183 Chronic kidney disease, stage 3 unspecified: Secondary | ICD-10-CM | POA: Diagnosis not present

## 2020-12-22 DIAGNOSIS — I5032 Chronic diastolic (congestive) heart failure: Secondary | ICD-10-CM | POA: Diagnosis not present

## 2020-12-22 DIAGNOSIS — I130 Hypertensive heart and chronic kidney disease with heart failure and stage 1 through stage 4 chronic kidney disease, or unspecified chronic kidney disease: Secondary | ICD-10-CM | POA: Diagnosis not present

## 2020-12-22 DIAGNOSIS — J441 Chronic obstructive pulmonary disease with (acute) exacerbation: Secondary | ICD-10-CM | POA: Diagnosis not present

## 2020-12-24 DIAGNOSIS — I130 Hypertensive heart and chronic kidney disease with heart failure and stage 1 through stage 4 chronic kidney disease, or unspecified chronic kidney disease: Secondary | ICD-10-CM | POA: Diagnosis not present

## 2020-12-24 DIAGNOSIS — E11649 Type 2 diabetes mellitus with hypoglycemia without coma: Secondary | ICD-10-CM | POA: Diagnosis not present

## 2020-12-24 DIAGNOSIS — R55 Syncope and collapse: Secondary | ICD-10-CM | POA: Diagnosis not present

## 2020-12-24 DIAGNOSIS — I5032 Chronic diastolic (congestive) heart failure: Secondary | ICD-10-CM | POA: Diagnosis not present

## 2020-12-24 DIAGNOSIS — J441 Chronic obstructive pulmonary disease with (acute) exacerbation: Secondary | ICD-10-CM | POA: Diagnosis not present

## 2020-12-24 DIAGNOSIS — N183 Chronic kidney disease, stage 3 unspecified: Secondary | ICD-10-CM | POA: Diagnosis not present

## 2020-12-25 DIAGNOSIS — I130 Hypertensive heart and chronic kidney disease with heart failure and stage 1 through stage 4 chronic kidney disease, or unspecified chronic kidney disease: Secondary | ICD-10-CM | POA: Diagnosis not present

## 2020-12-25 DIAGNOSIS — I5032 Chronic diastolic (congestive) heart failure: Secondary | ICD-10-CM | POA: Diagnosis not present

## 2020-12-25 DIAGNOSIS — J441 Chronic obstructive pulmonary disease with (acute) exacerbation: Secondary | ICD-10-CM | POA: Diagnosis not present

## 2020-12-25 DIAGNOSIS — R55 Syncope and collapse: Secondary | ICD-10-CM | POA: Diagnosis not present

## 2020-12-25 DIAGNOSIS — E11649 Type 2 diabetes mellitus with hypoglycemia without coma: Secondary | ICD-10-CM | POA: Diagnosis not present

## 2020-12-25 DIAGNOSIS — N183 Chronic kidney disease, stage 3 unspecified: Secondary | ICD-10-CM | POA: Diagnosis not present

## 2020-12-27 ENCOUNTER — Other Ambulatory Visit: Payer: Self-pay | Admitting: Interventional Cardiology

## 2020-12-29 DIAGNOSIS — N183 Chronic kidney disease, stage 3 unspecified: Secondary | ICD-10-CM | POA: Diagnosis not present

## 2020-12-29 DIAGNOSIS — R55 Syncope and collapse: Secondary | ICD-10-CM | POA: Diagnosis not present

## 2020-12-29 DIAGNOSIS — I130 Hypertensive heart and chronic kidney disease with heart failure and stage 1 through stage 4 chronic kidney disease, or unspecified chronic kidney disease: Secondary | ICD-10-CM | POA: Diagnosis not present

## 2020-12-29 DIAGNOSIS — J441 Chronic obstructive pulmonary disease with (acute) exacerbation: Secondary | ICD-10-CM | POA: Diagnosis not present

## 2020-12-29 DIAGNOSIS — E11649 Type 2 diabetes mellitus with hypoglycemia without coma: Secondary | ICD-10-CM | POA: Diagnosis not present

## 2020-12-29 DIAGNOSIS — I5032 Chronic diastolic (congestive) heart failure: Secondary | ICD-10-CM | POA: Diagnosis not present

## 2020-12-31 DIAGNOSIS — E782 Mixed hyperlipidemia: Secondary | ICD-10-CM | POA: Diagnosis not present

## 2020-12-31 DIAGNOSIS — I130 Hypertensive heart and chronic kidney disease with heart failure and stage 1 through stage 4 chronic kidney disease, or unspecified chronic kidney disease: Secondary | ICD-10-CM | POA: Diagnosis not present

## 2020-12-31 DIAGNOSIS — R001 Bradycardia, unspecified: Secondary | ICD-10-CM | POA: Diagnosis not present

## 2020-12-31 DIAGNOSIS — E11649 Type 2 diabetes mellitus with hypoglycemia without coma: Secondary | ICD-10-CM | POA: Diagnosis not present

## 2020-12-31 DIAGNOSIS — I2510 Atherosclerotic heart disease of native coronary artery without angina pectoris: Secondary | ICD-10-CM | POA: Diagnosis not present

## 2020-12-31 DIAGNOSIS — Z9181 History of falling: Secondary | ICD-10-CM | POA: Diagnosis not present

## 2020-12-31 DIAGNOSIS — J441 Chronic obstructive pulmonary disease with (acute) exacerbation: Secondary | ICD-10-CM | POA: Diagnosis not present

## 2020-12-31 DIAGNOSIS — I6521 Occlusion and stenosis of right carotid artery: Secondary | ICD-10-CM | POA: Diagnosis not present

## 2020-12-31 DIAGNOSIS — I5032 Chronic diastolic (congestive) heart failure: Secondary | ICD-10-CM | POA: Diagnosis not present

## 2020-12-31 DIAGNOSIS — N183 Chronic kidney disease, stage 3 unspecified: Secondary | ICD-10-CM | POA: Diagnosis not present

## 2020-12-31 DIAGNOSIS — Z794 Long term (current) use of insulin: Secondary | ICD-10-CM | POA: Diagnosis not present

## 2020-12-31 DIAGNOSIS — Z7982 Long term (current) use of aspirin: Secondary | ICD-10-CM | POA: Diagnosis not present

## 2020-12-31 DIAGNOSIS — E559 Vitamin D deficiency, unspecified: Secondary | ICD-10-CM | POA: Diagnosis not present

## 2020-12-31 DIAGNOSIS — E039 Hypothyroidism, unspecified: Secondary | ICD-10-CM | POA: Diagnosis not present

## 2020-12-31 DIAGNOSIS — R55 Syncope and collapse: Secondary | ICD-10-CM | POA: Diagnosis not present

## 2021-01-01 DIAGNOSIS — I130 Hypertensive heart and chronic kidney disease with heart failure and stage 1 through stage 4 chronic kidney disease, or unspecified chronic kidney disease: Secondary | ICD-10-CM | POA: Diagnosis not present

## 2021-01-01 DIAGNOSIS — I5032 Chronic diastolic (congestive) heart failure: Secondary | ICD-10-CM | POA: Diagnosis not present

## 2021-01-01 DIAGNOSIS — E11649 Type 2 diabetes mellitus with hypoglycemia without coma: Secondary | ICD-10-CM | POA: Diagnosis not present

## 2021-01-01 DIAGNOSIS — N183 Chronic kidney disease, stage 3 unspecified: Secondary | ICD-10-CM | POA: Diagnosis not present

## 2021-01-01 DIAGNOSIS — R55 Syncope and collapse: Secondary | ICD-10-CM | POA: Diagnosis not present

## 2021-01-01 DIAGNOSIS — J441 Chronic obstructive pulmonary disease with (acute) exacerbation: Secondary | ICD-10-CM | POA: Diagnosis not present

## 2021-01-04 DIAGNOSIS — R55 Syncope and collapse: Secondary | ICD-10-CM | POA: Diagnosis not present

## 2021-01-04 DIAGNOSIS — I5032 Chronic diastolic (congestive) heart failure: Secondary | ICD-10-CM | POA: Diagnosis not present

## 2021-01-04 DIAGNOSIS — I130 Hypertensive heart and chronic kidney disease with heart failure and stage 1 through stage 4 chronic kidney disease, or unspecified chronic kidney disease: Secondary | ICD-10-CM | POA: Diagnosis not present

## 2021-01-04 DIAGNOSIS — E11649 Type 2 diabetes mellitus with hypoglycemia without coma: Secondary | ICD-10-CM | POA: Diagnosis not present

## 2021-01-04 DIAGNOSIS — J441 Chronic obstructive pulmonary disease with (acute) exacerbation: Secondary | ICD-10-CM | POA: Diagnosis not present

## 2021-01-04 DIAGNOSIS — N183 Chronic kidney disease, stage 3 unspecified: Secondary | ICD-10-CM | POA: Diagnosis not present

## 2021-01-06 ENCOUNTER — Other Ambulatory Visit: Payer: Self-pay | Admitting: Interventional Cardiology

## 2021-01-06 DIAGNOSIS — R55 Syncope and collapse: Secondary | ICD-10-CM | POA: Diagnosis not present

## 2021-01-06 DIAGNOSIS — I5032 Chronic diastolic (congestive) heart failure: Secondary | ICD-10-CM | POA: Diagnosis not present

## 2021-01-06 DIAGNOSIS — I130 Hypertensive heart and chronic kidney disease with heart failure and stage 1 through stage 4 chronic kidney disease, or unspecified chronic kidney disease: Secondary | ICD-10-CM | POA: Diagnosis not present

## 2021-01-06 DIAGNOSIS — E11649 Type 2 diabetes mellitus with hypoglycemia without coma: Secondary | ICD-10-CM | POA: Diagnosis not present

## 2021-01-06 DIAGNOSIS — J441 Chronic obstructive pulmonary disease with (acute) exacerbation: Secondary | ICD-10-CM | POA: Diagnosis not present

## 2021-01-06 DIAGNOSIS — N183 Chronic kidney disease, stage 3 unspecified: Secondary | ICD-10-CM | POA: Diagnosis not present

## 2021-01-11 DIAGNOSIS — I130 Hypertensive heart and chronic kidney disease with heart failure and stage 1 through stage 4 chronic kidney disease, or unspecified chronic kidney disease: Secondary | ICD-10-CM | POA: Diagnosis not present

## 2021-01-11 DIAGNOSIS — E11649 Type 2 diabetes mellitus with hypoglycemia without coma: Secondary | ICD-10-CM | POA: Diagnosis not present

## 2021-01-11 DIAGNOSIS — J441 Chronic obstructive pulmonary disease with (acute) exacerbation: Secondary | ICD-10-CM | POA: Diagnosis not present

## 2021-01-11 DIAGNOSIS — N183 Chronic kidney disease, stage 3 unspecified: Secondary | ICD-10-CM | POA: Diagnosis not present

## 2021-01-11 DIAGNOSIS — R55 Syncope and collapse: Secondary | ICD-10-CM | POA: Diagnosis not present

## 2021-01-11 DIAGNOSIS — I5032 Chronic diastolic (congestive) heart failure: Secondary | ICD-10-CM | POA: Diagnosis not present

## 2021-01-14 DIAGNOSIS — R55 Syncope and collapse: Secondary | ICD-10-CM | POA: Diagnosis not present

## 2021-01-14 DIAGNOSIS — E11649 Type 2 diabetes mellitus with hypoglycemia without coma: Secondary | ICD-10-CM | POA: Diagnosis not present

## 2021-01-14 DIAGNOSIS — I5032 Chronic diastolic (congestive) heart failure: Secondary | ICD-10-CM | POA: Diagnosis not present

## 2021-01-14 DIAGNOSIS — I130 Hypertensive heart and chronic kidney disease with heart failure and stage 1 through stage 4 chronic kidney disease, or unspecified chronic kidney disease: Secondary | ICD-10-CM | POA: Diagnosis not present

## 2021-01-14 DIAGNOSIS — J441 Chronic obstructive pulmonary disease with (acute) exacerbation: Secondary | ICD-10-CM | POA: Diagnosis not present

## 2021-01-14 DIAGNOSIS — N183 Chronic kidney disease, stage 3 unspecified: Secondary | ICD-10-CM | POA: Diagnosis not present

## 2021-01-18 DIAGNOSIS — I130 Hypertensive heart and chronic kidney disease with heart failure and stage 1 through stage 4 chronic kidney disease, or unspecified chronic kidney disease: Secondary | ICD-10-CM | POA: Diagnosis not present

## 2021-01-18 DIAGNOSIS — J441 Chronic obstructive pulmonary disease with (acute) exacerbation: Secondary | ICD-10-CM | POA: Diagnosis not present

## 2021-01-18 DIAGNOSIS — N183 Chronic kidney disease, stage 3 unspecified: Secondary | ICD-10-CM | POA: Diagnosis not present

## 2021-01-18 DIAGNOSIS — I5032 Chronic diastolic (congestive) heart failure: Secondary | ICD-10-CM | POA: Diagnosis not present

## 2021-01-18 DIAGNOSIS — R55 Syncope and collapse: Secondary | ICD-10-CM | POA: Diagnosis not present

## 2021-01-18 DIAGNOSIS — E11649 Type 2 diabetes mellitus with hypoglycemia without coma: Secondary | ICD-10-CM | POA: Diagnosis not present

## 2021-01-19 DIAGNOSIS — N183 Chronic kidney disease, stage 3 unspecified: Secondary | ICD-10-CM | POA: Diagnosis not present

## 2021-01-19 DIAGNOSIS — E11649 Type 2 diabetes mellitus with hypoglycemia without coma: Secondary | ICD-10-CM | POA: Diagnosis not present

## 2021-01-19 DIAGNOSIS — I130 Hypertensive heart and chronic kidney disease with heart failure and stage 1 through stage 4 chronic kidney disease, or unspecified chronic kidney disease: Secondary | ICD-10-CM | POA: Diagnosis not present

## 2021-01-19 DIAGNOSIS — I5032 Chronic diastolic (congestive) heart failure: Secondary | ICD-10-CM | POA: Diagnosis not present

## 2021-01-19 DIAGNOSIS — J441 Chronic obstructive pulmonary disease with (acute) exacerbation: Secondary | ICD-10-CM | POA: Diagnosis not present

## 2021-01-19 DIAGNOSIS — R55 Syncope and collapse: Secondary | ICD-10-CM | POA: Diagnosis not present

## 2021-01-26 DIAGNOSIS — E11649 Type 2 diabetes mellitus with hypoglycemia without coma: Secondary | ICD-10-CM | POA: Diagnosis not present

## 2021-01-26 DIAGNOSIS — I130 Hypertensive heart and chronic kidney disease with heart failure and stage 1 through stage 4 chronic kidney disease, or unspecified chronic kidney disease: Secondary | ICD-10-CM | POA: Diagnosis not present

## 2021-01-26 DIAGNOSIS — I5032 Chronic diastolic (congestive) heart failure: Secondary | ICD-10-CM | POA: Diagnosis not present

## 2021-01-26 DIAGNOSIS — N183 Chronic kidney disease, stage 3 unspecified: Secondary | ICD-10-CM | POA: Diagnosis not present

## 2021-01-26 DIAGNOSIS — J441 Chronic obstructive pulmonary disease with (acute) exacerbation: Secondary | ICD-10-CM | POA: Diagnosis not present

## 2021-01-26 DIAGNOSIS — R55 Syncope and collapse: Secondary | ICD-10-CM | POA: Diagnosis not present

## 2021-01-29 DIAGNOSIS — E11649 Type 2 diabetes mellitus with hypoglycemia without coma: Secondary | ICD-10-CM | POA: Diagnosis not present

## 2021-01-29 DIAGNOSIS — N183 Chronic kidney disease, stage 3 unspecified: Secondary | ICD-10-CM | POA: Diagnosis not present

## 2021-01-29 DIAGNOSIS — I130 Hypertensive heart and chronic kidney disease with heart failure and stage 1 through stage 4 chronic kidney disease, or unspecified chronic kidney disease: Secondary | ICD-10-CM | POA: Diagnosis not present

## 2021-01-29 DIAGNOSIS — R55 Syncope and collapse: Secondary | ICD-10-CM | POA: Diagnosis not present

## 2021-01-29 DIAGNOSIS — J441 Chronic obstructive pulmonary disease with (acute) exacerbation: Secondary | ICD-10-CM | POA: Diagnosis not present

## 2021-01-29 DIAGNOSIS — I5032 Chronic diastolic (congestive) heart failure: Secondary | ICD-10-CM | POA: Diagnosis not present

## 2021-02-12 ENCOUNTER — Other Ambulatory Visit: Payer: Self-pay | Admitting: Interventional Cardiology

## 2021-02-20 DIAGNOSIS — E162 Hypoglycemia, unspecified: Secondary | ICD-10-CM | POA: Diagnosis not present

## 2021-02-20 DIAGNOSIS — E161 Other hypoglycemia: Secondary | ICD-10-CM | POA: Diagnosis not present

## 2021-02-20 DIAGNOSIS — I959 Hypotension, unspecified: Secondary | ICD-10-CM | POA: Diagnosis not present

## 2021-02-20 DIAGNOSIS — R0902 Hypoxemia: Secondary | ICD-10-CM | POA: Diagnosis not present

## 2021-02-26 ENCOUNTER — Other Ambulatory Visit: Payer: Self-pay | Admitting: Interventional Cardiology

## 2021-02-26 DIAGNOSIS — E1122 Type 2 diabetes mellitus with diabetic chronic kidney disease: Secondary | ICD-10-CM | POA: Diagnosis not present

## 2021-02-26 DIAGNOSIS — Z6832 Body mass index (BMI) 32.0-32.9, adult: Secondary | ICD-10-CM | POA: Diagnosis not present

## 2021-02-26 DIAGNOSIS — E669 Obesity, unspecified: Secondary | ICD-10-CM | POA: Diagnosis not present

## 2021-02-26 DIAGNOSIS — E1165 Type 2 diabetes mellitus with hyperglycemia: Secondary | ICD-10-CM | POA: Diagnosis not present

## 2021-02-26 DIAGNOSIS — E11641 Type 2 diabetes mellitus with hypoglycemia with coma: Secondary | ICD-10-CM | POA: Diagnosis not present

## 2021-02-26 DIAGNOSIS — N1831 Chronic kidney disease, stage 3a: Secondary | ICD-10-CM | POA: Diagnosis not present

## 2021-02-26 DIAGNOSIS — Z794 Long term (current) use of insulin: Secondary | ICD-10-CM | POA: Diagnosis not present

## 2021-02-26 DIAGNOSIS — E785 Hyperlipidemia, unspecified: Secondary | ICD-10-CM | POA: Diagnosis not present

## 2021-02-26 DIAGNOSIS — E559 Vitamin D deficiency, unspecified: Secondary | ICD-10-CM | POA: Diagnosis not present

## 2021-02-26 DIAGNOSIS — Z87891 Personal history of nicotine dependence: Secondary | ICD-10-CM | POA: Diagnosis not present

## 2021-02-26 DIAGNOSIS — I129 Hypertensive chronic kidney disease with stage 1 through stage 4 chronic kidney disease, or unspecified chronic kidney disease: Secondary | ICD-10-CM | POA: Diagnosis not present

## 2021-03-16 DIAGNOSIS — I2510 Atherosclerotic heart disease of native coronary artery without angina pectoris: Secondary | ICD-10-CM | POA: Diagnosis not present

## 2021-03-16 DIAGNOSIS — E039 Hypothyroidism, unspecified: Secondary | ICD-10-CM | POA: Diagnosis not present

## 2021-03-16 DIAGNOSIS — I10 Essential (primary) hypertension: Secondary | ICD-10-CM | POA: Diagnosis not present

## 2021-03-16 DIAGNOSIS — E7800 Pure hypercholesterolemia, unspecified: Secondary | ICD-10-CM | POA: Diagnosis not present

## 2021-03-16 DIAGNOSIS — E559 Vitamin D deficiency, unspecified: Secondary | ICD-10-CM | POA: Diagnosis not present

## 2021-03-16 DIAGNOSIS — M25569 Pain in unspecified knee: Secondary | ICD-10-CM | POA: Diagnosis not present

## 2021-03-16 DIAGNOSIS — E1121 Type 2 diabetes mellitus with diabetic nephropathy: Secondary | ICD-10-CM | POA: Diagnosis not present

## 2021-03-16 DIAGNOSIS — N183 Chronic kidney disease, stage 3 unspecified: Secondary | ICD-10-CM | POA: Diagnosis not present

## 2021-03-16 DIAGNOSIS — E538 Deficiency of other specified B group vitamins: Secondary | ICD-10-CM | POA: Diagnosis not present

## 2021-03-23 DIAGNOSIS — M1711 Unilateral primary osteoarthritis, right knee: Secondary | ICD-10-CM | POA: Diagnosis not present

## 2021-03-23 DIAGNOSIS — M1712 Unilateral primary osteoarthritis, left knee: Secondary | ICD-10-CM | POA: Diagnosis not present

## 2021-03-30 DIAGNOSIS — E1165 Type 2 diabetes mellitus with hyperglycemia: Secondary | ICD-10-CM | POA: Diagnosis not present

## 2021-03-30 DIAGNOSIS — Z794 Long term (current) use of insulin: Secondary | ICD-10-CM | POA: Diagnosis not present

## 2021-04-07 DIAGNOSIS — H9202 Otalgia, left ear: Secondary | ICD-10-CM | POA: Diagnosis not present

## 2021-04-10 ENCOUNTER — Encounter (HOSPITAL_COMMUNITY): Payer: Self-pay

## 2021-04-10 ENCOUNTER — Emergency Department (HOSPITAL_COMMUNITY): Payer: Medicare Other

## 2021-04-10 ENCOUNTER — Inpatient Hospital Stay (HOSPITAL_COMMUNITY)
Admission: EM | Admit: 2021-04-10 | Discharge: 2021-04-12 | DRG: 637 | Disposition: A | Discharge status: Home / Self Care | Payer: Medicare Other | Attending: Internal Medicine | Admitting: Internal Medicine

## 2021-04-10 ENCOUNTER — Other Ambulatory Visit: Payer: Self-pay

## 2021-04-10 ENCOUNTER — Observation Stay (HOSPITAL_COMMUNITY): Payer: Medicare Other

## 2021-04-10 DIAGNOSIS — G319 Degenerative disease of nervous system, unspecified: Secondary | ICD-10-CM | POA: Diagnosis not present

## 2021-04-10 DIAGNOSIS — N179 Acute kidney failure, unspecified: Secondary | ICD-10-CM | POA: Diagnosis not present

## 2021-04-10 DIAGNOSIS — I509 Heart failure, unspecified: Secondary | ICD-10-CM

## 2021-04-10 DIAGNOSIS — R404 Transient alteration of awareness: Secondary | ICD-10-CM | POA: Diagnosis not present

## 2021-04-10 DIAGNOSIS — R55 Syncope and collapse: Secondary | ICD-10-CM | POA: Diagnosis not present

## 2021-04-10 DIAGNOSIS — G3184 Mild cognitive impairment, so stated: Secondary | ICD-10-CM | POA: Diagnosis present

## 2021-04-10 DIAGNOSIS — E11649 Type 2 diabetes mellitus with hypoglycemia without coma: Secondary | ICD-10-CM | POA: Diagnosis not present

## 2021-04-10 DIAGNOSIS — Z8673 Personal history of transient ischemic attack (TIA), and cerebral infarction without residual deficits: Secondary | ICD-10-CM

## 2021-04-10 DIAGNOSIS — Z043 Encounter for examination and observation following other accident: Secondary | ICD-10-CM | POA: Diagnosis not present

## 2021-04-10 DIAGNOSIS — Z7982 Long term (current) use of aspirin: Secondary | ICD-10-CM

## 2021-04-10 DIAGNOSIS — Z20822 Contact with and (suspected) exposure to covid-19: Secondary | ICD-10-CM | POA: Diagnosis not present

## 2021-04-10 DIAGNOSIS — Z794 Long term (current) use of insulin: Secondary | ICD-10-CM

## 2021-04-10 DIAGNOSIS — Z8249 Family history of ischemic heart disease and other diseases of the circulatory system: Secondary | ICD-10-CM

## 2021-04-10 DIAGNOSIS — G9341 Metabolic encephalopathy: Secondary | ICD-10-CM | POA: Diagnosis not present

## 2021-04-10 DIAGNOSIS — R42 Dizziness and giddiness: Secondary | ICD-10-CM | POA: Diagnosis not present

## 2021-04-10 DIAGNOSIS — G9340 Encephalopathy, unspecified: Secondary | ICD-10-CM | POA: Diagnosis not present

## 2021-04-10 DIAGNOSIS — I130 Hypertensive heart and chronic kidney disease with heart failure and stage 1 through stage 4 chronic kidney disease, or unspecified chronic kidney disease: Secondary | ICD-10-CM | POA: Diagnosis present

## 2021-04-10 DIAGNOSIS — E782 Mixed hyperlipidemia: Secondary | ICD-10-CM | POA: Diagnosis present

## 2021-04-10 DIAGNOSIS — M47812 Spondylosis without myelopathy or radiculopathy, cervical region: Secondary | ICD-10-CM | POA: Diagnosis not present

## 2021-04-10 DIAGNOSIS — E162 Hypoglycemia, unspecified: Secondary | ICD-10-CM | POA: Diagnosis present

## 2021-04-10 DIAGNOSIS — M1990 Unspecified osteoarthritis, unspecified site: Secondary | ICD-10-CM | POA: Diagnosis present

## 2021-04-10 DIAGNOSIS — N1831 Chronic kidney disease, stage 3a: Secondary | ICD-10-CM | POA: Diagnosis present

## 2021-04-10 DIAGNOSIS — I5032 Chronic diastolic (congestive) heart failure: Secondary | ICD-10-CM | POA: Diagnosis not present

## 2021-04-10 DIAGNOSIS — E785 Hyperlipidemia, unspecified: Secondary | ICD-10-CM | POA: Diagnosis present

## 2021-04-10 DIAGNOSIS — I252 Old myocardial infarction: Secondary | ICD-10-CM

## 2021-04-10 DIAGNOSIS — E1122 Type 2 diabetes mellitus with diabetic chronic kidney disease: Secondary | ICD-10-CM | POA: Diagnosis present

## 2021-04-10 DIAGNOSIS — J449 Chronic obstructive pulmonary disease, unspecified: Secondary | ICD-10-CM | POA: Diagnosis present

## 2021-04-10 DIAGNOSIS — Z87891 Personal history of nicotine dependence: Secondary | ICD-10-CM

## 2021-04-10 DIAGNOSIS — E161 Other hypoglycemia: Secondary | ICD-10-CM | POA: Diagnosis not present

## 2021-04-10 DIAGNOSIS — E039 Hypothyroidism, unspecified: Secondary | ICD-10-CM | POA: Diagnosis present

## 2021-04-10 DIAGNOSIS — Z955 Presence of coronary angioplasty implant and graft: Secondary | ICD-10-CM

## 2021-04-10 DIAGNOSIS — Z79899 Other long term (current) drug therapy: Secondary | ICD-10-CM

## 2021-04-10 DIAGNOSIS — I2510 Atherosclerotic heart disease of native coronary artery without angina pectoris: Secondary | ICD-10-CM | POA: Diagnosis present

## 2021-04-10 DIAGNOSIS — R0902 Hypoxemia: Secondary | ICD-10-CM | POA: Diagnosis not present

## 2021-04-10 DIAGNOSIS — W19XXXA Unspecified fall, initial encounter: Secondary | ICD-10-CM | POA: Diagnosis present

## 2021-04-10 DIAGNOSIS — Z951 Presence of aortocoronary bypass graft: Secondary | ICD-10-CM

## 2021-04-10 DIAGNOSIS — I110 Hypertensive heart disease with heart failure: Secondary | ICD-10-CM | POA: Diagnosis not present

## 2021-04-10 DIAGNOSIS — R0602 Shortness of breath: Secondary | ICD-10-CM | POA: Diagnosis not present

## 2021-04-10 DIAGNOSIS — R296 Repeated falls: Secondary | ICD-10-CM

## 2021-04-10 LAB — CBC WITH DIFFERENTIAL/PLATELET
Abs Immature Granulocytes: 0.06 10*3/uL (ref 0.00–0.07)
Basophils Absolute: 0 10*3/uL (ref 0.0–0.1)
Basophils Relative: 0 %
Eosinophils Absolute: 0 10*3/uL (ref 0.0–0.5)
Eosinophils Relative: 0 %
HCT: 43.9 % (ref 39.0–52.0)
Hemoglobin: 14.4 g/dL (ref 13.0–17.0)
Immature Granulocytes: 1 %
Lymphocytes Relative: 7 %
Lymphs Abs: 0.6 10*3/uL — ABNORMAL LOW (ref 0.7–4.0)
MCH: 29.9 pg (ref 26.0–34.0)
MCHC: 32.8 g/dL (ref 30.0–36.0)
MCV: 91.3 fL (ref 80.0–100.0)
Monocytes Absolute: 0.7 10*3/uL (ref 0.1–1.0)
Monocytes Relative: 9 %
Neutro Abs: 6.5 10*3/uL (ref 1.7–7.7)
Neutrophils Relative %: 83 %
Platelets: 202 10*3/uL (ref 150–400)
RBC: 4.81 MIL/uL (ref 4.22–5.81)
RDW: 13.2 % (ref 11.5–15.5)
WBC: 7.9 10*3/uL (ref 4.0–10.5)
nRBC: 0 % (ref 0.0–0.2)

## 2021-04-10 LAB — URINALYSIS, ROUTINE W REFLEX MICROSCOPIC
Bilirubin Urine: NEGATIVE
Glucose, UA: NEGATIVE mg/dL
Ketones, ur: NEGATIVE mg/dL
Leukocytes,Ua: NEGATIVE
Nitrite: NEGATIVE
Protein, ur: 30 mg/dL — AB
Specific Gravity, Urine: 1.01 (ref 1.005–1.030)
pH: 6 (ref 5.0–8.0)

## 2021-04-10 LAB — CBG MONITORING, ED
Glucose-Capillary: 100 mg/dL — ABNORMAL HIGH (ref 70–99)
Glucose-Capillary: 99 mg/dL (ref 70–99)
Glucose-Capillary: 136 mg/dL — ABNORMAL HIGH (ref 70–99)

## 2021-04-10 LAB — COMPREHENSIVE METABOLIC PANEL
ALT: 23 U/L (ref 0–44)
AST: 47 U/L — ABNORMAL HIGH (ref 15–41)
Albumin: 3.4 g/dL — ABNORMAL LOW (ref 3.5–5.0)
Alkaline Phosphatase: 92 U/L (ref 38–126)
Anion gap: 11 (ref 5–15)
BUN: 41 mg/dL — ABNORMAL HIGH (ref 8–23)
CO2: 31 mmol/L (ref 22–32)
Calcium: 8.9 mg/dL (ref 8.9–10.3)
Chloride: 95 mmol/L — ABNORMAL LOW (ref 98–111)
Creatinine, Ser: 1.96 mg/dL — ABNORMAL HIGH (ref 0.61–1.24)
GFR, Estimated: 33 mL/min — ABNORMAL LOW (ref >60)
Glucose, Bld: 104 mg/dL — ABNORMAL HIGH (ref 70–99)
Potassium: 4.4 mmol/L (ref 3.5–5.1)
Sodium: 137 mmol/L (ref 135–145)
Total Bilirubin: 1.3 mg/dL — ABNORMAL HIGH (ref 0.3–1.2)
Total Protein: 6.6 g/dL (ref 6.5–8.1)

## 2021-04-10 LAB — GLUCOSE, CAPILLARY
Glucose-Capillary: 262 mg/dL — ABNORMAL HIGH (ref 70–99)
Glucose-Capillary: 186 mg/dL — ABNORMAL HIGH (ref 70–99)

## 2021-04-10 LAB — RESP PANEL BY RT-PCR (FLU A&B, COVID) ARPGX2
Influenza A by PCR: NEGATIVE
Influenza B by PCR: NEGATIVE
SARS Coronavirus 2 by RT PCR: NEGATIVE

## 2021-04-10 LAB — BRAIN NATRIURETIC PEPTIDE: B Natriuretic Peptide: 159.4 pg/mL — ABNORMAL HIGH (ref 0.0–100.0)

## 2021-04-10 MED ORDER — ASPIRIN EC 81 MG PO TBEC
81.0000 mg | DELAYED_RELEASE_TABLET | Freq: Every day | ORAL | Status: DC
  Administered 2021-04-10 – 2021-04-12 (×3): 81 mg via ORAL
  Filled 2021-04-10 (×3): qty 1

## 2021-04-10 MED ORDER — HYDROXYZINE HCL 25 MG PO TABS
25.0000 mg | ORAL_TABLET | Freq: Once | ORAL | Status: AC
  Administered 2021-04-10: 25 mg via ORAL
  Filled 2021-04-10: qty 1

## 2021-04-10 MED ORDER — ADULT MULTIVITAMIN W/MINERALS CH
1.0000 | ORAL_TABLET | Freq: Every day | ORAL | Status: DC
  Administered 2021-04-10 – 2021-04-12 (×3): 1 via ORAL
  Filled 2021-04-10 (×3): qty 1

## 2021-04-10 MED ORDER — ATORVASTATIN CALCIUM 20 MG PO TABS
20.0000 mg | ORAL_TABLET | Freq: Every day | ORAL | Status: DC
  Administered 2021-04-10 – 2021-04-12 (×3): 20 mg via ORAL
  Filled 2021-04-10 (×3): qty 1

## 2021-04-10 MED ORDER — FUROSEMIDE 40 MG PO TABS
80.0000 mg | ORAL_TABLET | Freq: Every day | ORAL | Status: DC
  Administered 2021-04-11 – 2021-04-12 (×2): 80 mg via ORAL
  Filled 2021-04-10 (×2): qty 2

## 2021-04-10 MED ORDER — FUROSEMIDE 40 MG PO TABS: 40.0000 mg | ORAL_TABLET | Freq: Two times a day (BID) | ORAL | Status: DC

## 2021-04-10 MED ORDER — SODIUM CHLORIDE 0.9% FLUSH
3.0000 mL | Freq: Two times a day (BID) | INTRAVENOUS | Status: DC
  Administered 2021-04-10 – 2021-04-12 (×4): 3 mL via INTRAVENOUS

## 2021-04-10 MED ORDER — UMECLIDINIUM BROMIDE 62.5 MCG/INH IN AEPB
1.0000 | INHALATION_SPRAY | Freq: Every day | RESPIRATORY_TRACT | Status: DC
  Administered 2021-04-11 – 2021-04-12 (×2): 1 via RESPIRATORY_TRACT
  Filled 2021-04-10: qty 7

## 2021-04-10 MED ORDER — FUROSEMIDE 40 MG PO TABS
40.0000 mg | ORAL_TABLET | Freq: Every day | ORAL | Status: DC
  Administered 2021-04-10 – 2021-04-11 (×2): 40 mg via ORAL
  Filled 2021-04-10 (×2): qty 1

## 2021-04-10 MED ORDER — FUROSEMIDE 10 MG/ML IJ SOLN
40.0000 mg | Freq: Once | INTRAMUSCULAR | Status: AC
  Administered 2021-04-10: 40 mg via INTRAVENOUS
  Filled 2021-04-10: qty 4

## 2021-04-10 MED ORDER — POLYVINYL ALCOHOL 1.4 % OP SOLN: 1.0000 [drp] | OPHTHALMIC | Status: DC | PRN

## 2021-04-10 MED ORDER — INSULIN ASPART 100 UNIT/ML IJ SOLN
0.0000 [IU] | Freq: Three times a day (TID) | INTRAMUSCULAR | Status: DC
  Administered 2021-04-11: 1 [IU] via SUBCUTANEOUS
  Administered 2021-04-11: 2 [IU] via SUBCUTANEOUS
  Administered 2021-04-11: 1 [IU] via SUBCUTANEOUS
  Administered 2021-04-12: 2 [IU] via SUBCUTANEOUS
  Administered 2021-04-12: 3 [IU] via SUBCUTANEOUS

## 2021-04-10 NOTE — ED Notes (Signed)
Hospitalist at the bedside 

## 2021-04-10 NOTE — ED Notes (Signed)
Pt A&O x4. Attached to cardiac monitor x3. VSS. NAD. Wife at the bedside.

## 2021-04-10 NOTE — ED Notes (Signed)
This nurse exited ED exam room 14 and was notified by Kandice Moos, RN and Abby, RN., that pt was sitting at the edge of his bed and had pulled off his cardiac leads. Pt was assisted back into ED stretcher. Family member now at the bedside.

## 2021-04-10 NOTE — ED Provider Notes (Signed)
[facility] DEPT Provider Note   CSN: [id number] Arrival date & time: 04/10/21  0156     History Chief Complaint  Patient presents with   Fall   Hypoglycemia    Luke [name] is a 87 y.o. male with a history of COPD, CAD, HLD, HTN, stroke, diabetes mellitus type 2 who presents to the emergency department by EMS with a chief complaint of fall.  The patient had an unwitnessed fall just prior to arrival.  Per nursing staff, EMS had indicated that when they arrived that the patient was laying on the ground and appeared agitated.  He was rolling around and flailing his arms and his legs.  CBG was 55.  He was given 20 g of D10 in route with EMS.  On arrival to the ER, patient is confused.  He is unable to provide any additional history about the fall.  He just states that he was on his way to go take his nighttime dose of insulin when he fell.  He is unable to tell me if he has had any dietary changes.  When asked about his home medications, he states sometimes I skipped some of my doses of my short acting medication.  Level 5 caveat secondary to confusion.  The history is provided by the patient and medical records. No language interpreter was used.      Past Medical History:  Diagnosis Date   Anginal pain ([place]) 02/08/12   Arthritis    "all over"   CKD (chronic kidney disease), stage III (HCC)    COPD (chronic obstructive pulmonary disease) (HCC)    Coronary artery disease    a. MI/CABG in 9/99 at [facility], [place] with (RIMA to LAD, LIMA to OM, SVG to PDA. b. LAD drug eluting stent in 3/09 through the graft to the distal LAD.   Exertional dyspnea    "doesn't take much these days"   H/O hiatal hernia    Hyperlipemia    Hypertension    Hypothyroidism    Hypoxia    Stroke ([place]) 09/2010   denies residual   Type II diabetes mellitus ([place])     Patient Active Problem List   Diagnosis Date Noted   Acute encephalopathy 04/10/2021    B12 deficiency 11/23/2020   Vitamin D deficiency 11/23/2020   Falls 11/23/2020   Syncope and collapse 11/23/2020   Hypoglycemia 11/23/2020   COPD with acute exacerbation ([place]) 11/23/2020   COPD (chronic obstructive pulmonary disease) ([place]) 09/18/2017   Hypoxemia 09/18/2017   Primary hypothyroidism 09/19/2016   Stage 3a chronic kidney disease ([place]) 09/19/2016   Type 2 diabetes mellitus with hyperglycemia, with long-term current use of insulin ([place]) 09/19/2016   CRI (chronic renal insufficiency) 06/09/2014   Overweight 06/09/2014   Coronary atherosclerosis of native coronary artery 12/06/2013   Mixed hyperlipidemia 12/06/2013   Nonspecific abnormal results of cardiovascular function study 12/06/2013   Chronic diastolic heart failure ([place]) 12/06/2013   Essential hypertension, benign 12/06/2013   AKI (acute kidney injury) ([place]) 03/17/2013   N&V (nausea and vomiting) 03/17/2013   Diarrhea 03/17/2013   DM (diabetes mellitus) ([place]) 03/17/2013   Hypotension 03/17/2013   Dizziness 03/17/2013    Past Surgical History:  Procedure Laterality Date   CATARACT EXTRACTION W/ INTRAOCULAR LENS  IMPLANT, BILATERAL  ~ 2007   COLONOSCOPY W/ POLYPECTOMY  09/2011   "removed 7"   CORONARY ANGIOPLASTY WITH STENT PLACEMENT  ~ 2004   "1"   CORONARY ARTERY BYPASS GRAFT  1999   CABG X4   EYE SURGERY     RETINAL DETACHMENT SURGERY  10/2011   right eye       Family History  Problem Relation Age of Onset   Heart attack Mother     Social History   Tobacco Use   Smoking status: Former    Packs/day: 2.00    Years: 35.00    Pack years: 70.00    Types: Cigarettes    Quit date: 07/25/1993    Years since quitting: 27.7   Smokeless tobacco: Former    Quit date: 07/18/1995  Substance Use Topics   Alcohol use: No    Comment: 02/08/12 "stopped ~ 1995"   Drug use: No    Home Medications Prior to Admission medications   Medication Sig Start Date End Date Taking? Authorizing Provider  acetaminophen  (TYLENOL) 500 MG tablet Take 1,000 mg by mouth every 6 (six) hours as needed for mild pain.    [provider]  aspirin EC 81 MG tablet Take 1 tablet (81 mg total) by mouth daily. 10/21/15   Jettie Booze, MD  atorvastatin (LIPITOR) 20 MG tablet TAKE 1 TABLET (20 MG TOTAL) BY MOUTH DAILY. PLEASE SCHEDULE APPT FOR FUTURE REFILLS. 2ND ATTEMPT 03/01/21   Jettie Booze, MD  furosemide (LASIX) 80 MG tablet Take 2 tablets by mouth in the a.m. and 1 tablet by mouth in the p.m.    [provider]  insulin aspart (NOVOLOG) 100 UNIT/ML injection Inject 10 Units into the skin 2 (two) times daily before a meal. 11/24/20   Samuella Cota, MD  insulin detemir (LEVEMIR) 100 UNIT/ML injection Inject 0.15 mLs (15 Units total) into the skin 2 (two) times daily. 11/24/20   Samuella Cota, MD  Multiple Vitamin (MULTIVITAMIN) capsule Take 1 capsule by mouth daily.    [provider]  nitroGLYCERIN (NITROSTAT) 0.4 MG SL tablet DISSOLVE 1 TABLET UNDER THE TONGUE EVERY 5 MINUTES AS NEEDED FOR CHEST PAIN Patient taking differently: Place 0.4 mg under the tongue every 5 (five) minutes as needed for chest pain. 12/24/18   Jettie Booze, MD  polyvinyl alcohol (LIQUIFILM TEARS) 1.4 % ophthalmic solution Place 1 drop into both eyes as needed (For dry eyes).    [provider]    Allergies    Patient has no known allergies.  Review of Systems   Review of Systems  Unable to perform ROS: Mental status change   Physical Exam Updated Vital Signs BP (!) 145/58 (BP Location: Right Arm)   Pulse 80   Temp 97.9 F (36.6 C) (Oral)   Resp 19   SpO2 93%   Physical Exam Vitals and nursing note reviewed.  Constitutional:      General: He is not in acute distress.    Appearance: He is well-developed. He is not ill-appearing, toxic-appearing or diaphoretic.  HENT:     Head: Normocephalic.  Eyes:     Extraocular Movements: Extraocular movements intact.      Conjunctiva/sclera: Conjunctivae normal.     Pupils: Pupils are equal, round, and reactive to light.  Cardiovascular:     Rate and Rhythm: Normal rate and regular rhythm.     Pulses: Normal pulses.     Heart sounds: Normal heart sounds. No murmur heard.   No friction rub. No gallop.  Pulmonary:     Effort: Pulmonary effort is normal. No respiratory distress.     Breath sounds: No stridor. No wheezing, rhonchi or rales.  Comments: Lungs are clear to auscultation bilaterally.  No increased work of breathing. Chest:     Chest wall: No tenderness.  Abdominal:     General: There is no distension.     Palpations: Abdomen is soft. There is no mass.     Tenderness: There is no abdominal tenderness. There is no right CVA tenderness, left CVA tenderness, guarding or rebound.     Hernia: No hernia is present.  Musculoskeletal:     Cervical back: Neck supple.     Right lower leg: No edema.     Left lower leg: No edema.     Comments: Erythema noted to the bilateral knees.  No focal tenderness, warmth, or swelling.  Diffusely tender to palpation to the right hip.  Full active and passive range of motion of the bilateral hips, knees, and ankles.  Neurovascularly intact to the bilateral lower extremities.  No rotation of the extremities.  No shortening.  Skin:    General: Skin is warm and dry.     Comments: Multiple skin tears noted to the upper extremities.  He has several superficial lacerations noted to the scalp.  Neurological:     Mental Status: He is alert.     Comments: He is oriented to person.  He knows the year is 2022.  Believes the month to be December.  He initially told nursing staff just prior to my evaluation that he was in [place].  He now knows that he is in [facility]  He is confused.  When asked if he is feeling short of breath, he repeatedly says that he was on his way to take his home doses of insulin before he fell.  He later was stating to staff that we took off all of  his clothes and he was naked in the emergency department.  Sensation is intact and equal.  Follows simple commands.  Good strength against resistance of the arms and legs bilaterally.  Sensation is intact and equal.  Cranial nerves II through XII are grossly intact.  Ambulates independently with unsteadiness.  Psychiatric:        Behavior: Behavior normal.    ED Results / Procedures / Treatments   Labs (all labs ordered are listed, but only abnormal results are displayed) Labs Reviewed  BRAIN NATRIURETIC PEPTIDE - Abnormal; Notable for the following components:      Result Value   B Natriuretic Peptide 159.4 (*)    All other components within normal limits  CBC WITH DIFFERENTIAL/PLATELET - Abnormal; Notable for the following components:   Lymphs Abs 0.6 (*)    All other components within normal limits  URINALYSIS, ROUTINE W REFLEX MICROSCOPIC - Abnormal; Notable for the following components:   Color, Urine YELLOW (*)    APPearance CLEAR (*)    Hgb urine dipstick SMALL (*)    Protein, ur 30 (*)    Bacteria, UA RARE (*)    All other components within normal limits  COMPREHENSIVE METABOLIC PANEL - Abnormal; Notable for the following components:   Chloride 95 (*)    Glucose, Bld 104 (*)    BUN 41 (*)    Creatinine, Ser 1.96 (*)    Albumin 3.4 (*)    AST 47 (*)    Total Bilirubin 1.3 (*)    GFR, Estimated 33 (*)    All other components within normal limits  CBG MONITORING, ED - Abnormal; Notable for the following components:   Glucose-Capillary 100 (*)    All  other components within normal limits  CBG MONITORING, ED - Abnormal; Notable for the following components:   Glucose-Capillary 136 (*)    All other components within normal limits  CBG MONITORING, ED    EKG EKG Interpretation  Date/Time:  Saturday April 10 2021 03:44:42 EDT Ventricular Rate:  82 PR Interval:  35 QRS Duration: 95 QT Interval:  398 QTC Calculation: 465 R Axis:   65 Text Interpretation: Sinus  rhythm Short PR interval Probable left atrial enlargement Probable anterior infarct, age indeterminate axis normal No acute ischemia Confirmed by Lorre Munroe ([id number]) on 04/10/2021 4:23:14 AM  Radiology DG Chest 2 View  Result Date: 04/10/2021 CLINICAL DATA:  Fall, shortness of breath EXAM: CHEST - 2 VIEW COMPARISON:  11/23/2020 FINDINGS: Sternal wires and multiple vascular clips overlying the heart. The heart size and mediastinal contours are within normal limits. No focal pulmonary opacity. No pleural effusion or pneumothorax. No acute osseous abnormality. IMPRESSION: No active cardiopulmonary disease. Electronically Signed   By: Merilyn Baba M.D.   On: 04/10/2021 03:49   CT HEAD WO CONTRAST (5MM)  Result Date: 04/10/2021 CLINICAL DATA:  Unwitnessed fall. EXAM: CT HEAD WITHOUT CONTRAST TECHNIQUE: Contiguous axial images were obtained from the base of the skull through the vertex without intravenous contrast. COMPARISON:  Nov 23, 2020 FINDINGS: Brain: There is mild cerebral atrophy with widening of the extra-axial spaces and ventricular dilatation. There are areas of decreased attenuation within the white matter tracts of the supratentorial brain, consistent with microvascular disease changes. Vascular: No hyperdense vessel or unexpected calcification. Skull: Normal. Negative for fracture or focal lesion. Sinuses/Orbits: No acute finding. Other: None. IMPRESSION: 1. No acute intracranial abnormality. 2. Generalized cerebral atrophy. Electronically Signed   By: Virgina Norfolk M.D.   On: 04/10/2021 03:24   CT CERVICAL SPINE WO CONTRAST  Result Date: 04/10/2021 CLINICAL DATA:  Unwitnessed fall. EXAM: CT CERVICAL SPINE WITHOUT CONTRAST TECHNIQUE: Multidetector CT imaging of the cervical spine was performed without intravenous contrast. Multiplanar CT image reconstructions were also generated. COMPARISON:  Nov 23, 2020 FINDINGS: Alignment: Normal. Skull base and vertebrae: No acute fracture. No primary bone  lesion or focal pathologic process. Soft tissues and spinal canal: No prevertebral fluid or swelling. No visible canal hematoma. Disc levels: Marked severity endplate sclerosis is seen at the levels of C5-C6 and C6-C7. There is mild to moderate severity intervertebral disc space narrowing at the level of C2-C3. Moderate to marked severity intervertebral disc space narrowing is seen at the levels of C5-C6 and C6-C7. Bilateral moderate to marked severity multilevel facet joint hypertrophy is noted with associated multilevel foraminal narrowing. Upper chest: Negative. Other: None. IMPRESSION: 1. No acute fracture or subluxation of the cervical spine. 2. Marked severity multilevel degenerative changes, most prominent at the levels of C5-C6 and C6-C7. Electronically Signed   By: Virgina Norfolk M.D.   On: 04/10/2021 03:27   DG Knee Complete 4 Views Left  Result Date: 04/10/2021 CLINICAL DATA:  Fall EXAM: LEFT KNEE - COMPLETE 4+ VIEW COMPARISON:  None. FINDINGS: No evidence of fracture, dislocation, or joint effusion. No evidence of arthropathy or other focal bone abnormality. Soft tissues are unremarkable. Atherosclerosis. IMPRESSION: Negative. Electronically Signed   By: Ulyses Jarred M.D.   On: 04/10/2021 03:51   DG Knee Complete 4 Views Right  Result Date: 04/10/2021 CLINICAL DATA:  Fall EXAM: RIGHT KNEE - COMPLETE 4+ VIEW COMPARISON:  None. FINDINGS: No evidence of fracture, dislocation, or joint effusion. No evidence of arthropathy or other focal  bone abnormality. Soft tissues are unremarkable. Atherosclerosis. IMPRESSION: Negative. Electronically Signed   By: Ulyses Jarred M.D.   On: 04/10/2021 03:50   DG Hip Unilat W or Wo Pelvis 2-3 Views Right  Result Date: 04/10/2021 CLINICAL DATA:  Fall EXAM: DG HIP (WITH OR WITHOUT PELVIS) 2-3V RIGHT COMPARISON:  None. FINDINGS: There is no evidence of hip fracture or dislocation. There is no evidence of arthropathy or other focal bone abnormality. IMPRESSION:  Negative. Electronically Signed   By: Ulyses Jarred M.D.   On: 04/10/2021 03:49    Procedures Procedures   Medications Ordered in ED Medications  furosemide (LASIX) injection 40 mg (40 mg Intravenous Given 04/10/21 0556)    ED Course  I have reviewed the triage vital signs and the nursing notes.  Pertinent labs & imaging results that were available during my care of the patient were reviewed by me and considered in my medical decision making (see chart for details).    MDM Rules/Calculators/A&P                           87 year old male with a history of COPD, CAD, HLD, HTN, stroke, diabetes mellitus type 2 who presents to the emergency department by EMS after an unwitnessed fall.  In route he was found to be hypoglycemic to 55 and was given 20 g of D10.  He was encephalopathic on arrival to the emergency department.  On arrival to the ER, hypoglycemia had resolved on multiple rechecks of his glucose.  However, patient remained encephalopathic.  The patient was seen and independently evaluated by Dr. Joya Gaskins, attending physician.  Patient is noted to have oxygen of around 90 to 92% on room air.  While initially evaluating the patient, oxygen saturation decreased to 88% for 1 to 2 minutes before returning to approximately 90-91.  Lungs are clear to auscultation bilaterally and he is in no increased work of breathing.  Vital signs are otherwise stable.  CT head and cervical spine are unremarkable.  X-rays of the knees, pelvis, and hip are negative.  Regards to hypoxia, chest x-ray was obtained and was unremarkable.  However, he does have a history of CHF and is on 80 mg of Lasix twice daily.  BNP was elevated to 159.  Last 2 previous labs were less than 50.  Suspect this onset may be falsely low given his body habitus.  He does have some abdominal distention, but new lower extremity edema.  After discussing with Dr. Joya Gaskins, will give 1 dose of 40 IV Lasix.  UA is not concerning for  infection.  Labs are also notable for new AKI with a creatinine of 1.96.  Based on his creatinine and BUN, less suspicious for prerenal azotremia.   We also consider PE as a source of hypoxia, but he is not a candidate for CT PE at this time.  Given his continued encephalopathy with intermittent hypoxia in the setting of an AKI, he will require admission.  Consult to the hospitalist team and Dr. Olevia Bowens will accept the patient for admission.  The patient appears reasonably stabilized for admission considering the current resources, flow, and capabilities available in the ED at this time, and I doubt any other [facility] requiring further screening and/or treatment in the ED prior to admission.   Final Clinical Impression(s) / ED Diagnoses Final diagnoses:  Unwitnessed fall  Encephalopathy  AKI (acute kidney injury) ([place])  Acute on chronic congestive heart failure, unspecified heart failure  type [facility])    Rx / DC Orders ED Discharge Orders     None        Joanne Gavel, PA-C 04/10/21 0915    Arnaldo Natal, MD 04/11/21 [phone]

## 2021-04-10 NOTE — ED Notes (Signed)
Patient ambulated to the restroom and back assisted by this nurse and EMT Joy, patient unsteady on feet at times and maintained an oxygen saturation over 91%

## 2021-04-10 NOTE — ED Notes (Signed)
Pt given breakfast tray and re-adjusted in ED stretcher. Pt able to use urinal with stand-by assist.

## 2021-04-10 NOTE — H&P (Signed)
History and Physical    Luke [name] NOM:[id number] DOB: 07/03/1933 DOA: 04/10/2021  PCP: Aretta Nip, MD  Patient coming from: Home  Chief Complaint: syncopal episode  HPI: Luke [name] is a 87 y.o. male with medical history significant of DM2, COPD, HLD, HTN, hypothyroidism. Presenting after fall. Says he was watching a ball game last night and he decided to do to be around 10p. He took his insulin shots for the night. The next thing he remembers is that was on the floor in the kitchen. His wife found him after coming to look for him. She found him on the floor and confused. She called EMS for help. She did not note any incontinence, tonic-clonic movement.   ED Course: He was found to be hypoglycemic and confused. CTH, c-spine were negative for acute injury. CXR was negative. BL knee films were negative. He was found to have an AKI. He was given 40mg  IV of lasix. TRH was called for admission.   Review of Systems:  Denies CP, palpitations, lightheadedness, dizziness, N/V/D. Review of systems is otherwise negative for all not mentioned in HPI.   PMHx Past Medical History:  Diagnosis Date   Anginal pain ([place]) 02/08/12   Arthritis    "all over"   CKD (chronic kidney disease), stage III (HCC)    COPD (chronic obstructive pulmonary disease) (HCC)    Coronary artery disease    a. MI/CABG in 9/99 at [facility], [place] with (RIMA to LAD, LIMA to OM, SVG to PDA. b. LAD drug eluting stent in 3/09 through the graft to the distal LAD.   Exertional dyspnea    "doesn't take much these days"   H/O hiatal hernia    Hyperlipemia    Hypertension    Hypothyroidism    Hypoxia    Stroke [facility]) 09/2010   denies residual   Type II diabetes mellitus ([place])     PSHx Past Surgical History:  Procedure Laterality Date   CATARACT EXTRACTION W/ INTRAOCULAR LENS  IMPLANT, BILATERAL  ~ 2007   COLONOSCOPY W/ POLYPECTOMY  09/2011   "removed 7"   CORONARY ANGIOPLASTY WITH STENT  PLACEMENT  ~ 2004   "1"   [place]   CABG X4   EYE SURGERY     RETINAL DETACHMENT SURGERY  10/2011   right eye    Soc[facility]x  reports that he quit smoking about 27 years ago. His smoking use included cigarettes. He has a 70.00 pack-year smoking history. He quit smokeless tobacco use about 25 years ago. He reports that he does not drink alcohol and does not use drugs.  No Known Allergies  FamHx Family History  Problem Relation Age of Onset   Heart attack Mother     Prior to Admission medications   Medication Sig Start Date End Date Taking? Authorizing Provider  acetaminophen (TYLENOL) 500 MG tablet Take 1,000 mg by mouth every 6 (six) hours as needed for mild pain.    [provider]  aspirin EC 81 MG tablet Take 1 tablet (81 mg total) by mouth daily. 10/21/15   Jettie Booze, MD  atorvastatin (LIPITOR) 20 MG tablet TAKE 1 TABLET (20 MG TOTAL) BY MOUTH DAILY. PLEASE SCHEDULE APPT FOR FUTURE REFILLS. 2ND ATTEMPT 03/01/21   Jettie Booze, MD  furosemide (LASIX) 80 MG tablet Take 2 tablets by mouth in the a.m. and 1 tablet by mouth in the p.m.    [provider]  insulin  aspart (NOVOLOG) 100 UNIT/ML injection Inject 10 Units into the skin 2 (two) times daily before a meal. 11/24/20   Samuella Cota, MD  insulin detemir (LEVEMIR) 100 UNIT/ML injection Inject 0.15 mLs (15 Units total) into the skin 2 (two) times daily. 11/24/20   Samuella Cota, MD  Multiple Vitamin (MULTIVITAMIN) capsule Take 1 capsule by mouth daily.    [provider]  nitroGLYCERIN (NITROSTAT) 0.4 MG SL tablet DISSOLVE 1 TABLET UNDER THE TONGUE EVERY 5 MINUTES AS NEEDED FOR CHEST PAIN Patient taking differently: Place 0.4 mg under the tongue every 5 (five) minutes as needed for chest pain. 12/24/18   Jettie Booze, MD  polyvinyl alcohol (LIQUIFILM TEARS) 1.4 % ophthalmic solution Place 1 drop into both eyes as needed (For dry eyes).    [provider]    Physical Exam: Vitals:   04/10/21 0400 04/10/21 0430 04/10/21 0500 04/10/21 0600  BP: (!) 143/58 (!) 137/59 (!) 154/66 (!) 143/85  Pulse: 81 81 89 84  Resp: (!) 26 (!) 26 (!) 29 (!) 22  Temp:      TempSrc:      SpO2: 90% 93% 95% 95%    General: 87 y.o. male resting in bed in NAD Eyes: PERRL, normal sclera ENMT: Nares patent w/o discharge, orophaynx clear, dentition normal, ears w/o discharge/lesions/ulcers Neck: Supple, trachea midline Cardiovascular: RRR, +S1, S2, no m/g/r, equal pulses throughout Respiratory: CTABL, no w/r/r, normal WOB GI: BS+, NDNT, no masses noted, no organomegaly noted MSK: No e/c/c Skin: No rashes. Scalp bruising/abrasions noted Neuro: A&O x 3, no focal deficits Psyc: Appropriate affect, but he still seems a little confused,  calm/cooperative  Labs on Admission: I have personally reviewed following labs and imaging studies  CBC: Recent Labs  Lab 04/10/21 0349  WBC 7.9  NEUTROABS 6.5  HGB 14.4  HCT 43.9  MCV 91.3  PLT 353   Basic Metabolic Panel: Recent Labs  Lab 04/10/21 0349  NA 137  K 4.4  CL 95*  CO2 31  GLUCOSE 104*  BUN 41*  CREATININE 1.96*  CALCIUM 8.9   GFR: CrCl cannot be calculated (Unknown ideal weight.). Liver Function Tests: Recent Labs  Lab 04/10/21 0349  AST 47*  ALT 23  ALKPHOS 92  BILITOT 1.3*  PROT 6.6  ALBUMIN 3.4*   No results for input(s): LIPASE, AMYLASE in the last 168 hours. No results for input(s): AMMONIA in the last 168 hours. Coagulation Profile: No results for input(s): INR, PROTIME in the last 168 hours. Cardiac Enzymes: No results for input(s): CKTOTAL, CKMB, CKMBINDEX, TROPONINI in the last 168 hours. BNP (last 3 results) No results for input(s): PROBNP in the last 8760 hours. HbA1C: No results for input(s): HGBA1C in the last 72 hours. CBG: Recent Labs  Lab 04/10/21 0207 04/10/21 0343  GLUCAP 100* 99   Lipid Profile: No results for input(s): CHOL, HDL, LDLCALC,  TRIG, CHOLHDL, LDLDIRECT in the last 72 hours. Thyroid Function Tests: No results for input(s): TSH, T4TOTAL, FREET4, T3FREE, THYROIDAB in the last 72 hours. Anemia Panel: No results for input(s): VITAMINB12, FOLATE, FERRITIN, TIBC, IRON, RETICCTPCT in the last 72 hours. Urine analysis:    Component Value Date/Time   COLORURINE YELLOW (A) 04/10/2021 0504   APPEARANCEUR CLEAR (A) 04/10/2021 0504   LABSPEC 1.010 04/10/2021 0504   PHURINE 6.0 04/10/2021 0504   GLUCOSEU NEGATIVE 04/10/2021 0504   HGBUR SMALL (A) 04/10/2021 0504   BILIRUBINUR NEGATIVE 04/10/2021 0504   KETONESUR NEGATIVE 04/10/2021 0504  PROTEINUR 30 (A) 04/10/2021 0504   UROBILINOGEN 0.2 03/19/2013 1636   NITRITE NEGATIVE 04/10/2021 0504   LEUKOCYTESUR NEGATIVE 04/10/2021 0504    Radiological Exams on Admission: DG Chest 2 View  Result Date: 04/10/2021 CLINICAL DATA:  Fall, shortness of breath EXAM: CHEST - 2 VIEW COMPARISON:  11/23/2020 FINDINGS: Sternal wires and multiple vascular clips overlying the heart. The heart size and mediastinal contours are within normal limits. No focal pulmonary opacity. No pleural effusion or pneumothorax. No acute osseous abnormality. IMPRESSION: No active cardiopulmonary disease. Electronically Signed   By: Merilyn Baba M.D.   On: 04/10/2021 03:49   CT HEAD WO CONTRAST (5MM)  Result Date: 04/10/2021 CLINICAL DATA:  Unwitnessed fall. EXAM: CT HEAD WITHOUT CONTRAST TECHNIQUE: Contiguous axial images were obtained from the base of the skull through the vertex without intravenous contrast. COMPARISON:  Nov 23, 2020 FINDINGS: Brain: There is mild cerebral atrophy with widening of the extra-axial spaces and ventricular dilatation. There are areas of decreased attenuation within the white matter tracts of the supratentorial brain, consistent with microvascular disease changes. Vascular: No hyperdense vessel or unexpected calcification. Skull: Normal. Negative for fracture or focal lesion.  Sinuses/Orbits: No acute finding. Other: None. IMPRESSION: 1. No acute intracranial abnormality. 2. Generalized cerebral atrophy. Electronically Signed   By: Virgina Norfolk M.D.   On: 04/10/2021 03:24   CT CERVICAL SPINE WO CONTRAST  Result Date: 04/10/2021 CLINICAL DATA:  Unwitnessed fall. EXAM: CT CERVICAL SPINE WITHOUT CONTRAST TECHNIQUE: Multidetector CT imaging of the cervical spine was performed without intravenous contrast. Multiplanar CT image reconstructions were also generated. COMPARISON:  Nov 23, 2020 FINDINGS: Alignment: Normal. Skull base and vertebrae: No acute fracture. No primary bone lesion or focal pathologic process. Soft tissues and spinal canal: No prevertebral fluid or swelling. No visible canal hematoma. Disc levels: Marked severity endplate sclerosis is seen at the levels of C5-C6 and C6-C7. There is mild to moderate severity intervertebral disc space narrowing at the level of C2-C3. Moderate to marked severity intervertebral disc space narrowing is seen at the levels of C5-C6 and C6-C7. Bilateral moderate to marked severity multilevel facet joint hypertrophy is noted with associated multilevel foraminal narrowing. Upper chest: Negative. Other: None. IMPRESSION: 1. No acute fracture or subluxation of the cervical spine. 2. Marked severity multilevel degenerative changes, most prominent at the levels of C5-C6 and C6-C7. Electronically Signed   By: Virgina Norfolk M.D.   On: 04/10/2021 03:27   DG Knee Complete 4 Views Left  Result Date: 04/10/2021 CLINICAL DATA:  Fall EXAM: LEFT KNEE - COMPLETE 4+ VIEW COMPARISON:  None. FINDINGS: No evidence of fracture, dislocation, or joint effusion. No evidence of arthropathy or other focal bone abnormality. Soft tissues are unremarkable. Atherosclerosis. IMPRESSION: Negative. Electronically Signed   By: Ulyses Jarred M.D.   On: 04/10/2021 03:51   DG Knee Complete 4 Views Right  Result Date: 04/10/2021 CLINICAL DATA:  Fall EXAM: RIGHT KNEE  - COMPLETE 4+ VIEW COMPARISON:  None. FINDINGS: No evidence of fracture, dislocation, or joint effusion. No evidence of arthropathy or other focal bone abnormality. Soft tissues are unremarkable. Atherosclerosis. IMPRESSION: Negative. Electronically Signed   By: Ulyses Jarred M.D.   On: 04/10/2021 03:50   DG Hip Unilat W or Wo Pelvis 2-3 Views Right  Result Date: 04/10/2021 CLINICAL DATA:  Fall EXAM: DG HIP (WITH OR WITHOUT PELVIS) 2-3V RIGHT COMPARISON:  None. FINDINGS: There is no evidence of hip fracture or dislocation. There is no evidence of arthropathy or other focal  bone abnormality. IMPRESSION: Negative. Electronically Signed   By: Ulyses Jarred M.D.   On: 04/10/2021 03:49    EKG: Independently reviewed. Sinus, no st elevation  Assessment/Plan Syncope Fall     - place in telemetry, obs     - it appears that he has had numerous visits to his PCP over the last several months w/ syncope and collapse; recent hospital stay here for same in May with the likely etiology being hypoglycemia     - again, etiology today is likely hypoglycemia     - palliative care consult; I don't know that there is a way to get home health to help with meds; but it may be to the point where he can no longer be at home unsupervised if this is continuing; consult OT and TOC.     - check orthostatics, echo     - EKG is ok, trend trp  DM2 Hypoglycemia     - DM diet, SSI, glucose checks  COPD     - continue home inhalers  HTN     - BP is good     - check orthostatics     - can continue his home regimen  HLD     - continue home statin  Chronic HFpEF CAD     - continue home regimen     - mild elevation of BNP, but does not appear volume overloaded  DVT prophylaxis: SCDs  Code Status: FULL  Family Communication: w/ wife at bedside  Consults called: None   Status is: Observation  The patient remains OBS appropriate and will d/c before 2 midnights.  Dispo: The patient is from: Home               Anticipated d/c is to: Home              Patient currently is not medically stable to d/c.   Difficult to place patient No  Time spent coordinating admission: 50 minutes  [facility]  If 7PM-7AM, please contact night-coverage [email or web address]  04/10/2021, 7:19 AM

## 2021-04-10 NOTE — ED Triage Notes (Signed)
Patient arrived via [facility] after an unwitnessed fall, generalized abrasions, no known LOC. Patient BS with EMS reading 55.  20G of D10 given en route. Oriented x2.

## 2021-04-11 ENCOUNTER — Observation Stay (HOSPITAL_COMMUNITY): Payer: Medicare Other

## 2021-04-11 DIAGNOSIS — I130 Hypertensive heart and chronic kidney disease with heart failure and stage 1 through stage 4 chronic kidney disease, or unspecified chronic kidney disease: Secondary | ICD-10-CM | POA: Diagnosis present

## 2021-04-11 DIAGNOSIS — Z951 Presence of aortocoronary bypass graft: Secondary | ICD-10-CM | POA: Diagnosis not present

## 2021-04-11 DIAGNOSIS — I5032 Chronic diastolic (congestive) heart failure: Secondary | ICD-10-CM | POA: Diagnosis present

## 2021-04-11 DIAGNOSIS — I2510 Atherosclerotic heart disease of native coronary artery without angina pectoris: Secondary | ICD-10-CM | POA: Diagnosis present

## 2021-04-11 DIAGNOSIS — R296 Repeated falls: Secondary | ICD-10-CM | POA: Diagnosis not present

## 2021-04-11 DIAGNOSIS — R0902 Hypoxemia: Secondary | ICD-10-CM | POA: Diagnosis not present

## 2021-04-11 DIAGNOSIS — R4182 Altered mental status, unspecified: Secondary | ICD-10-CM | POA: Diagnosis not present

## 2021-04-11 DIAGNOSIS — M1990 Unspecified osteoarthritis, unspecified site: Secondary | ICD-10-CM | POA: Diagnosis present

## 2021-04-11 DIAGNOSIS — N179 Acute kidney failure, unspecified: Secondary | ICD-10-CM | POA: Diagnosis present

## 2021-04-11 DIAGNOSIS — E039 Hypothyroidism, unspecified: Secondary | ICD-10-CM | POA: Diagnosis present

## 2021-04-11 DIAGNOSIS — R55 Syncope and collapse: Secondary | ICD-10-CM

## 2021-04-11 DIAGNOSIS — Z8673 Personal history of transient ischemic attack (TIA), and cerebral infarction without residual deficits: Secondary | ICD-10-CM | POA: Diagnosis not present

## 2021-04-11 DIAGNOSIS — G9340 Encephalopathy, unspecified: Secondary | ICD-10-CM

## 2021-04-11 DIAGNOSIS — I509 Heart failure, unspecified: Secondary | ICD-10-CM

## 2021-04-11 DIAGNOSIS — W19XXXA Unspecified fall, initial encounter: Secondary | ICD-10-CM | POA: Diagnosis present

## 2021-04-11 DIAGNOSIS — E162 Hypoglycemia, unspecified: Secondary | ICD-10-CM | POA: Diagnosis not present

## 2021-04-11 DIAGNOSIS — Z794 Long term (current) use of insulin: Secondary | ICD-10-CM | POA: Diagnosis not present

## 2021-04-11 DIAGNOSIS — G9341 Metabolic encephalopathy: Secondary | ICD-10-CM | POA: Diagnosis present

## 2021-04-11 DIAGNOSIS — E1122 Type 2 diabetes mellitus with diabetic chronic kidney disease: Secondary | ICD-10-CM | POA: Diagnosis present

## 2021-04-11 DIAGNOSIS — E11649 Type 2 diabetes mellitus with hypoglycemia without coma: Secondary | ICD-10-CM | POA: Diagnosis present

## 2021-04-11 DIAGNOSIS — J449 Chronic obstructive pulmonary disease, unspecified: Secondary | ICD-10-CM | POA: Diagnosis present

## 2021-04-11 DIAGNOSIS — N1831 Chronic kidney disease, stage 3a: Secondary | ICD-10-CM | POA: Diagnosis present

## 2021-04-11 DIAGNOSIS — E785 Hyperlipidemia, unspecified: Secondary | ICD-10-CM | POA: Diagnosis present

## 2021-04-11 DIAGNOSIS — G3184 Mild cognitive impairment, so stated: Secondary | ICD-10-CM | POA: Diagnosis present

## 2021-04-11 DIAGNOSIS — Z20822 Contact with and (suspected) exposure to covid-19: Secondary | ICD-10-CM | POA: Diagnosis present

## 2021-04-11 DIAGNOSIS — I252 Old myocardial infarction: Secondary | ICD-10-CM | POA: Diagnosis not present

## 2021-04-11 DIAGNOSIS — Z87891 Personal history of nicotine dependence: Secondary | ICD-10-CM | POA: Diagnosis not present

## 2021-04-11 DIAGNOSIS — Z8249 Family history of ischemic heart disease and other diseases of the circulatory system: Secondary | ICD-10-CM | POA: Diagnosis not present

## 2021-04-11 DIAGNOSIS — Z955 Presence of coronary angioplasty implant and graft: Secondary | ICD-10-CM | POA: Diagnosis not present

## 2021-04-11 DIAGNOSIS — Z7982 Long term (current) use of aspirin: Secondary | ICD-10-CM | POA: Diagnosis not present

## 2021-04-11 DIAGNOSIS — E782 Mixed hyperlipidemia: Secondary | ICD-10-CM | POA: Diagnosis present

## 2021-04-11 LAB — CBC
HCT: 44.2 % (ref 39.0–52.0)
Hemoglobin: 14.7 g/dL (ref 13.0–17.0)
MCH: 30.1 pg (ref 26.0–34.0)
MCHC: 33.3 g/dL (ref 30.0–36.0)
MCV: 90.4 fL (ref 80.0–100.0)
Platelets: 192 10*3/uL (ref 150–400)
RBC: 4.89 MIL/uL (ref 4.22–5.81)
RDW: 13.1 % (ref 11.5–15.5)
WBC: 5.2 10*3/uL (ref 4.0–10.5)
nRBC: 0 % (ref 0.0–0.2)

## 2021-04-11 LAB — TSH: TSH: 4.225 u[IU]/mL (ref 0.350–4.500)

## 2021-04-11 LAB — GLUCOSE, CAPILLARY
Glucose-Capillary: 158 mg/dL — ABNORMAL HIGH (ref 70–99)
Glucose-Capillary: 174 mg/dL — ABNORMAL HIGH (ref 70–99)
Glucose-Capillary: 182 mg/dL — ABNORMAL HIGH (ref 70–99)
Glucose-Capillary: 193 mg/dL — ABNORMAL HIGH (ref 70–99)
Glucose-Capillary: 218 mg/dL — ABNORMAL HIGH (ref 70–99)
Glucose-Capillary: 239 mg/dL — ABNORMAL HIGH (ref 70–99)
Glucose-Capillary: 302 mg/dL — ABNORMAL HIGH (ref 70–99)

## 2021-04-11 LAB — BASIC METABOLIC PANEL
Anion gap: 10 (ref 5–15)
BUN: 29 mg/dL — ABNORMAL HIGH (ref 8–23)
CO2: 32 mmol/L (ref 22–32)
Calcium: 9.3 mg/dL (ref 8.9–10.3)
Chloride: 101 mmol/L (ref 98–111)
Creatinine, Ser: 1.33 mg/dL — ABNORMAL HIGH (ref 0.61–1.24)
GFR, Estimated: 52 mL/min — ABNORMAL LOW (ref >60)
Glucose, Bld: 181 mg/dL — ABNORMAL HIGH (ref 70–99)
Potassium: 4.6 mmol/L (ref 3.5–5.1)
Sodium: 143 mmol/L (ref 135–145)

## 2021-04-11 LAB — ECHOCARDIOGRAM COMPLETE
AR max vel: 2.4 cm2
AV Area VTI: 2.24 cm2
AV Area mean vel: 2.24 cm2
AV Mean grad: 3 mmHg
AV Peak grad: 5 mmHg
Ao pk vel: 1.12 m/s
S' Lateral: 2.7 cm

## 2021-04-11 LAB — HEMOGLOBIN A1C
Hgb A1c MFr Bld: 7.3 % — ABNORMAL HIGH (ref 4.8–5.6)
Mean Plasma Glucose: 162.81 mg/dL

## 2021-04-11 MED ORDER — INSULIN DETEMIR 100 UNIT/ML ~~LOC~~ SOLN
8.0000 [IU] | Freq: Every day | SUBCUTANEOUS | Status: DC
  Administered 2021-04-11: 8 [IU] via SUBCUTANEOUS
  Filled 2021-04-11 (×2): qty 0.08

## 2021-04-11 MED ORDER — LORAZEPAM 2 MG/ML IJ SOLN: 0.2500 mg | INTRAMUSCULAR | Status: DC | PRN

## 2021-04-11 MED ORDER — HALOPERIDOL LACTATE 5 MG/ML IJ SOLN: 2.0000 mg | Freq: Four times a day (QID) | INTRAMUSCULAR | Status: DC | PRN

## 2021-04-11 MED ORDER — PERFLUTREN LIPID MICROSPHERE
1.0000 mL | INTRAVENOUS | Status: AC | PRN
  Administered 2021-04-11: 2 mL via INTRAVENOUS
  Filled 2021-04-11: qty 10

## 2021-04-11 NOTE — Evaluation (Signed)
Occupational Therapy Evaluation Patient Details Name: Luke [name] MRN: [id number] DOB: March 30, 1933 Today's Date: 04/11/2021   History of Present Illness Patient is a 87 year old male who presented after a fall at home. patient was found to have acute encephalopathy, AKI, syncope. PMH: DM type II, COPD, HTN, HLD   Clinical Impression   Patient is a 87 year old male who was admitted for above. Patient was living at home with wife prior level. Currently patient is on supplemental O2 at 3L/min at rest and with activity. Patient requires mod A for Lb dressing/bathing tasks. Patient was noted to have decreased memory,  decreased problem solving, decreased standing balance, decreased endurance and decreased functional activity tolerance impacting patients participation in ADLs. Patient would continue to benefit from skilled OT services at this time while admitted and after d/c to address noted deficits in order to improve overall safety and independence in ADLs.       Recommendations for follow up therapy are one component of a multi-disciplinary discharge planning process, led by the attending physician.  Recommendations may be updated based on patient status, additional functional criteria and insurance authorization.   Follow Up Recommendations  Supervision/Assistance - 24 hour;Home health OT    Equipment Recommendations  None recommended by OT    Recommendations for Other Services       Precautions / Restrictions Precautions Precautions: Fall Precaution Comments: monitor O2 Restrictions Weight Bearing Restrictions: No      Mobility Bed Mobility               General bed mobility comments: patient was seated in recliner and returned to this spot per patient request    Transfers Overall transfer level: Needs assistance Equipment used: Rolling walker (2 wheeled) Transfers: Sit to/from Stand Sit to Stand: Min assist              Balance Overall balance assessment:  Needs assistance Sitting-balance support: No upper extremity supported Sitting balance-Leahy Scale: Fair       Standing balance-Leahy Scale: Poor Standing balance comment: posterior leaning when attempting to don mask                           ADL either performed or assessed with clinical judgement   ADL Overall ADL's : Needs assistance/impaired Eating/Feeding: Set up;Sitting   Grooming: Wash/dry face;Wash/dry hands;Sitting;Set up   Upper Body Bathing: Sitting;Set up   Lower Body Bathing: Minimal assistance;Sit to/from stand   Upper Body Dressing : Set up;Sitting   Lower Body Dressing: Sit to/from stand;Minimal assistance   Toilet Transfer: Min guard;RW;Cueing for safety   Toileting- Clothing Manipulation and Hygiene: Minimal assistance;Sit to/from stand Toileting - Clothing Manipulation Details (indicate cue type and reason): patient was noted to have posterior leaning when standing with BUE off RW.patient reported multiple times that RW was not needed in session     Functional mobility during ADLs: Minimal assistance;Rolling walker       Vision Patient Visual Report: No change from baseline       Perception     Praxis      Pertinent Vitals/Pain Pain Assessment: No/denies pain     Hand Dominance Right   Extremity/Trunk Assessment Upper Extremity Assessment Upper Extremity Assessment: Overall WFL for tasks assessed   Lower Extremity Assessment Lower Extremity Assessment: Defer to PT evaluation       Communication Communication Communication: No difficulties   Cognition Arousal/Alertness: Awake/alert Behavior During Therapy: [facility] for  tasks assessed/performed Overall Cognitive Status: Difficult to assess                                 General Comments: patient was noted to score: 1 on minicog assessment with impaired memory and ability to participate in clock draw test noted.   General Comments       Exercises      Shoulder Instructions      Home Living Family/patient expects to be discharged to:: Private residence Living Arrangements: Spouse/significant other Available Help at Discharge: Family Type of Home: House Home Access: [place]: One [place]: Environmental consultant - 2 wheels          Prior Functioning/Environment Level of Independence: Independent        Comments: with son checking in on patient and wife each day. patient and wife complete all medication management, cooking and IADLs.        OT Problem List: Decreased strength;Decreased activity tolerance;Impaired balance (sitting and/or standing);Decreased safety awareness;Decreased knowledge of use of DME or AE      OT Treatment/Interventions: Self-care/ADL training;Therapeutic exercise;DME and/or AE instruction;Energy conservation;Therapeutic activities;Balance training;Patient/family education    OT Goals(Current goals can be found in the care plan section) Acute Rehab OT Goals Patient Stated Goal: none stated OT Goal Formulation: With patient Time For Goal Achievement: 04/25/21 Potential to Achieve Goals: Good  OT Frequency: Min 2X/week   Barriers to D/C:    home with wife who has her own health issues       Co-evaluation PT/OT/SLP Co-Evaluation/Treatment: Yes Reason for Co-Treatment: To address functional/ADL transfers PT goals addressed during session: Mobility/safety with mobility OT goals addressed during session: ADL's and self-care      AM-PAC OT "6 Clicks" Daily Activity     Outcome Measure Help from another person eating meals?: None Help from another person taking care of personal grooming?: A Little Help from another person toileting, which includes using toliet, bedpan, or urinal?: A Lot Help from another person bathing (including washing, rinsing, drying)?: A Lot Help from another person to put on and taking off regular upper body clothing?: A  Little Help from another person to put on and taking off regular lower body clothing?: A Lot 6 Click Score: 16   End of Session Equipment Utilized During Treatment: Gait belt;Rolling walker  Activity Tolerance: Patient tolerated treatment well Patient left: in chair;with call bell/phone within reach;with chair alarm set  OT Visit Diagnosis: Unsteadiness on feet (R26.81);Muscle weakness (generalized) (M62.81);Other abnormalities of gait and mobility (R26.89)                Time: 1638-4536 OT Time Calculation (min): 30 min Charges:  OT General Charges $OT Visit: 1 Visit OT Evaluation $OT Eval Moderate Complexity: 1 Mod  Jackelyn Poling OTR/L, MS Acute Rehabilitation Department Office# [phone] Pager# [phone]   [place] 04/11/2021, 4:33 PM

## 2021-04-11 NOTE — Evaluation (Signed)
Physical Therapy Evaluation Patient Details Name: Luke [name] MRN: [id number] DOB: 15-May-1933 Today's Date: 04/11/2021  History of Present Illness  Patient is a 87 year old male who presented after a fall at home. patient was found to have acute encephalopathy, AKI, syncope. PMH: DM type II, COPD, HTN, HLD  Clinical Impression   Patient is a 87 year old male who was admitted as above and with functional mobility limitations 2* decreased memory,  decreased problem solving, decreased standing balance, decreased endurance and decreased functional activity tolerance impacting patients participation.  Patient would continue to benefit from skilled PT services at this time while admitted and after d/c to address noted deficits in order to improve overall safety and independence .      Recommendations for follow up therapy are one component of a multi-disciplinary discharge planning process, led by the attending physician.  Recommendations may be updated based on patient status, additional functional criteria and insurance authorization.  Follow Up Recommendations Home health PT    Equipment Recommendations  None recommended by PT    Recommendations for Other Services       Precautions / Restrictions Precautions Precautions: Fall Precaution Comments: monitor O2 Restrictions Weight Bearing Restrictions: No      Mobility  Bed Mobility               General bed mobility comments: Pt up in chair and requests back to same    Transfers Overall transfer level: Needs assistance Equipment used: Rolling walker (2 wheeled) Transfers: Sit to/from Stand Sit to Stand: Min assist         General transfer comment: cues for safe transition position and use of UEs to self assist  Ambulation/Gait Ambulation/Gait assistance: Min assist;Min guard Gait Distance (Feet): 110 Feet Assistive device: Rolling walker (2 wheeled) Gait Pattern/deviations: Step-to pattern;Step-through  pattern;Shuffle;Trunk flexed     General Gait Details: cues for posture and position from RW; Steady assist esp on turns  Stairs            Wheelchair Mobility    Modified Rankin (Stroke Patients Only)       Balance Overall balance assessment: Needs assistance Sitting-balance support: No upper extremity supported Sitting balance-Leahy Scale: Fair     Standing balance support: Bilateral upper extremity supported Standing balance-Leahy Scale: Poor Standing balance comment: posterior leaning when attempting to don mask                             Pertinent Vitals/Pain Pain Assessment: No/denies pain    Home Living Family/patient expects to be discharged to:: Private residence Living Arrangements: Spouse/significant other Available Help at Discharge: Family Type of Home: House Home Access: Ramped entrance     Home Layout: One level Home Equipment: Walker - standard;Cane - single point      Prior Function Level of Independence: Independent         Comments: with son checking in on patient and wife each day. patient and wife complete all medication management, cooking and IADLs.     Hand Dominance   Dominant Hand: Right    Extremity/Trunk Assessment   Upper Extremity Assessment Upper Extremity Assessment: Overall WFL for tasks assessed    Lower Extremity Assessment Lower Extremity Assessment: Overall WFL for tasks assessed    Cervical / Trunk Assessment Cervical / Trunk Assessment: Kyphotic  Communication   Communication: No difficulties  Cognition Arousal/Alertness: Awake/alert Behavior During Therapy: WFL for tasks assessed/performed Overall  Cognitive Status: Difficult to assess                                 General Comments: patient was noted to score: 1 on minicog assessment with impaired memory and ability to participate in clock draw test noted.      General Comments      Exercises     Assessment/Plan     PT Assessment Patient needs continued PT services  PT Problem List Decreased activity tolerance;Decreased balance;Decreased mobility;Decreased knowledge of use of DME;Decreased safety awareness       PT Treatment Interventions DME instruction;Gait training;Functional mobility training;Therapeutic activities;Therapeutic exercise;Balance training;Cognitive remediation;Patient/family education    PT Goals (Current goals can be found in the Care Plan section)  Acute Rehab PT Goals Patient Stated Goal: HOME PT Goal Formulation: With patient Time For Goal Achievement: 04/25/21 Potential to Achieve Goals: Good    Frequency Min 3X/week   Barriers to discharge        Co-evaluation PT/OT/SLP Co-Evaluation/Treatment: Yes Reason for Co-Treatment: To address functional/ADL transfers PT goals addressed during session: Mobility/safety with mobility OT goals addressed during session: ADL's and self-care       AM-PAC PT "6 Clicks" Mobility  Outcome Measure Help needed turning from your back to your side while in a flat bed without using bedrails?: None Help needed moving from lying on your back to sitting on the side of a flat bed without using bedrails?: A Little Help needed moving to and from a bed to a chair (including a wheelchair)?: A Little Help needed standing up from a chair using your arms (e.g., wheelchair or bedside chair)?: A Little Help needed to walk in hospital room?: A Little Help needed climbing 3-5 steps with a railing? : A Lot 6 Click Score: 18    End of Session Equipment Utilized During Treatment: Gait belt Activity Tolerance: Patient tolerated treatment well;Patient limited by fatigue Patient left: in chair;with call bell/phone within reach;with family/visitor present Nurse Communication: Mobility status PT Visit Diagnosis: Unsteadiness on feet (R26.81);Difficulty in walking, not elsewhere classified (R26.2);History of falling (Z91.81)    Time: 6294-7654 PT Time  Calculation (min) (ACUTE ONLY): 21 min   Charges:   PT Evaluation $PT Eval Low Complexity: 1 Low          [facility] Pager [phone] Office [phone]   Zerenity Bowron 04/11/2021, 5:13 PM

## 2021-04-11 NOTE — Progress Notes (Signed)
PROGRESS NOTE  Luke [name] INO:[id number] DOB: [birth year] DOA: 04/10/2021 PCP: Aretta Nip, MD   LOS: 0 days   Brief Narrative / Interim history: 87 year old male with DM2, COPD, hypertension, hyperlipidemia, hypothyroidism comes to the hospital after having a fall.  Apparently he took his insulin for the night and next thing he remembers he was on the floor of the kitchen.  When EMS arrived he was found to be hypoglycemic and confused and was brought to the hospital.  Of note, patient has been having recurrent hypoglycemic episodes without awareness at home and his insulin dosing has been adjusted several times in the last few months.  He was also hospitalized few months ago with a similar presentation  Subjective / 24h Interval events: Confused this morning on my evaluation.  He does not know where he is, knows the year is 2022 but does not know the month.  Assessment & Plan: Principal Problem Type 2 diabetes mellitus with recurrent hypoglycemia resulting in a fall-patient was hypoglycemic when EMS arrived.  He appears not to have hypoglycemia awareness as he recalls times when his sugar was as low as 22 and was asymptomatic. -Long discussion with the son over the phone as well as bedside today, patient has been managing his diabetes quite well for several years but over the last 4 to 5 months he has been intermittently confused and did not seem to be as sharp when it comes to his CBGs and insulin administration.  He also sometimes gives the same insulin without eating.  He is clearly confused on my interview and cannot tell me the exact insulin regimen that he is at home -Given confusion and potential some cognitive issues at baseline worsening in the last few months he will probably need to have his insulin regimen simplified.  We will try Levemir once daily and keep on sliding scale to see if he needs any short-acting.  His A1c is 7.3, ideally for him should be closer to  8.  Active Problems Acute metabolic encephalopathy, suspect mild cognitive impairment which has progressed-patient definitely confused currently which is far from his baseline.  He did have a mild cognitive decline it appears based on my discussion with the son over the last several months.  He lives with his wife and son is close by but patient has been managing his diabetes without any help.  Chart review shows that he has not had hypoglycemic problems up until earlier this year, corresponding with periods when he has been having memory problems. -Given persistent encephalopathy and several episodes of hypoglycemia obtain a brain MRI  Acute kidney injury-Baseline creatinine 1.3-1.6, 1.9 on admission, this morning creatinine normalized  History of COPD-no wheezing, continue home inhalers  Essential hypertension-continue home medications  Hyperlipidemia-continue atorvastatin  Chronic diastolic CHF-continue home medications  CAD-stable  Scheduled Meds:  aspirin EC  81 mg Oral Daily   atorvastatin  20 mg Oral Daily   furosemide  80 mg Oral QAC breakfast   And   furosemide  40 mg Oral QPC supper   insulin aspart  0-6 Units Subcutaneous TID WC   insulin detemir  8 Units Subcutaneous Daily   multivitamin with minerals  1 tablet Oral Daily   sodium chloride flush  3 mL Intravenous Q12H   umeclidinium bromide  1 puff Inhalation Daily   Continuous Infusions: PRN Meds:.LORazepam, polyvinyl alcohol  Diet Orders (From admission, onward)     Start     Ordered   04/10/21 1650  Diet Carb Modified Fluid consistency: Thin; Room service appropriate? Yes  Diet effective now       Question Answer Comment  Diet-HS Snack? Nothing   Calorie Level Medium 1600-2000   Fluid consistency: Thin   Room service appropriate? Yes      04/10/21 1649            DVT prophylaxis: SCDs Start: 04/10/21 1650     Code Status: Full Code  Family Communication: Discussed with son and wife  Status is:  Observation  The patient will require care spanning > 2 midnights and should be moved to inpatient because: Altered mental status and Ongoing diagnostic testing needed not appropriate for outpatient work up  Dispo: The patient is from: Home              Anticipated d/c is to: Home              Patient currently is not medically stable to d/c.   Difficult to place patient No   Level of care: Telemetry  Consultants:  None   Procedures:  none  Microbiology  none  Antimicrobials: none    Objective: Vitals:   04/10/21 2015 04/11/21 0117 04/11/21 0416 04/11/21 0753  BP: (!) 163/69 (!) 174/68 (!) 180/77   Pulse: 81 80 93   Resp: 20 16 18    Temp: 98 F (36.7 C) 98.2 F (36.8 C) 98 F (36.7 C)   TempSrc: Oral  Oral   SpO2: 95% 95% 98% 99%    Intake/Output Summary (Last 24 hours) at 04/11/2021 1052 Last data filed at 04/10/2021 2230 Gross per 24 hour  Intake --  Output 375 ml  Net -375 ml   There were no vitals filed for this visit.  Examination:  Constitutional: NAD Eyes: no scleral icterus ENMT: Mucous membranes are moist.  Neck: normal, supple Respiratory: clear to auscultation bilaterally, no wheezing, no crackles. Normal respiratory effort.  Cardiovascular: Regular rate and rhythm, no murmurs / rubs / gallops. No LE edema. Good peripheral pulses Abdomen: non distended, no tenderness. Bowel sounds positive.  Musculoskeletal: no clubbing / cyanosis.  Skin: no rashes Neurologic: CN 2-12 grossly intact. Strength 5/5 in all 4.   Data Reviewed: I have independently reviewed following labs and imaging studies   CBC: Recent Labs  Lab 04/10/21 0349 04/11/21 0619  WBC 7.9 5.2  NEUTROABS 6.5  --   HGB 14.4 14.7  HCT 43.9 44.2  MCV 91.3 90.4  PLT 202 885   Basic Metabolic Panel: Recent Labs  Lab 04/10/21 0349 04/11/21 0619  NA 137 143  K 4.4 4.6  CL 95* 101  CO2 31 32  GLUCOSE 104* 181*  BUN 41* 29*  CREATININE 1.96* 1.33*  CALCIUM 8.9 9.3   Liver  Function Tests: Recent Labs  Lab 04/10/21 0349  AST 47*  ALT 23  ALKPHOS 92  BILITOT 1.3*  PROT 6.6  ALBUMIN 3.4*   Coagulation Profile: No results for input(s): INR, PROTIME in the last 168 hours. HbA1C: Recent Labs    04/11/21 0619  HGBA1C 7.3*   CBG: Recent Labs  Lab 04/10/21 1650 04/10/21 2011 04/11/21 0019 04/11/21 0418 04/11/21 0732  GLUCAP 262* 186* 182* 158* 174*    Recent Results (from the past 240 hour(s))  Resp Panel by RT-PCR (Flu A&B, Covid) Nasopharyngeal Swab     Status: None   Collection Time: 04/10/21  1:25 PM   Specimen: Nasopharyngeal Swab; Nasopharyngeal(NP) swabs in vial transport medium  Result Value Ref  Range Status   SARS Coronavirus 2 by RT PCR NEGATIVE NEGATIVE Final    Comment: (NOTE) SARS-CoV-2 target nucleic acids are NOT DETECTED.  The SARS-CoV-2 RNA is generally detectable in upper respiratory specimens during the acute phase of infection. The lowest concentration of SARS-CoV-2 viral copies this assay can detect is 138 copies/mL. A negative result does not preclude SARS-Cov-2 infection and should not be used as the sole basis for treatment or other patient management decisions. A negative result may occur with  improper specimen collection/handling, submission of specimen other than nasopharyngeal swab, presence of viral mutation(s) within the areas targeted by this assay, and inadequate number of viral copies(<138 copies/mL). A negative result must be combined with clinical observations, patient history, and epidemiological information. The expected result is Negative.  Fact Sheet for Patients:  EntrepreneurPulse.com.au  Fact Sheet for Healthcare Providers:  IncredibleEmployment.be  This test is no t yet approved or cleared by the [place] FDA and  has been authorized for detection and/or diagnosis of SARS-CoV-2 by FDA under an Emergency Use Authorization (EUA). This EUA will remain   in effect (meaning this test can be used) for the duration of the COVID-19 declaration under Section 564(b)(1) of the Act, 21 U.S.C.section 360bbb-3(b)(1), unless the authorization is terminated  or revoked sooner.       Influenza A by PCR NEGATIVE NEGATIVE Final   Influenza B by PCR NEGATIVE NEGATIVE Final    Comment: (NOTE) The Xpert Xpress SARS-CoV-2/FLU/RSV plus assay is intended as an aid in the diagnosis of influenza from Nasopharyngeal swab specimens and should not be used as a sole basis for treatment. Nasal washings and aspirates are unacceptable for Xpert Xpress SARS-CoV-2/FLU/RSV testing.  Fact Sheet for Patients: EntrepreneurPulse.com.au  Fact Sheet for Healthcare Providers: IncredibleEmployment.be  This test is not yet approved or cleared by the [place] FDA and has been authorized for detection and/or diagnosis of SARS-CoV-2 by FDA under an Emergency Use Authorization (EUA). This EUA will remain in effect (meaning this test can be used) for the duration of the COVID-19 declaration under Section 564(b)(1) of the Act, 21 U.S.C. section 360bbb-3(b)(1), unless the authorization is terminated or revoked.  Performed at [facility], [place] [address] [place], [place] [address]      Radiology Studies: No results found.   Time spent: 35 minutes, more than 50% at bedside in 2 separate visits and in discussion with the son/wife   Marzetta Board, MD, PhD [facility]  Between 7 am - 7 pm I am available, please contact me via Amion (for emergencies) or Securechat (non urgent messages)  Between 7 pm - 7 am I am not available, please contact night coverage MD/APP via Amion

## 2021-04-12 DIAGNOSIS — E162 Hypoglycemia, unspecified: Secondary | ICD-10-CM

## 2021-04-12 LAB — COMPREHENSIVE METABOLIC PANEL
ALT: 28 U/L (ref 0–44)
AST: 26 U/L (ref 15–41)
Albumin: 3.3 g/dL — ABNORMAL LOW (ref 3.5–5.0)
Alkaline Phosphatase: 83 U/L (ref 38–126)
Anion gap: 12 (ref 5–15)
BUN: 30 mg/dL — ABNORMAL HIGH (ref 8–23)
CO2: 31 mmol/L (ref 22–32)
Calcium: 8.7 mg/dL — ABNORMAL LOW (ref 8.9–10.3)
Chloride: 96 mmol/L — ABNORMAL LOW (ref 98–111)
Creatinine, Ser: 1.45 mg/dL — ABNORMAL HIGH (ref 0.61–1.24)
GFR, Estimated: 47 mL/min — ABNORMAL LOW (ref >60)
Glucose, Bld: 190 mg/dL — ABNORMAL HIGH (ref 70–99)
Potassium: 3.7 mmol/L (ref 3.5–5.1)
Sodium: 139 mmol/L (ref 135–145)
Total Bilirubin: 0.9 mg/dL (ref 0.3–1.2)
Total Protein: 6 g/dL — ABNORMAL LOW (ref 6.5–8.1)

## 2021-04-12 LAB — GLUCOSE, CAPILLARY
Glucose-Capillary: 189 mg/dL — ABNORMAL HIGH (ref 70–99)
Glucose-Capillary: 218 mg/dL — ABNORMAL HIGH (ref 70–99)
Glucose-Capillary: 260 mg/dL — ABNORMAL HIGH (ref 70–99)

## 2021-04-12 LAB — CBC
HCT: 42.6 % (ref 39.0–52.0)
Hemoglobin: 14 g/dL (ref 13.0–17.0)
MCH: 29.6 pg (ref 26.0–34.0)
MCHC: 32.9 g/dL (ref 30.0–36.0)
MCV: 90.1 fL (ref 80.0–100.0)
Platelets: 179 10*3/uL (ref 150–400)
RBC: 4.73 MIL/uL (ref 4.22–5.81)
RDW: 13 % (ref 11.5–15.5)
WBC: 4.7 10*3/uL (ref 4.0–10.5)
nRBC: 0 % (ref 0.0–0.2)

## 2021-04-12 MED ORDER — INSULIN DETEMIR 100 UNIT/ML ~~LOC~~ SOLN: 12.0000 [IU] | Freq: Every day | SUBCUTANEOUS | 0 refills | Status: DC

## 2021-04-12 MED ORDER — INSULIN ASPART 100 UNIT/ML ~~LOC~~ SOLN: 12.0000 [IU] | Freq: Every day | SUBCUTANEOUS | 0 refills | Status: DC

## 2021-04-12 MED ORDER — INSULIN DETEMIR 100 UNIT/ML ~~LOC~~ SOLN
12.0000 [IU] | Freq: Every day | SUBCUTANEOUS | Status: DC
  Administered 2021-04-12: 12 [IU] via SUBCUTANEOUS
  Filled 2021-04-12: qty 0.12

## 2021-04-12 MED ORDER — FREESTYLE LIBRE SENSOR SYSTEM MISC
1.0000 | Freq: Every day | 0 refills | Status: DC
Start: 2021-04-12 — End: 2021-10-20

## 2021-04-12 NOTE — Discharge Instructions (Addendum)
Please take Levemir 12 units once daily in the morning.  Please take NovoLog 12 units once daily before lunch  Please use libre system for CBG check, check her CBG at least 4 times daily before breakfast, lunch, dinner and bedtime, record CBGs and follow-up with your primary care doctor or endocrinologist within a week.  Have few hard candies with you all the time and if your CBG gets less than 70 take 1  Your CBGs may run a little bit higher in the 200 range in the evenings and that is okay  Follow with Rankins, Bill Salinas, MD in 5-7 days  Please get a complete blood count and chemistry panel checked by your Primary MD at your next visit, and again as instructed by your Primary MD. Please get your medications reviewed and adjusted by your Primary MD.  Please request your Primary MD to go over all Hospital Tests and Procedure/Radiological results at the follow up, please get all Hospital records sent to your Prim MD by signing hospital release before you go home.  In some cases, there will be blood work, cultures and biopsy results pending at the time of your discharge. Please request that your primary care M.D. goes through all the records of your hospital data and follows up on these results.  If you had Pneumonia of Lung problems at the Hospital: Please get a 2 view Chest X ray done in 6-8 weeks after hospital discharge or sooner if instructed by your Primary MD.  If you have Congestive Heart Failure: Please call your Cardiologist or Primary MD anytime you have any of the following symptoms:  1) 3 pound weight gain in 24 hours or 5 pounds in 1 week  2) shortness of breath, with or without a dry hacking cough  3) swelling in the hands, feet or stomach  4) if you have to sleep on extra pillows at night in order to breathe  Follow cardiac low salt diet and 1.5 lit/day fluid restriction.  If you have diabetes Accuchecks 4 times/day, Once in AM empty stomach and then before each  meal. Log in all results and show them to your primary doctor at your next visit. If any glucose reading is under 80 or above 300 call your primary MD immediately.  If you have Seizure/Convulsions/Epilepsy: Please do not drive, operate heavy machinery, participate in activities at heights or participate in high speed sports until you have seen by Primary MD or a Neurologist and advised to do so again. Per [facility] statutes, patients with seizures are not allowed to drive until they have been seizure-free for six months.  Use caution when using heavy equipment or power tools. Avoid working on ladders or at heights. Take showers instead of baths. Ensure the water temperature is not too high on the home water heater. Do not go swimming alone. Do not lock yourself in a room alone (i.e. bathroom). When caring for infants or small children, sit down when holding, feeding, or changing them to minimize risk of injury to the child in the event you have a seizure. Maintain good sleep hygiene. Avoid alcohol.   If you had Gastrointestinal Bleeding: Please ask your Primary MD to check a complete blood count within one week of discharge or at your next visit. Your endoscopic/colonoscopic biopsies that are pending at the time of discharge, will also need to followed by your Primary MD.  Get Medicines reviewed and adjusted. Please take all your medications with you for your  next visit with your Primary MD  Please request your Primary MD to go over all hospital tests and procedure/radiological results at the follow up, please ask your Primary MD to get all Hospital records sent to his/her office.  If you experience worsening of your admission symptoms, develop shortness of breath, life threatening emergency, suicidal or homicidal thoughts you must seek medical attention immediately by calling 911 or calling your MD immediately  if symptoms less severe.  You must read complete instructions/literature  along with all the possible adverse reactions/side effects for all the Medicines you take and that have been prescribed to you. Take any new Medicines after you have completely understood and accpet all the possible adverse reactions/side effects.   Do not drive or operate heavy machinery when taking Pain medications.   Do not take more than prescribed Pain, Sleep and Anxiety Medications  Special Instructions: If you have smoked or chewed Tobacco  in the last 2 yrs please stop smoking, stop any regular Alcohol  and or any Recreational drug use.  Wear Seat belts while driving.  Please note You were cared for by a hospitalist during your hospital stay. If you have any questions about your discharge medications or the care you received while you were in the hospital after you are discharged, you can call the unit and asked to speak with the hospitalist on call if the hospitalist that took care of you is not available. Once you are discharged, your primary care physician will handle any further medical issues. Please note that NO REFILLS for any discharge medications will be authorized once you are discharged, as it is imperative that you return to your primary care physician (or establish a relationship with a primary care physician if you do not have one) for your aftercare needs so that they can reassess your need for medications and monitor your lab values.  You can reach the hospitalist office at phone [phone] or fax [phone]   If you do not have a primary care physician, you can call [phone] for a physician referral.  Activity: As tolerated with Full fall precautions use walker/cane & assistance as needed    Diet: diabetic  Disposition Home

## 2021-04-12 NOTE — TOC Initial Note (Signed)
Transition of Care [facility]) - Initial/Assessment Note    Patient Details  Name: Luke [name] MRN: [id number] Date of Birth: 10/18/1932  [facility]) CM/SW Contact:    Lynnell Catalan, RN Phone Number: 04/12/2021, 11:09 AM  Clinical Narrative:                 Spoke with pt for dc planning. He politely declines any home health services at this time.   Expected Discharge Plan: Home/Self Care Barriers to Discharge: No Barriers Identified   Patient Goals and CMS Choice Patient states their goals for this hospitalization and ongoing recovery are:: To get home      Expected Discharge Plan and Services Expected Discharge Plan: Home/Self Care   Discharge Planning Services: CM Consult   Living arrangements for the past 2 months: Single Family Home Expected Discharge Date: 04/12/21                   Prior Living Arrangements/Services Living arrangements for the past 2 months: Single Family Home Lives with:: Spouse          Need for Family Participation in Patient Care: Yes (Comment) Care giver support system in place?: Yes (comment)   Criminal Activity/Legal Involvement Pertinent to Current Situation/Hospitalization: No - Comment as needed  Activities of Daily Living Home Assistive Devices/Equipment: Eyeglasses, CBG Meter, Walker (specify type), Other (Comment) (front wheeled walker, ramp entrance) ADL Screening (condition at time of admission) Patient's cognitive ability adequate to safely complete daily activities?: No Is the patient deaf or have difficulty hearing?: Yes ([place]) Does the patient have difficulty seeing, even when wearing glasses/contacts?: No Does the patient have difficulty concentrating, remembering, or making decisions?: Yes Patient able to express need for assistance with ADLs?: Yes Does the patient have difficulty dressing or bathing?: Yes Independently performs ADLs?: No Communication: Independent Dressing (OT): Needs assistance Is this a  change from baseline?: Pre-admission baseline Grooming: Needs assistance Is this a change from baseline?: Pre-admission baseline Feeding: Independent Bathing: Needs assistance Is this a change from baseline?: Pre-admission baseline Toileting: Needs assistance Is this a change from baseline?: Pre-admission baseline In/Out Bed: Needs assistance Is this a change from baseline?: Pre-admission baseline Walks in Home: Needs assistance Is this a change from baseline?: Pre-admission baseline Does the patient have difficulty walking or climbing stairs?: Yes (secondary to worsening weakness) Weakness of Legs: Both Weakness of Arms/Hands: None  Permission Sought/Granted                  Emotional Assessment   Attitude/Demeanor/Rapport: Gracious Affect (typically observed): Calm Orientation: : Oriented to Self, Oriented to Place, Oriented to Situation Alcohol / Substance Use: Not Applicable Psych Involvement: No (comment)  Admission diagnosis:  Encephalopathy [G93.40] Acute encephalopathy [G93.40] AKI (acute kidney injury) ([place]) [N17.9] Unwitnessed fall [R29.6] Acute on chronic congestive heart failure, unspecified heart failure type ([place]) [I50.9] Hypoglycemia [E16.2] Patient Active Problem List   Diagnosis Date Noted   Acute encephalopathy 04/10/2021   B12 deficiency 11/23/2020   Vitamin D deficiency 11/23/2020   Falls 11/23/2020   Syncope and collapse 11/23/2020   Hypoglycemia 11/23/2020   COPD with acute exacerbation ([place]) 11/23/2020   COPD (chronic obstructive pulmonary disease) ([place]) 09/18/2017   Hypoxemia 09/18/2017   Primary hypothyroidism 09/19/2016   Stage 3a chronic kidney disease ([place]) 09/19/2016   Type 2 diabetes mellitus with hyperglycemia, with long-term current use of insulin ([place]) 09/19/2016   CRI (chronic renal insufficiency) 06/09/2014   Overweight 06/09/2014   Coronary  atherosclerosis of native coronary artery 12/06/2013   Mixed hyperlipidemia 12/06/2013    Nonspecific abnormal results of cardiovascular function study 12/06/2013   Chronic diastolic heart failure ([place]) 12/06/2013   Essential hypertension, benign 12/06/2013   AKI (acute kidney injury) ([place]) 03/17/2013   N&V (nausea and vomiting) 03/17/2013   Diarrhea 03/17/2013   DM (diabetes mellitus) ([place]) 03/17/2013   Hypotension 03/17/2013   Dizziness 03/17/2013   PCP:  Aretta Nip, MD Pharmacy:   [id number] - [place], [place] - [address] [place] [address] Phone: [phone] Fax: [phone]     Social Determinants of Health (SDOH) Interventions    Readmission Risk Interventions Readmission Risk Prevention Plan 04/12/2021  Transportation Screening Complete  PCP or Specialist Appt within 5-7 Days Complete  Home Care Screening Complete  Medication Review (RN CM) Complete  Some recent data might be hidden

## 2021-04-12 NOTE — Plan of Care (Signed)

## 2021-04-12 NOTE — Discharge Summary (Signed)
Physician Discharge Summary  Luke [name] GMW:[id number] DOB: [birth year] DOA: 04/10/2021  PCP: Aretta Nip, MD  Admit date: 04/10/2021 Discharge date: 04/12/2021  Admitted From: home Disposition:  home  Recommendations for Outpatient Follow-up:  Follow up with PCP in 1-2 weeks Please obtain BMP/CBC in one week Please follow-up with your endocrinologist in 1 week  Home Health: PT, OT, RN Equipment/Devices: none  Discharge Condition: stable CODE STATUS: Full code Diet recommendation: diabetic  HPI: Per admitting MD, Luke [name] is a 87 y.o. male with medical history significant of DM2, COPD, HLD, HTN, hypothyroidism. Presenting after fall. Says he was watching a ball game last night and he decided to do to be around 10p. He took his insulin shots for the night. The next thing he remembers is that was on the floor in the kitchen. His wife found him after coming to look for him. She found him on the floor and confused. She called EMS for help. She did not note any incontinence, tonic-clonic movement.   Hospital Course / Discharge diagnoses: Principal Problem Type 2 diabetes mellitus with recurrent hypoglycemia resulting in a fall-patient was hypoglycemic when EMS arrived.  He appears not to have hypoglycemia awareness as he recalls times when his sugar was as low as 22 and was asymptomatic. Long discussion with the family, patient has been managing his diabetes quite well for several years but over the last 4 to 5 months he has been intermittently confused and did not seem to be as sharp when it comes to his CBGs and insulin administration.  He also sometimes gives himself insulin without eating. Given confusion and potential some cognitive issues at baseline worsening in the last few months he will probably need to have his insulin regimen simplified.  I will attempt Levemir once daily as well as NovoLog once daily in the morning, he generally eats better in the first part of  the day his main meals are breakfast and lunch and eats very little for dinner.  He will also be prescribed libre system to facilitate checking his sugars  Active Problems Acute metabolic encephalopathy, suspect mild cognitive impairment which has progressed-there was a component of in-hospital delirium but I do suspect early dementia, mild cognitive impairment.  MRI of the brain was negative for acute intracranial pathology but it did show a remote right parietal lobe infarct as well as parenchymal volume loss and chronic white matter microangiopathy.  If memory issues continue recommend neuropsychiatry testing as an outpatient  Acute kidney injury-Baseline creatinine 1.3-1.6, 1.9 on admission, now normalized History of COPD-no wheezing, continue home inhalers Essential hypertension-continue home medications Hyperlipidemia-continue atorvastatin Chronic diastolic CHF-continue home medications CAD-stable  Sepsis ruled out   Discharge Instructions   Allergies as of 04/12/2021   No Known Allergies      Medication List     STOP taking these medications    albuterol 108 (90 Base) MCG/ACT inhaler Commonly known as: VENTOLIN HFA   insulin lispro 100 UNIT/ML injection Commonly known as: HUMALOG   isosorbide mononitrate 60 MG 24 hr tablet Commonly known as: IMDUR   levothyroxine 75 MCG tablet Commonly known as: SYNTHROID   umeclidinium-vilanterol 62.5-25 MCG/INH Aepb Commonly known as: ANORO ELLIPTA       TAKE these medications    acetaminophen 500 MG tablet Commonly known as: TYLENOL Take 1,000 mg by mouth every 6 (six) hours as needed for mild pain.   aspirin EC 81 MG tablet Take 1 tablet (81 mg total) by  mouth daily.   atenolol 25 MG tablet Commonly known as: TENORMIN Take 25 mg by mouth daily.   atorvastatin 20 MG tablet Commonly known as: LIPITOR TAKE 1 TABLET (20 MG TOTAL) BY MOUTH DAILY. PLEASE SCHEDULE APPT FOR FUTURE REFILLS. 2ND ATTEMPT   Cholecalciferol  50 MCG (2000 UT) Tabs Take 1 tablet by mouth daily.   cyanocobalamin 1000 MCG tablet Take 1 tablet by mouth daily.   FreeStyle [employer] Misc 1 each by Does not apply route daily.   furosemide 80 MG tablet Commonly known as: LASIX Take 40-80 mg by mouth 2 (two) times daily. Take 2 tablets by mouth in the a.m. and 1 tablet by mouth in the p.m.   insulin aspart 100 UNIT/ML injection Commonly known as: novoLOG Inject 12 Units into the skin daily before lunch. What changed:  how much to take when to take this   insulin detemir 100 UNIT/ML injection Commonly known as: LEVEMIR Inject 0.12 mLs (12 Units total) into the skin daily. What changed:  how much to take when to take this   multivitamin capsule Take 1 capsule by mouth daily.   nitroGLYCERIN 0.4 MG SL tablet Commonly known as: NITROSTAT DISSOLVE 1 TABLET UNDER THE TONGUE EVERY 5 MINUTES AS NEEDED FOR CHEST PAIN   polyvinyl alcohol 1.4 % ophthalmic solution Commonly known as: LIQUIFILM TEARS Place 1 drop into both eyes as needed (For dry eyes).   umeclidinium bromide 62.5 MCG/INH Aepb Commonly known as: INCRUSE ELLIPTA Inhale 1 puff into the lungs daily as needed for wheezing.        Follow-up Information     Marcene Corning, MD Follow up in 1 week(s).   Specialty: Internal Medicine Contact information: [place] [id number] [phone]         Aretta Nip, MD Follow up in 1 week(s).   Specialty: Family Medicine Contact information: [place] [address] [phone]         Jettie Booze, MD .   Specialties: Cardiology, Radiology, Interventional Cardiology Contact information: [address] [address] [phone]                 Consultations: None   Procedures/Studies:  DG Chest 2 View  Result Date: 04/10/2021 CLINICAL DATA:  Fall, shortness of breath EXAM: CHEST - 2 VIEW  COMPARISON:  11/23/2020 FINDINGS: Sternal wires and multiple vascular clips overlying the heart. The heart size and mediastinal contours are within normal limits. No focal pulmonary opacity. No pleural effusion or pneumothorax. No acute osseous abnormality. IMPRESSION: No active cardiopulmonary disease. Electronically Signed   By: Merilyn Baba M.D.   On: 04/10/2021 03:49   CT HEAD WO CONTRAST (5MM)  Result Date: 04/10/2021 CLINICAL DATA:  Unwitnessed fall. EXAM: CT HEAD WITHOUT CONTRAST TECHNIQUE: Contiguous axial images were obtained from the base of the skull through the vertex without intravenous contrast. COMPARISON:  Nov 23, 2020 FINDINGS: Brain: There is mild cerebral atrophy with widening of the extra-axial spaces and ventricular dilatation. There are areas of decreased attenuation within the white matter tracts of the supratentorial brain, consistent with microvascular disease changes. Vascular: No hyperdense vessel or unexpected calcification. Skull: Normal. Negative for fracture or focal lesion. Sinuses/Orbits: No acute finding. Other: None. IMPRESSION: 1. No acute intracranial abnormality. 2. Generalized cerebral atrophy. Electronically Signed   By: Virgina Norfolk M.D.   On: 04/10/2021 03:24   CT CERVICAL SPINE WO CONTRAST  Result Date:  04/10/2021 CLINICAL DATA:  Unwitnessed fall. EXAM: CT CERVICAL SPINE WITHOUT CONTRAST TECHNIQUE: Multidetector CT imaging of the cervical spine was performed without intravenous contrast. Multiplanar CT image reconstructions were also generated. COMPARISON:  Nov 23, 2020 FINDINGS: Alignment: Normal. Skull base and vertebrae: No acute fracture. No primary bone lesion or focal pathologic process. Soft tissues and spinal canal: No prevertebral fluid or swelling. No visible canal hematoma. Disc levels: Marked severity endplate sclerosis is seen at the levels of C5-C6 and C6-C7. There is mild to moderate severity intervertebral disc space narrowing at the level of  C2-C3. Moderate to marked severity intervertebral disc space narrowing is seen at the levels of C5-C6 and C6-C7. Bilateral moderate to marked severity multilevel facet joint hypertrophy is noted with associated multilevel foraminal narrowing. Upper chest: Negative. Other: None. IMPRESSION: 1. No acute fracture or subluxation of the cervical spine. 2. Marked severity multilevel degenerative changes, most prominent at the levels of C5-C6 and C6-C7. Electronically Signed   By: Virgina Norfolk M.D.   On: 04/10/2021 03:27   MR BRAIN WO CONTRAST  Result Date: 04/11/2021 CLINICAL DATA:  Altered mental status EXAM: MRI HEAD WITHOUT CONTRAST TECHNIQUE: Multiplanar, multiecho pulse sequences of the brain and surrounding structures were obtained without intravenous contrast. COMPARISON:  CT head 1 day prior FINDINGS: Brain: There is no evidence of acute intracranial hemorrhage, extra-axial fluid collection, or acute infarct. There is encephalomalacia in the right parietal lobe consistent with prior infarct. There is a punctate chronic microhemorrhage in the right parietal lobe, nonspecific. There is moderate parenchymal volume loss with commensurate enlargement of the ventricular system. Foci of FLAIR signal abnormality in the subcortical and periventricular white matter likely reflects sequela of chronic white matter microangiopathy. There is no mass lesion.  There is no midline shift. Vascular: Normal flow voids. Skull and upper cervical spine: Normal marrow signal. Sinuses/Orbits: The imaged paranasal sinuses are clear. Bilateral lens implants are in place. The globes and orbits are otherwise unremarkable. Other: None. IMPRESSION: 1. No acute intracranial pathology. 2. Unchanged global parenchymal volume loss, chronic white matter microangiopathy, and remote right parietal lobe infarct. Electronically Signed   By: Valetta Mole M.D.   On: 04/11/2021 14:12   DG Knee Complete 4 Views Left  Result Date:  04/10/2021 CLINICAL DATA:  Fall EXAM: LEFT KNEE - COMPLETE 4+ VIEW COMPARISON:  None. FINDINGS: No evidence of fracture, dislocation, or joint effusion. No evidence of arthropathy or other focal bone abnormality. Soft tissues are unremarkable. Atherosclerosis. IMPRESSION: Negative. Electronically Signed   By: Ulyses Jarred M.D.   On: 04/10/2021 03:51   DG Knee Complete 4 Views Right  Result Date: 04/10/2021 CLINICAL DATA:  Fall EXAM: RIGHT KNEE - COMPLETE 4+ VIEW COMPARISON:  None. FINDINGS: No evidence of fracture, dislocation, or joint effusion. No evidence of arthropathy or other focal bone abnormality. Soft tissues are unremarkable. Atherosclerosis. IMPRESSION: Negative. Electronically Signed   By: Ulyses Jarred M.D.   On: 04/10/2021 03:50   ECHOCARDIOGRAM COMPLETE  Result Date: 04/11/2021    ECHOCARDIOGRAM REPORT   Patient Name:   RENAUD CELLI Date of Exam: 04/11/2021 Medical Rec #:  [id number]        Height:       71.0 in Accession #:    [id number]       Weight:       246.0 lb Date of Birth:  [birth year]        BSA:          2.303 m Patient  Age:    87 years         BP:           158/57 mmHg Patient Gender: M                HR:           84 bpm. Exam Location:  Inpatient Procedure: 2D Echo, Color Doppler, Cardiac Doppler and Intracardiac            Opacification Agent Indications:    Syncope R55  History:        Patient has prior history of Echocardiogram examinations, most                 recent 11/23/2020. CAD, Prior CABG, Stroke and COPD,                 Signs/Symptoms:Syncope; Risk Factors:Hypertension, Diabetes and                 Dyslipidemia.  Sonographer:    Merrie Roof RDCS Referring Phys: [id number] [place]  1. Left ventricular ejection fraction, by estimation, is 65 to 70%. The left ventricle has normal function. The left ventricle has no regional wall motion abnormalities. Indeterminate diastolic filling due to E-A fusion.  2. Right ventricular systolic function is normal. The  right ventricular size is normal. Tricuspid regurgitation signal is inadequate for assessing PA pressure.  3. The mitral valve is grossly normal. No evidence of mitral valve regurgitation. No evidence of mitral stenosis.  4. The aortic valve is tricuspid. There is mild calcification of the aortic valve. Aortic valve regurgitation is not visualized. Mild aortic valve sclerosis is present, with no evidence of aortic valve stenosis.  5. The inferior vena cava is normal in size with greater than 50% respiratory variability, suggesting right atrial pressure of 3 mmHg. Comparison(s): No significant change from prior study. FINDINGS  Left Ventricle: Left ventricular ejection fraction, by estimation, is 65 to 70%. The left ventricle has normal function. The left ventricle has no regional wall motion abnormalities. The left ventricular internal cavity size was normal in size. There is  no left ventricular hypertrophy. Indeterminate diastolic filling due to E-A fusion. Right Ventricle: The right ventricular size is normal. No increase in right ventricular wall thickness. Right ventricular systolic function is normal. Tricuspid regurgitation signal is inadequate for assessing PA pressure. Left Atrium: Left atrial size was normal in size. Right Atrium: Right atrial size was normal in size. Pericardium: There is no evidence of pericardial effusion. Mitral Valve: The mitral valve is grossly normal. No evidence of mitral valve regurgitation. No evidence of mitral valve stenosis. Tricuspid Valve: The tricuspid valve is grossly normal. Tricuspid valve regurgitation is trivial. No evidence of tricuspid stenosis. Aortic Valve: The aortic valve is tricuspid. There is mild calcification of the aortic valve. Aortic valve regurgitation is not visualized. Mild aortic valve sclerosis is present, with no evidence of aortic valve stenosis. Aortic valve mean gradient measures 3.0 mmHg. Aortic valve peak gradient measures 5.0 mmHg. Aortic valve  area, by VTI measures 2.24 cm. Pulmonic Valve: The pulmonic valve was grossly normal. Pulmonic valve regurgitation is not visualized. No evidence of pulmonic stenosis. Aorta: The aortic root and ascending aorta are structurally normal, with no evidence of dilitation. Venous: The inferior vena cava is normal in size with greater than 50% respiratory variability, suggesting right atrial pressure of 3 mmHg. IAS/Shunts: The atrial septum is grossly normal.  LEFT VENTRICLE PLAX 2D LVIDd:  4.20 cm  Diastology LVIDs:         2.70 cm  LV e' medial:  7.29 cm/s LV PW:         1.20 cm  LV e' lateral: 9.79 cm/s LV IVS:        1.10 cm LVOT diam:     1.90 cm LV SV:         43 LV SV Index:   19 LVOT Area:     2.84 cm  RIGHT VENTRICLE RV Basal diam:  3.60 cm LEFT ATRIUM           Index       RIGHT ATRIUM           Index LA diam:      4.00 cm 1.74 cm/m  RA Area:     13.40 cm LA Vol (A4C): 65.9 ml 28.62 ml/m RA Volume:   35.20 ml  15.29 ml/m  AORTIC VALVE AV Area (Vmax):    2.40 cm AV Area (Vmean):   2.24 cm AV Area (VTI):     2.24 cm AV Vmax:           112.00 cm/s AV Vmean:          75.300 cm/s AV VTI:            0.194 m AV Peak Grad:      5.0 mmHg AV Mean Grad:      3.0 mmHg LVOT Vmax:         94.90 cm/s LVOT Vmean:        59.400 cm/s LVOT VTI:          0.153 m LVOT/AV VTI ratio: 0.79  AORTA Ao Root diam: 3.80 cm Ao Asc diam:  3.50 cm  SHUNTS Systemic VTI:  0.15 m Systemic Diam: 1.90 cm Eleonore Chiquito MD Electronically signed by Eleonore Chiquito MD Signature Date/Time: 04/11/2021/3:08:19 PM    Final    DG Hip Unilat W or Wo Pelvis 2-3 Views Right  Result Date: 04/10/2021 CLINICAL DATA:  Fall EXAM: DG HIP (WITH OR WITHOUT PELVIS) 2-3V RIGHT COMPARISON:  None. FINDINGS: There is no evidence of hip fracture or dislocation. There is no evidence of arthropathy or other focal bone abnormality. IMPRESSION: Negative. Electronically Signed   By: Ulyses Jarred M.D.   On: 04/10/2021 03:49     Subjective: - no chest pain,  shortness of breath, no abdominal pain, nausea or vomiting.   Discharge Exam: BP (!) 153/61 (BP Location: Right Arm)   Pulse 73   Temp 98.6 F (37 C) (Oral)   Resp 18   Wt 95.3 kg   SpO2 (!) 86%   BMI 29.30 kg/m   General: Pt is alert, awake, not in acute distress Cardiovascular: RRR, S1/S2 +, no rubs, no gallops Respiratory: CTA bilaterally, no wheezing, no rhonchi Abdominal: Soft, NT, ND, bowel sounds + Extremities: no edema, no cyanosis    The results of significant diagnostics from this hospitalization (including imaging, microbiology, ancillary and laboratory) are listed below for reference.     Microbiology: Recent Results (from the past 240 hour(s))  Resp Panel by RT-PCR (Flu A&B, Covid) Nasopharyngeal Swab     Status: None   Collection Time: 04/10/21  1:25 PM   Specimen: Nasopharyngeal Swab; Nasopharyngeal(NP) swabs in vial transport medium  Result Value Ref Range Status   SARS Coronavirus 2 by RT PCR NEGATIVE NEGATIVE Final    Comment: (NOTE) SARS-CoV-2 target nucleic acids are NOT DETECTED.  The  SARS-CoV-2 RNA is generally detectable in upper respiratory specimens during the acute phase of infection. The lowest concentration of SARS-CoV-2 viral copies this assay can detect is 138 copies/mL. A negative result does not preclude SARS-Cov-2 infection and should not be used as the sole basis for treatment or other patient management decisions. A negative result may occur with  improper specimen collection/handling, submission of specimen other than nasopharyngeal swab, presence of viral mutation(s) within the areas targeted by this assay, and inadequate number of viral copies(<138 copies/mL). A negative result must be combined with clinical observations, patient history, and epidemiological information. The expected result is Negative.  Fact Sheet for Patients:  EntrepreneurPulse.com.au  Fact Sheet for Healthcare Providers:   IncredibleEmployment.be  This test is no t yet approved or cleared by the [place] FDA and  has been authorized for detection and/or diagnosis of SARS-CoV-2 by FDA under an Emergency Use Authorization (EUA). This EUA will remain  in effect (meaning this test can be used) for the duration of the COVID-19 declaration under Section 564(b)(1) of the Act, 21 U.S.C.section 360bbb-3(b)(1), unless the authorization is terminated  or revoked sooner.       Influenza A by PCR NEGATIVE NEGATIVE Final   Influenza B by PCR NEGATIVE NEGATIVE Final    Comment: (NOTE) The Xpert Xpress SARS-CoV-2/FLU/RSV plus assay is intended as an aid in the diagnosis of influenza from Nasopharyngeal swab specimens and should not be used as a sole basis for treatment. Nasal washings and aspirates are unacceptable for Xpert Xpress SARS-CoV-2/FLU/RSV testing.  Fact Sheet for Patients: EntrepreneurPulse.com.au  Fact Sheet for Healthcare Providers: IncredibleEmployment.be  This test is not yet approved or cleared by the [place] FDA and has been authorized for detection and/or diagnosis of SARS-CoV-2 by FDA under an Emergency Use Authorization (EUA). This EUA will remain in effect (meaning this test can be used) for the duration of the COVID-19 declaration under Section 564(b)(1) of the Act, 21 U.S.C. section 360bbb-3(b)(1), unless the authorization is terminated or revoked.  Performed at [facility], [place] [address], [place], [place] [address]      Labs: Basic Metabolic Panel: Recent Labs  Lab 04/10/21 0349 04/11/21 0619 04/12/21 0456  NA 137 143 139  K 4.4 4.6 3.7  CL 95* 101 96*  CO2 31 32 31  GLUCOSE 104* 181* 190*  BUN 41* 29* 30*  CREATININE 1.96* 1.33* 1.45*  CALCIUM 8.9 9.3 8.7*   Liver Function Tests: Recent Labs  Lab 04/10/21 0349 04/12/21 0456  AST 47* 26  ALT 23 28  ALKPHOS 92 83  BILITOT 1.3*  0.9  PROT 6.6 6.0*  ALBUMIN 3.4* 3.3*   CBC: Recent Labs  Lab 04/10/21 0349 04/11/21 0619 04/12/21 0456  WBC 7.9 5.2 4.7  NEUTROABS 6.5  --   --   HGB 14.4 14.7 14.0  HCT 43.9 44.2 42.6  MCV 91.3 90.4 90.1  PLT 202 192 179   CBG: Recent Labs  Lab 04/11/21 1629 04/11/21 2016 04/11/21 2355 04/12/21 0334 04/12/21 0740  GLUCAP 218* 302* 239* 189* 218*   Hgb A1c Recent Labs    04/11/21 0619  HGBA1C 7.3*   Lipid Profile No results for input(s): CHOL, HDL, LDLCALC, TRIG, CHOLHDL, LDLDIRECT in the last 72 hours. Thyroid function studies Recent Labs    04/11/21 0848  TSH 4.225   Urinalysis    Component Value Date/Time   COLORURINE YELLOW (A) 04/10/2021 0504   APPEARANCEUR CLEAR (A) 04/10/2021 0504   LABSPEC 1.010  04/10/2021 0504   PHURINE 6.0 04/10/2021 0504   GLUCOSEU NEGATIVE 04/10/2021 0504   HGBUR SMALL (A) 04/10/2021 0504   BILIRUBINUR NEGATIVE 04/10/2021 0504   KETONESUR NEGATIVE 04/10/2021 0504   PROTEINUR 30 (A) 04/10/2021 0504   UROBILINOGEN 0.2 03/19/2013 1636   NITRITE NEGATIVE 04/10/2021 0504   LEUKOCYTESUR NEGATIVE 04/10/2021 0504    FURTHER DISCHARGE INSTRUCTIONS:   Get Medicines reviewed and adjusted: Please take all your medications with you for your next visit with your Primary MD   Laboratory/radiological data: Please request your Primary MD to go over all hospital tests and procedure/radiological results at the follow up, please ask your Primary MD to get all Hospital records sent to his/her office.   In some cases, they will be blood work, cultures and biopsy results pending at the time of your discharge. Please request that your primary care M.D. goes through all the records of your hospital data and follows up on these results.   Also Note the following: If you experience worsening of your admission symptoms, develop shortness of breath, life threatening emergency, suicidal or homicidal thoughts you must seek medical attention  immediately by calling 911 or calling your MD immediately  if symptoms less severe.   You must read complete instructions/literature along with all the possible adverse reactions/side effects for all the Medicines you take and that have been prescribed to you. Take any new Medicines after you have completely understood and accpet all the possible adverse reactions/side effects.    Do not drive when taking Pain medications or sleeping medications (Benzodaizepines)   Do not take more than prescribed Pain, Sleep and Anxiety Medications. It is not advisable to combine anxiety,sleep and pain medications without talking with your primary care practitioner   Special Instructions: If you have smoked or chewed Tobacco  in the last 2 yrs please stop smoking, stop any regular Alcohol  and or any Recreational drug use.   Wear Seat belts while driving.   Please note: You were cared for by a hospitalist during your hospital stay. Once you are discharged, your primary care physician will handle any further medical issues. Please note that NO REFILLS for any discharge medications will be authorized once you are discharged, as it is imperative that you return to your primary care physician (or establish a relationship with a primary care physician if you do not have one) for your post hospital discharge needs so that they can reassess your need for medications and monitor your lab values.  Time coordinating discharge: 40 minutes  SIGNED:  Marzetta Board, MD, PhD 04/12/2021, 11:00 AM

## 2021-04-19 ENCOUNTER — Telehealth: Payer: Self-pay | Admitting: Interventional Cardiology

## 2021-04-19 NOTE — Telephone Encounter (Signed)
I spoke with patient's son and scheduled patient to see Dr Irish Lack on October 3,2022 at 10:00

## 2021-04-19 NOTE — Telephone Encounter (Signed)
Patient's son calling to request patient be worked in for an appointment, because the patient was seen in the hospital recently. He states the patient had a full work up and an echo done. Patient is scheduled in January and I did not see anything sooner.

## 2021-04-25 NOTE — Progress Notes (Signed)
Cardiology Office Note   Date:  04/26/2021   ID:  Luke [name], DOB 03/02/1933, MRN [id number]  PCP:  Aretta Nip, MD    No chief complaint on file.  CAD  Wt Readings from Last 3 Encounters:  04/26/21 210 lb 6.4 oz (95.4 kg)  04/12/21 210 lb 1.6 oz (95.3 kg)  11/23/20 246 lb 0.5 oz (111.6 kg)       History of Present Illness: Luke [name] is a 87 y.o. male  with history of CAD (with MI/CABG in 9/99 at [facility], [place] with [place] to LAD, LIMA to OM, SVG to PDA; LAD drug eluting stent in 3/09 through the graft to the distal LAD), DM, stroke, chronic diastolic CHF, CKD stage III, HTN, HLD, hiatal hernia, COPD with hypoxia who presents for f/u of SOB.   To recap last cardiac studies, last echo 2011 did not report out LVEF. Last stress test 2013 showed mild ischemia in apical lateral region, EF 70%,    Saw  Dr. Lamonte Sakai who has titrated his regimen for COPD. Pulmonary function testing confirmed severe obstruction with a positive bronchodilator response. He was started on Stiolto but at f/u with pulm was unsure if this had either helped or if he was even taking correctly. He was willing to try Anoro instead. Dr. Agustina Caroli notes indicated he felt the patient would likely qualify for home O2 but the patient wished to avoid this.  In September 2022, Had a fall leading to hospitalization due to low blood sugar and Also : "Acute metabolic encephalopathy, suspect mild cognitive impairment which has progressed-there was a component of in-hospital delirium but I do suspect early dementia, mild cognitive impairment.  MRI of the brain was negative for acute intracranial pathology but it did show a remote right parietal lobe infarct as well as parenchymal volume loss and chronic white matter microangiopathy.  If memory issues continue recommend neuropsychiatry testing as an outpatient  Acute kidney injury-Baseline creatinine 1.3-1.6, 1.9 on admission, now normalized."  Echo  in 03/2021 showed: "Left ventricular ejection fraction, by estimation, is 65 to 70%. The  left ventricle has normal function. The left ventricle has no regional  wall motion abnormalities. Indeterminate diastolic filling due to E-A  fusion.   2. Right ventricular systolic function is normal. The right ventricular  size is normal. Tricuspid regurgitation signal is inadequate for assessing  PA pressure.   3. The mitral valve is grossly normal. No evidence of mitral valve  regurgitation. No evidence of mitral stenosis.   4. The aortic valve is tricuspid. There is mild calcification of the  aortic valve. Aortic valve regurgitation is not visualized. Mild aortic  valve sclerosis is present, with no evidence of aortic valve stenosis.   5. The inferior vena cava is normal in size with greater than 50%  respiratory variability, suggesting right atrial pressure of 3 mmHg. "  Feels better with less insulin.    Denies : Chest pain. Dizziness. Leg edema. Nitroglycerin use. Orthopnea. Palpitations. Paroxysmal nocturnal dyspnea. Shortness of breath. Syncope.      Past Medical History:  Diagnosis Date   Anginal pain ([place]) 02/08/12   Arthritis    "all over"   CKD (chronic kidney disease), stage III (HCC)    COPD (chronic obstructive pulmonary disease) (HCC)    Coronary artery disease    a. MI/CABG in 9/99 at [facility], [place] with ([place] to LAD, LIMA to OM, SVG to PDA. b. LAD drug eluting  stent in 3/09 through the graft to the distal LAD.   Exertional dyspnea    "doesn't take much these days"   H/O hiatal hernia    Hyperlipemia    Hypertension    Hypothyroidism    Hypoxia    Stroke ([place]) 09/2010   denies residual   Type II diabetes mellitus ([place])     Past Surgical History:  Procedure Laterality Date   CATARACT EXTRACTION W/ INTRAOCULAR LENS  IMPLANT, BILATERAL  ~ 2007   COLONOSCOPY W/ POLYPECTOMY  09/2011   "removed 7"   CORONARY ANGIOPLASTY WITH STENT PLACEMENT  ~ 2004    "1"   [place]   CABG X4   EYE SURGERY     RETINAL DETACHMENT SURGERY  10/2011   right eye     Current Outpatient Medications  Medication Sig Dispense Refill   acetaminophen (TYLENOL) 500 MG tablet Take 1,000 mg by mouth every 6 (six) hours as needed for mild pain.     aspirin EC 81 MG tablet Take 1 tablet (81 mg total) by mouth daily.     atenolol (TENORMIN) 25 MG tablet Take 25 mg by mouth daily.     atorvastatin (LIPITOR) 20 MG tablet TAKE 1 TABLET (20 MG TOTAL) BY MOUTH DAILY. PLEASE SCHEDULE APPT FOR FUTURE REFILLS. 2ND ATTEMPT 90 tablet 1   Cholecalciferol 50 MCG (2000 UT) TABS Take 1 tablet by mouth daily.     Continuous Blood Gluc Sensor ([place]) MISC 1 each by Does not apply route daily. 1 each 0   cyanocobalamin 1000 MCG tablet Take 1 tablet by mouth daily.     furosemide (LASIX) 80 MG tablet Take 40-80 mg by mouth 2 (two) times daily. Take 2 tablets by mouth in the a.m. and 1 tablet by mouth in the p.m.     insulin aspart (NOVOLOG) 100 UNIT/ML injection Inject 12 Units into the skin daily before lunch. 10 mL 0   insulin detemir (LEVEMIR) 100 UNIT/ML injection Inject 0.12 mLs (12 Units total) into the skin daily. 10 mL 0   Multiple Vitamin (MULTIVITAMIN) capsule Take 1 capsule by mouth daily.     nitroGLYCERIN (NITROSTAT) 0.4 MG SL tablet DISSOLVE 1 TABLET UNDER THE TONGUE EVERY 5 MINUTES AS NEEDED FOR CHEST PAIN 25 tablet 4   polyvinyl alcohol (LIQUIFILM TEARS) 1.4 % ophthalmic solution Place 1 drop into both eyes as needed (For dry eyes).     umeclidinium bromide (INCRUSE ELLIPTA) 62.5 MCG/INH AEPB Inhale 1 puff into the lungs daily as needed for wheezing.     No current facility-administered medications for this visit.    Allergies:   Patient has no known allergies.    Social History:  The patient  reports that he quit smoking about 27 years ago. His smoking use included cigarettes. He has a 70.00 pack-year smoking history.  He quit smokeless tobacco use about 25 years ago. He reports that he does not drink alcohol and does not use drugs.   Family History:  The patient's family history includes Heart attack in his mother.    ROS:  Please see the history of present illness.   Otherwise, review of systems are positive for low blood sugars recently.   All other systems are reviewed and negative.    PHYSICAL EXAM: VS:  BP (!) 120/50   Pulse 84   Ht 5\' 11"  (1.803 m)   Wt 210 lb 6.4 oz (95.4 kg)   SpO2 94%  BMI 29.34 kg/m  , BMI Body mass index is 29.34 kg/m. GEN: Well nourished, well developed, in no acute distress HEENT: normal Neck: no JVD, carotid bruits, or masses Cardiac: RRR; no murmurs, rubs, or gallops,no edema  Respiratory:  clear to auscultation bilaterally, normal work of breathing GI: soft, nontender, nondistended, + BS, obese MS: no deformity or atrophy Skin: warm and dry, no rash Neuro:  Strength and sensation are intact Psych: euthymic mood, full affect   EKG:   The ekg ordered today demonstrates NSR, NSST   Recent Labs: 04/10/2021: B Natriuretic Peptide 159.4 04/11/2021: TSH 4.225 04/12/2021: ALT 28; BUN 30; Creatinine, Ser 1.45; Hemoglobin 14.0; Platelets 179; Potassium 3.7; Sodium 139   Lipid Panel No results found for: CHOL, TRIG, HDL, CHOLHDL, VLDL, LDLCALC, LDLDIRECT   Other studies Reviewed: Additional studies/ records that were reviewed today with results demonstrating: hospital rcords reviewed.   ASSESSMENT AND PLAN:  CAD: No angina on aggressive medical therapy. Refill SL NTG. Known circ disease.  Medical therapy. No indication for PCI.  DOE: some deconditioning. HTN: The current medical regimen is effective;  continue present plan and medications. Hyperlipidemia: Start rosuvastatin 20 mg daily.  [id number] in 10/2020.  Needs labs in 3 months, liver lipids.  He stopped statin in the past because he thought it "was not doing anything." He is willing to take it.   CKD stage  III: Stay hydrated.  Avoid NSAIDs and dehydration. Dilated aorta: Slight, 3.8 cm.  No need for further imaging of aorta.    Current medicines are reviewed at length with the patient today.  The patient concerns regarding his medicines were addressed.  The following changes have been made:  No change  Labs/ tests ordered today include:  No orders of the defined types were placed in this encounter.   Recommend 150 minutes/week of aerobic exercise Low fat, low carb, high fiber diet recommended  Disposition:   FU in 1 yr   Signed, Larae Grooms, MD  04/26/2021 10:20 AM    [facility] [place], [employer], [place]  [address] Phone: [phone]; Fax: [phone]

## 2021-04-26 ENCOUNTER — Ambulatory Visit (INDEPENDENT_AMBULATORY_CARE_PROVIDER_SITE_OTHER): Payer: Medicare Other | Admitting: Interventional Cardiology

## 2021-04-26 ENCOUNTER — Encounter: Payer: Self-pay | Admitting: Interventional Cardiology

## 2021-04-26 ENCOUNTER — Other Ambulatory Visit: Payer: Self-pay

## 2021-04-26 VITALS — BP 120/50 | HR 84 | Ht 71.0 in | Wt 210.4 lb

## 2021-04-26 DIAGNOSIS — I77810 Thoracic aortic ectasia: Secondary | ICD-10-CM | POA: Diagnosis not present

## 2021-04-26 DIAGNOSIS — I25118 Atherosclerotic heart disease of native coronary artery with other forms of angina pectoris: Secondary | ICD-10-CM | POA: Diagnosis not present

## 2021-04-26 DIAGNOSIS — I5032 Chronic diastolic (congestive) heart failure: Secondary | ICD-10-CM | POA: Diagnosis not present

## 2021-04-26 DIAGNOSIS — I10 Essential (primary) hypertension: Secondary | ICD-10-CM | POA: Diagnosis not present

## 2021-04-26 DIAGNOSIS — E782 Mixed hyperlipidemia: Secondary | ICD-10-CM | POA: Diagnosis not present

## 2021-04-26 MED ORDER — ROSUVASTATIN CALCIUM 20 MG PO TABS: 20.0000 mg | ORAL_TABLET | Freq: Every day | ORAL | 3 refills | Status: DC

## 2021-04-26 MED ORDER — NITROGLYCERIN 0.4 MG SL SUBL: SUBLINGUAL_TABLET | SUBLINGUAL | 6 refills | Status: DC

## 2021-04-26 NOTE — Patient Instructions (Signed)
Medication Instructions:  Your physician has recommended you make the following change in your medication: Start Rosuvastatin 20 mg by mouth daily.  (Make sure you are no longer taking Atorvastatin)  *If you need a refill on your cardiac medications before your next appointment, please call your pharmacy*   Lab Work: Have fasting lipid and liver profiles done in January at endocrinology or primary care.  You have a prescription for this If you have labs (blood work) drawn today and your tests are completely normal, you will receive your results only by: MyChart Message (if you have MyChart) OR A paper copy in the mail If you have any lab test that is abnormal or we need to change your treatment, we will call you to review the results.   Testing/Procedures: none   Follow-Up: At [facility], you and your health needs are our priority.  As part of our continuing mission to provide you with exceptional heart care, we have created designated Provider Care Teams.  These Care Teams include your primary Cardiologist (physician) and Advanced Practice Providers (APPs -  Physician Assistants and Nurse Practitioners) who all work together to provide you with the care you need, when you need it.  We recommend signing up for the patient portal called "MyChart".  Sign up information is provided on this After Visit Summary.  MyChart is used to connect with patients for Virtual Visits (Telemedicine).  Patients are able to view lab/test results, encounter notes, upcoming appointments, etc.  Non-urgent messages can be sent to your provider as well.   To learn more about what you can do with MyChart, go to [email or web address].    Your next appointment:   12 month(s)  The format for your next appointment:   In Person  Provider:   You may see Larae Grooms, MD or one of the following Advanced Practice Providers on your designated Care Team:   Melina Copa, PA-C Ermalinda Barrios, PA-C   Other  Instructions

## 2021-04-30 DIAGNOSIS — E1169 Type 2 diabetes mellitus with other specified complication: Secondary | ICD-10-CM | POA: Diagnosis not present

## 2021-04-30 DIAGNOSIS — N1831 Chronic kidney disease, stage 3a: Secondary | ICD-10-CM | POA: Diagnosis not present

## 2021-04-30 DIAGNOSIS — Z87891 Personal history of nicotine dependence: Secondary | ICD-10-CM | POA: Diagnosis not present

## 2021-04-30 DIAGNOSIS — I129 Hypertensive chronic kidney disease with stage 1 through stage 4 chronic kidney disease, or unspecified chronic kidney disease: Secondary | ICD-10-CM | POA: Diagnosis not present

## 2021-04-30 DIAGNOSIS — E039 Hypothyroidism, unspecified: Secondary | ICD-10-CM | POA: Diagnosis not present

## 2021-04-30 DIAGNOSIS — E1165 Type 2 diabetes mellitus with hyperglycemia: Secondary | ICD-10-CM | POA: Diagnosis not present

## 2021-04-30 DIAGNOSIS — Z794 Long term (current) use of insulin: Secondary | ICD-10-CM | POA: Diagnosis not present

## 2021-04-30 DIAGNOSIS — E1122 Type 2 diabetes mellitus with diabetic chronic kidney disease: Secondary | ICD-10-CM | POA: Diagnosis not present

## 2021-04-30 DIAGNOSIS — E11641 Type 2 diabetes mellitus with hypoglycemia with coma: Secondary | ICD-10-CM | POA: Diagnosis not present

## 2021-04-30 DIAGNOSIS — E785 Hyperlipidemia, unspecified: Secondary | ICD-10-CM | POA: Diagnosis not present

## 2021-04-30 DIAGNOSIS — T383X5A Adverse effect of insulin and oral hypoglycemic [antidiabetic] drugs, initial encounter: Secondary | ICD-10-CM | POA: Diagnosis not present

## 2021-04-30 DIAGNOSIS — E1142 Type 2 diabetes mellitus with diabetic polyneuropathy: Secondary | ICD-10-CM | POA: Diagnosis not present

## 2021-05-27 DIAGNOSIS — E559 Vitamin D deficiency, unspecified: Secondary | ICD-10-CM | POA: Diagnosis not present

## 2021-05-27 DIAGNOSIS — E039 Hypothyroidism, unspecified: Secondary | ICD-10-CM | POA: Diagnosis not present

## 2021-05-27 DIAGNOSIS — Z794 Long term (current) use of insulin: Secondary | ICD-10-CM | POA: Diagnosis not present

## 2021-05-27 DIAGNOSIS — R351 Nocturia: Secondary | ICD-10-CM | POA: Diagnosis not present

## 2021-05-27 DIAGNOSIS — E785 Hyperlipidemia, unspecified: Secondary | ICD-10-CM | POA: Diagnosis not present

## 2021-05-27 DIAGNOSIS — Z125 Encounter for screening for malignant neoplasm of prostate: Secondary | ICD-10-CM | POA: Diagnosis not present

## 2021-05-27 DIAGNOSIS — E11641 Type 2 diabetes mellitus with hypoglycemia with coma: Secondary | ICD-10-CM | POA: Diagnosis not present

## 2021-05-27 DIAGNOSIS — N1831 Chronic kidney disease, stage 3a: Secondary | ICD-10-CM | POA: Diagnosis not present

## 2021-05-27 DIAGNOSIS — E1122 Type 2 diabetes mellitus with diabetic chronic kidney disease: Secondary | ICD-10-CM | POA: Diagnosis not present

## 2021-05-27 DIAGNOSIS — E1169 Type 2 diabetes mellitus with other specified complication: Secondary | ICD-10-CM | POA: Diagnosis not present

## 2021-06-01 DIAGNOSIS — Z794 Long term (current) use of insulin: Secondary | ICD-10-CM | POA: Diagnosis not present

## 2021-06-01 DIAGNOSIS — Z87891 Personal history of nicotine dependence: Secondary | ICD-10-CM | POA: Diagnosis not present

## 2021-06-01 DIAGNOSIS — E1122 Type 2 diabetes mellitus with diabetic chronic kidney disease: Secondary | ICD-10-CM | POA: Diagnosis not present

## 2021-06-01 DIAGNOSIS — E1159 Type 2 diabetes mellitus with other circulatory complications: Secondary | ICD-10-CM | POA: Diagnosis not present

## 2021-06-01 DIAGNOSIS — N1831 Chronic kidney disease, stage 3a: Secondary | ICD-10-CM | POA: Diagnosis not present

## 2021-06-01 DIAGNOSIS — E1142 Type 2 diabetes mellitus with diabetic polyneuropathy: Secondary | ICD-10-CM | POA: Diagnosis not present

## 2021-06-01 DIAGNOSIS — E669 Obesity, unspecified: Secondary | ICD-10-CM | POA: Diagnosis not present

## 2021-06-01 DIAGNOSIS — E785 Hyperlipidemia, unspecified: Secondary | ICD-10-CM | POA: Diagnosis not present

## 2021-06-01 DIAGNOSIS — E1149 Type 2 diabetes mellitus with other diabetic neurological complication: Secondary | ICD-10-CM | POA: Diagnosis not present

## 2021-06-01 DIAGNOSIS — E1165 Type 2 diabetes mellitus with hyperglycemia: Secondary | ICD-10-CM | POA: Diagnosis not present

## 2021-06-01 DIAGNOSIS — I129 Hypertensive chronic kidney disease with stage 1 through stage 4 chronic kidney disease, or unspecified chronic kidney disease: Secondary | ICD-10-CM | POA: Diagnosis not present

## 2021-06-01 DIAGNOSIS — E1169 Type 2 diabetes mellitus with other specified complication: Secondary | ICD-10-CM | POA: Diagnosis not present

## 2021-08-11 ENCOUNTER — Ambulatory Visit: Payer: Medicare Other | Admitting: Physician Assistant

## 2021-08-24 DIAGNOSIS — I129 Hypertensive chronic kidney disease with stage 1 through stage 4 chronic kidney disease, or unspecified chronic kidney disease: Secondary | ICD-10-CM | POA: Diagnosis not present

## 2021-08-24 DIAGNOSIS — E11641 Type 2 diabetes mellitus with hypoglycemia with coma: Secondary | ICD-10-CM | POA: Diagnosis not present

## 2021-08-24 DIAGNOSIS — E782 Mixed hyperlipidemia: Secondary | ICD-10-CM | POA: Diagnosis not present

## 2021-08-24 DIAGNOSIS — Z794 Long term (current) use of insulin: Secondary | ICD-10-CM | POA: Diagnosis not present

## 2021-08-24 DIAGNOSIS — E1122 Type 2 diabetes mellitus with diabetic chronic kidney disease: Secondary | ICD-10-CM | POA: Diagnosis not present

## 2021-08-24 DIAGNOSIS — N1831 Chronic kidney disease, stage 3a: Secondary | ICD-10-CM | POA: Diagnosis not present

## 2021-08-26 DIAGNOSIS — M1712 Unilateral primary osteoarthritis, left knee: Secondary | ICD-10-CM | POA: Diagnosis not present

## 2021-08-26 DIAGNOSIS — M1711 Unilateral primary osteoarthritis, right knee: Secondary | ICD-10-CM | POA: Diagnosis not present

## 2021-08-27 ENCOUNTER — Inpatient Hospital Stay (HOSPITAL_COMMUNITY)
Admission: EM | Admit: 2021-08-27 | Discharge: 2021-08-30 | DRG: 603 | Disposition: A | Discharge status: Home / Self Care | Payer: Medicare Other | Attending: Family Medicine | Admitting: Family Medicine

## 2021-08-27 ENCOUNTER — Encounter (HOSPITAL_COMMUNITY): Payer: Self-pay

## 2021-08-27 ENCOUNTER — Other Ambulatory Visit: Payer: Self-pay

## 2021-08-27 ENCOUNTER — Emergency Department (HOSPITAL_COMMUNITY): Payer: Medicare Other

## 2021-08-27 DIAGNOSIS — Z87891 Personal history of nicotine dependence: Secondary | ICD-10-CM

## 2021-08-27 DIAGNOSIS — L03115 Cellulitis of right lower limb: Secondary | ICD-10-CM | POA: Diagnosis present

## 2021-08-27 DIAGNOSIS — I5032 Chronic diastolic (congestive) heart failure: Secondary | ICD-10-CM | POA: Diagnosis present

## 2021-08-27 DIAGNOSIS — I252 Old myocardial infarction: Secondary | ICD-10-CM

## 2021-08-27 DIAGNOSIS — N1831 Chronic kidney disease, stage 3a: Secondary | ICD-10-CM | POA: Diagnosis not present

## 2021-08-27 DIAGNOSIS — E1165 Type 2 diabetes mellitus with hyperglycemia: Secondary | ICD-10-CM | POA: Diagnosis present

## 2021-08-27 DIAGNOSIS — L02419 Cutaneous abscess of limb, unspecified: Secondary | ICD-10-CM | POA: Diagnosis not present

## 2021-08-27 DIAGNOSIS — L02416 Cutaneous abscess of left lower limb: Secondary | ICD-10-CM | POA: Diagnosis not present

## 2021-08-27 DIAGNOSIS — E782 Mixed hyperlipidemia: Secondary | ICD-10-CM | POA: Diagnosis present

## 2021-08-27 DIAGNOSIS — Z8249 Family history of ischemic heart disease and other diseases of the circulatory system: Secondary | ICD-10-CM | POA: Diagnosis not present

## 2021-08-27 DIAGNOSIS — Z20822 Contact with and (suspected) exposure to covid-19: Secondary | ICD-10-CM | POA: Diagnosis present

## 2021-08-27 DIAGNOSIS — Z8673 Personal history of transient ischemic attack (TIA), and cerebral infarction without residual deficits: Secondary | ICD-10-CM

## 2021-08-27 DIAGNOSIS — E11319 Type 2 diabetes mellitus with unspecified diabetic retinopathy without macular edema: Secondary | ICD-10-CM | POA: Diagnosis not present

## 2021-08-27 DIAGNOSIS — Z955 Presence of coronary angioplasty implant and graft: Secondary | ICD-10-CM | POA: Diagnosis not present

## 2021-08-27 DIAGNOSIS — E119 Type 2 diabetes mellitus without complications: Secondary | ICD-10-CM

## 2021-08-27 DIAGNOSIS — Z79899 Other long term (current) drug therapy: Secondary | ICD-10-CM

## 2021-08-27 DIAGNOSIS — E039 Hypothyroidism, unspecified: Secondary | ICD-10-CM | POA: Diagnosis present

## 2021-08-27 DIAGNOSIS — I10 Essential (primary) hypertension: Secondary | ICD-10-CM | POA: Diagnosis present

## 2021-08-27 DIAGNOSIS — N183 Chronic kidney disease, stage 3 unspecified: Secondary | ICD-10-CM | POA: Diagnosis present

## 2021-08-27 DIAGNOSIS — N1832 Chronic kidney disease, stage 3b: Secondary | ICD-10-CM

## 2021-08-27 DIAGNOSIS — I2510 Atherosclerotic heart disease of native coronary artery without angina pectoris: Secondary | ICD-10-CM | POA: Diagnosis present

## 2021-08-27 DIAGNOSIS — I130 Hypertensive heart and chronic kidney disease with heart failure and stage 1 through stage 4 chronic kidney disease, or unspecified chronic kidney disease: Secondary | ICD-10-CM | POA: Diagnosis present

## 2021-08-27 DIAGNOSIS — Z789 Other specified health status: Secondary | ICD-10-CM

## 2021-08-27 DIAGNOSIS — Z7982 Long term (current) use of aspirin: Secondary | ICD-10-CM

## 2021-08-27 DIAGNOSIS — L03119 Cellulitis of unspecified part of limb: Secondary | ICD-10-CM | POA: Diagnosis not present

## 2021-08-27 DIAGNOSIS — L03116 Cellulitis of left lower limb: Secondary | ICD-10-CM | POA: Diagnosis present

## 2021-08-27 DIAGNOSIS — J449 Chronic obstructive pulmonary disease, unspecified: Secondary | ICD-10-CM | POA: Diagnosis present

## 2021-08-27 DIAGNOSIS — E1122 Type 2 diabetes mellitus with diabetic chronic kidney disease: Secondary | ICD-10-CM | POA: Diagnosis present

## 2021-08-27 DIAGNOSIS — M7989 Other specified soft tissue disorders: Secondary | ICD-10-CM | POA: Diagnosis not present

## 2021-08-27 DIAGNOSIS — I5033 Acute on chronic diastolic (congestive) heart failure: Secondary | ICD-10-CM | POA: Diagnosis present

## 2021-08-27 DIAGNOSIS — I129 Hypertensive chronic kidney disease with stage 1 through stage 4 chronic kidney disease, or unspecified chronic kidney disease: Secondary | ICD-10-CM | POA: Diagnosis not present

## 2021-08-27 DIAGNOSIS — Z951 Presence of aortocoronary bypass graft: Secondary | ICD-10-CM

## 2021-08-27 DIAGNOSIS — E1142 Type 2 diabetes mellitus with diabetic polyneuropathy: Secondary | ICD-10-CM | POA: Diagnosis not present

## 2021-08-27 DIAGNOSIS — Z794 Long term (current) use of insulin: Secondary | ICD-10-CM

## 2021-08-27 LAB — TSH: TSH: 0.965 u[IU]/mL (ref 0.350–4.500)

## 2021-08-27 LAB — CBC WITH DIFFERENTIAL/PLATELET
Abs Immature Granulocytes: 0.08 10*3/uL — ABNORMAL HIGH (ref 0.00–0.07)
Basophils Absolute: 0 10*3/uL (ref 0.0–0.1)
Basophils Relative: 0 %
Eosinophils Absolute: 0 10*3/uL (ref 0.0–0.5)
Eosinophils Relative: 0 %
HCT: 44.5 % (ref 39.0–52.0)
Hemoglobin: 14.5 g/dL (ref 13.0–17.0)
Immature Granulocytes: 1 %
Lymphocytes Relative: 7 %
Lymphs Abs: 0.7 10*3/uL (ref 0.7–4.0)
MCH: 29.4 pg (ref 26.0–34.0)
MCHC: 32.6 g/dL (ref 30.0–36.0)
MCV: 90.1 fL (ref 80.0–100.0)
Monocytes Absolute: 0.4 10*3/uL (ref 0.1–1.0)
Monocytes Relative: 4 %
Neutro Abs: 8.8 10*3/uL — ABNORMAL HIGH (ref 1.7–7.7)
Neutrophils Relative %: 88 %
Platelets: 345 10*3/uL (ref 150–400)
RBC: 4.94 MIL/uL (ref 4.22–5.81)
RDW: 11.9 % (ref 11.5–15.5)
WBC: 10 10*3/uL (ref 4.0–10.5)
nRBC: 0 % (ref 0.0–0.2)

## 2021-08-27 LAB — CREATININE, SERUM
Creatinine, Ser: 1.53 mg/dL — ABNORMAL HIGH (ref 0.61–1.24)
GFR, Estimated: 44 mL/min — ABNORMAL LOW (ref >60)

## 2021-08-27 LAB — BASIC METABOLIC PANEL
Anion gap: 11 (ref 5–15)
BUN: 67 mg/dL — ABNORMAL HIGH (ref 8–23)
CO2: 32 mmol/L (ref 22–32)
Calcium: 9.4 mg/dL (ref 8.9–10.3)
Chloride: 93 mmol/L — ABNORMAL LOW (ref 98–111)
Creatinine, Ser: 1.64 mg/dL — ABNORMAL HIGH (ref 0.61–1.24)
GFR, Estimated: 40 mL/min — ABNORMAL LOW (ref >60)
Glucose, Bld: 293 mg/dL — ABNORMAL HIGH (ref 70–99)
Potassium: 4.3 mmol/L (ref 3.5–5.1)
Sodium: 136 mmol/L (ref 135–145)

## 2021-08-27 LAB — CBC
HCT: 42.6 % (ref 39.0–52.0)
Hemoglobin: 14 g/dL (ref 13.0–17.0)
MCH: 30 pg (ref 26.0–34.0)
MCHC: 32.9 g/dL (ref 30.0–36.0)
MCV: 91.2 fL (ref 80.0–100.0)
Platelets: 306 10*3/uL (ref 150–400)
RBC: 4.67 MIL/uL (ref 4.22–5.81)
RDW: 11.9 % (ref 11.5–15.5)
WBC: 10.2 10*3/uL (ref 4.0–10.5)
nRBC: 0 % (ref 0.0–0.2)

## 2021-08-27 LAB — HEMOGLOBIN A1C
Hgb A1c MFr Bld: 8.5 % — ABNORMAL HIGH (ref 4.8–5.6)
Mean Plasma Glucose: 197.25 mg/dL

## 2021-08-27 LAB — RESP PANEL BY RT-PCR (FLU A&B, COVID) ARPGX2
Influenza A by PCR: NEGATIVE
Influenza B by PCR: NEGATIVE
SARS Coronavirus 2 by RT PCR: NEGATIVE

## 2021-08-27 LAB — T4, FREE: Free T4: 1.01 ng/dL (ref 0.61–1.12)

## 2021-08-27 MED ORDER — FUROSEMIDE 40 MG PO TABS: 40.0000 mg | ORAL_TABLET | Freq: Two times a day (BID) | ORAL | Status: DC

## 2021-08-27 MED ORDER — UMECLIDINIUM BROMIDE 62.5 MCG/ACT IN AEPB: 1.0000 | INHALATION_SPRAY | Freq: Every day | RESPIRATORY_TRACT | Status: DC | PRN

## 2021-08-27 MED ORDER — VANCOMYCIN HCL 1500 MG/300ML IV SOLN
1500.0000 mg | Freq: Once | INTRAVENOUS | Status: AC
  Administered 2021-08-27: 1500 mg via INTRAVENOUS
  Filled 2021-08-27: qty 300

## 2021-08-27 MED ORDER — INSULIN ASPART 100 UNIT/ML IJ SOLN
0.0000 [IU] | Freq: Three times a day (TID) | INTRAMUSCULAR | Status: DC
  Administered 2021-08-28: 5 [IU] via SUBCUTANEOUS
  Administered 2021-08-28: 2 [IU] via SUBCUTANEOUS
  Administered 2021-08-28: 12 [IU] via SUBCUTANEOUS
  Administered 2021-08-29 – 2021-08-30 (×4): 3 [IU] via SUBCUTANEOUS
  Filled 2021-08-27: qty 0.09

## 2021-08-27 MED ORDER — ROSUVASTATIN CALCIUM 20 MG PO TABS
20.0000 mg | ORAL_TABLET | Freq: Every day | ORAL | Status: DC
  Administered 2021-08-28 – 2021-08-30 (×3): 20 mg via ORAL
  Filled 2021-08-27 (×3): qty 1

## 2021-08-27 MED ORDER — ACETAMINOPHEN 325 MG PO TABS: 650.0000 mg | ORAL_TABLET | Freq: Four times a day (QID) | ORAL | Status: DC | PRN

## 2021-08-27 MED ORDER — ENOXAPARIN SODIUM 40 MG/0.4ML IJ SOSY
40.0000 mg | PREFILLED_SYRINGE | INTRAMUSCULAR | Status: DC
  Administered 2021-08-27 – 2021-08-29 (×3): 40 mg via SUBCUTANEOUS
  Filled 2021-08-27 (×3): qty 0.4

## 2021-08-27 MED ORDER — INSULIN DETEMIR 100 UNIT/ML ~~LOC~~ SOLN
10.0000 [IU] | Freq: Every day | SUBCUTANEOUS | Status: DC
  Filled 2021-08-27: qty 0.1

## 2021-08-27 MED ORDER — VANCOMYCIN HCL IN DEXTROSE 1-5 GM/200ML-% IV SOLN
1000.0000 mg | INTRAVENOUS | Status: DC
  Administered 2021-08-28 – 2021-08-29 (×2): 1000 mg via INTRAVENOUS
  Filled 2021-08-27 (×3): qty 200

## 2021-08-27 MED ORDER — INSULIN ASPART 100 UNIT/ML IJ SOLN
6.0000 [IU] | Freq: Once | INTRAMUSCULAR | Status: AC
  Administered 2021-08-27: 6 [IU] via INTRAVENOUS
  Filled 2021-08-27: qty 0.06

## 2021-08-27 MED ORDER — SODIUM CHLORIDE 0.9 % IV SOLN: INTRAVENOUS | Status: DC

## 2021-08-27 MED ORDER — INSULIN DETEMIR 100 UNIT/ML ~~LOC~~ SOLN
10.0000 [IU] | Freq: Every day | SUBCUTANEOUS | Status: DC
Start: 2021-08-27 — End: 2021-08-27

## 2021-08-27 MED ORDER — LIDOCAINE-EPINEPHRINE (PF) 2 %-1:200000 IJ SOLN
10.0000 mL | Freq: Once | INTRAMUSCULAR | Status: AC
  Administered 2021-08-27: 10 mL
  Filled 2021-08-27: qty 20

## 2021-08-27 MED ORDER — NITROGLYCERIN 0.4 MG SL SUBL: 0.4000 mg | SUBLINGUAL_TABLET | SUBLINGUAL | Status: DC | PRN

## 2021-08-27 MED ORDER — ASPIRIN EC 81 MG PO TBEC
81.0000 mg | DELAYED_RELEASE_TABLET | Freq: Every day | ORAL | Status: DC
  Administered 2021-08-28 – 2021-08-30 (×3): 81 mg via ORAL
  Filled 2021-08-27 (×3): qty 1

## 2021-08-27 MED ORDER — ACETAMINOPHEN 650 MG RE SUPP: 650.0000 mg | Freq: Four times a day (QID) | RECTAL | Status: DC | PRN

## 2021-08-27 MED ORDER — ONDANSETRON HCL 4 MG PO TABS: 4.0000 mg | ORAL_TABLET | Freq: Four times a day (QID) | ORAL | Status: DC | PRN

## 2021-08-27 MED ORDER — CEFAZOLIN SODIUM-DEXTROSE 1-4 GM/50ML-% IV SOLN
1.0000 g | Freq: Once | INTRAVENOUS | Status: AC
  Administered 2021-08-27: 1 g via INTRAVENOUS
  Filled 2021-08-27: qty 50

## 2021-08-27 MED ORDER — CEFAZOLIN SODIUM-DEXTROSE 1-4 GM/50ML-% IV SOLN
1.0000 g | Freq: Three times a day (TID) | INTRAVENOUS | Status: DC
  Administered 2021-08-27 – 2021-08-29 (×5): 1 g via INTRAVENOUS
  Filled 2021-08-27 (×5): qty 50

## 2021-08-27 MED ORDER — VITAMIN B-12 1000 MCG PO TABS
1000.0000 ug | ORAL_TABLET | Freq: Every day | ORAL | Status: DC
  Administered 2021-08-28 – 2021-08-30 (×3): 1000 ug via ORAL
  Filled 2021-08-27 (×3): qty 1

## 2021-08-27 MED ORDER — POLYVINYL ALCOHOL 1.4 % OP SOLN: 1.0000 [drp] | OPHTHALMIC | Status: DC | PRN

## 2021-08-27 MED ORDER — ONDANSETRON HCL 4 MG/2ML IJ SOLN: 4.0000 mg | Freq: Four times a day (QID) | INTRAMUSCULAR | Status: DC | PRN

## 2021-08-27 MED ORDER — SODIUM CHLORIDE 0.9 % IV SOLN: INTRAVENOUS | Status: AC

## 2021-08-27 MED ORDER — ATENOLOL 25 MG PO TABS: 25.0000 mg | ORAL_TABLET | Freq: Every day | ORAL | Status: DC

## 2021-08-27 MED ORDER — DOCUSATE SODIUM 100 MG PO CAPS
100.0000 mg | ORAL_CAPSULE | Freq: Two times a day (BID) | ORAL | Status: DC
  Administered 2021-08-27 – 2021-08-30 (×6): 100 mg via ORAL
  Filled 2021-08-27 (×6): qty 1

## 2021-08-27 NOTE — ED Provider Notes (Signed)
[facility] DEPT Provider Note   CSN: [id number] Arrival date & time: 08/27/21  1218     History  No chief complaint on file.   Luke [name] is a 88 y.o. male.  Pt is a 88 yo wm with a hx of CAD, HTN, hyperlipidemia, DM, hypothyroidism, CKD (stage 3), and COPD.  Pt has had redness to his left leg for 2 weeks.  He was seen by his orthopedist 10 days ago and was put on keflex.  Pt's son said the redness is a little better on the left leg, but now he has redness on the right leg and a yellow spot to his left ankle.  Pt denies any f/c.      Home Medications Prior to Admission medications   Medication Sig Start Date End Date Taking? Authorizing Provider  acetaminophen (TYLENOL) 500 MG tablet Take 1,000 mg by mouth every 6 (six) hours as needed for mild pain.    [provider]  aspirin EC 81 MG tablet Take 1 tablet (81 mg total) by mouth daily. 10/21/15   Jettie Booze, MD  atenolol (TENORMIN) 25 MG tablet Take 25 mg by mouth daily. 11/30/20   [provider]  Cholecalciferol 50 MCG (2000 UT) TABS Take 1 tablet by mouth daily. 02/26/21 02/26/22  [provider]  Continuous Blood Gluc Sensor ([place]) MISC 1 each by Does not apply route daily. 04/12/21   Caren Griffins, MD  cyanocobalamin 1000 MCG tablet Take 1 tablet by mouth daily.    [provider]  furosemide (LASIX) 80 MG tablet Take 40-80 mg by mouth 2 (two) times daily. Take 2 tablets by mouth in the a.m. and 1 tablet by mouth in the p.m.    [provider]  insulin aspart (NOVOLOG) 100 UNIT/ML injection Inject 12 Units into the skin daily before lunch. 04/12/21   Caren Griffins, MD  insulin detemir (LEVEMIR) 100 UNIT/ML injection Inject 0.12 mLs (12 Units total) into the skin daily. 04/12/21   Caren Griffins, MD  Multiple Vitamin (MULTIVITAMIN) capsule Take 1 capsule by mouth daily.    [provider]  nitroGLYCERIN  (NITROSTAT) 0.4 MG SL tablet DISSOLVE 1 TABLET UNDER THE TONGUE EVERY 5 MINUTES AS NEEDED FOR CHEST PAIN 04/26/21   Jettie Booze, MD  polyvinyl alcohol (LIQUIFILM TEARS) 1.4 % ophthalmic solution Place 1 drop into both eyes as needed (For dry eyes).    [provider]  rosuvastatin (CRESTOR) 20 MG tablet Take 1 tablet (20 mg total) by mouth daily. 04/26/21   Jettie Booze, MD  umeclidinium bromide (INCRUSE ELLIPTA) 62.5 MCG/INH AEPB Inhale 1 puff into the lungs daily as needed for wheezing.    [provider]      Allergies    Patient has no known allergies.    Review of Systems   Review of Systems  Skin:  Positive for color change, rash and wound.  All other systems reviewed and are negative.  Physical Exam Updated Vital Signs BP (!) 170/75    Pulse 81    Temp 98.3 F (36.8 C) (Oral)    Resp 15    Ht 5\' 11"  (1.803 m)    Wt 95.3 kg    SpO2 94%    BMI 29.29 kg/m  Physical Exam Vitals and nursing note reviewed.  Constitutional:      Appearance: Normal appearance.  HENT:     Head: Normocephalic and atraumatic.  Right Ear: External ear normal.     Left Ear: External ear normal.     Nose: Nose normal.     Mouth/Throat:     Mouth: Mucous membranes are dry.  Eyes:     Extraocular Movements: Extraocular movements intact.     Conjunctiva/sclera: Conjunctivae normal.     Pupils: Pupils are equal, round, and reactive to light.  Cardiovascular:     Rate and Rhythm: Normal rate and regular rhythm.     Pulses: Normal pulses.     Heart sounds: Normal heart sounds.  Pulmonary:     Effort: Pulmonary effort is normal.     Breath sounds: Normal breath sounds.  Abdominal:     General: Abdomen is flat. Bowel sounds are normal.     Palpations: Abdomen is soft.  Musculoskeletal:        General: Normal range of motion.     Cervical back: Normal range of motion and neck supple.  Skin:    General: Skin is warm.     Capillary Refill: Capillary refill takes less  than 2 seconds.     Comments: Pt has cellulitis to both legs.  Left leg worse than right.  Pt has 2 abscesses to the lateral aspect of his left ankle and foot.  See pictures.  Neurological:     General: No focal deficit present.     Mental Status: He is alert and oriented to person, place, and time.  Psychiatric:        Mood and Affect: Mood normal.        Behavior: Behavior normal.       ED Results / Procedures / Treatments   Labs (all labs ordered are listed, but only abnormal results are displayed) Labs Reviewed  BASIC METABOLIC PANEL - Abnormal; Notable for the following components:      Result Value   Chloride 93 (*)    Glucose, Bld 293 (*)    BUN 67 (*)    Creatinine, Ser 1.64 (*)    GFR, Estimated 40 (*)    All other components within normal limits  CBC WITH DIFFERENTIAL/PLATELET - Abnormal; Notable for the following components:   Neutro Abs 8.8 (*)    Abs Immature Granulocytes 0.08 (*)    All other components within normal limits  AEROBIC CULTURE W GRAM STAIN (SUPERFICIAL SPECIMEN)  RESP PANEL BY RT-PCR (FLU A&B, COVID) [id number]    EKG None  Radiology DG Ankle 2 Views Left  Result Date: 08/27/2021 CLINICAL DATA:  Open wound in the skin pain EXAM: LEFT ANKLE - 2 VIEW COMPARISON:  None. FINDINGS: No fracture or dislocation is seen. There are no focal lytic lesions. Osteopenia is seen in bony structures. Sclerotic density in the lateral malleolus may suggest bone island. There is no break in the cortical margins. Arterial calcifications are seen in soft tissues. Plantar spur is seen in calcaneus. There is soft tissue swelling around the ankle. IMPRESSION: No fracture or dislocation is seen. There are no focal lytic lesions. If there is clinical suspicion for osteomyelitis, follow-up MRI may be considered. Electronically Signed   By: Elmer Picker M.D.   On: 08/27/2021 13:53    Procedures .Marland KitchenIncision and Drainage  Date/Time: 08/27/2021 4:20 PM Performed by:  Isla Pence, MD Authorized by: Isla Pence, MD   Consent:    Consent obtained:  Verbal   Consent given by:  Patient   Risks, benefits, and alternatives were discussed: yes     Risks discussed:  Bleeding,  incomplete drainage and pain   Alternatives discussed:  No treatment Universal protocol:    Patient identity confirmed:  Verbally with patient Location:    Type:  Abscess   Size:  4   Location:  Lower extremity   Lower extremity location:  Ankle   Ankle location:  L ankle Pre-procedure details:    Skin preparation:  Povidone-iodine Sedation:    Sedation type:  None Anesthesia:    Anesthesia method:  Local infiltration   Local anesthetic:  Lidocaine 2% WITH epi Procedure type:    Complexity:  Simple Procedure details:    Ultrasound guidance: no     Incision types:  Cruciate   Wound management:  Probed and deloculated   Drainage:  Purulent   Drainage amount:  Moderate   Wound treatment:  Wound left open Post-procedure details:    Procedure completion:  Tolerated well, no immediate complications    Medications Ordered in ED Medications  0.9 %  sodium chloride infusion ( Intravenous New Bag/Given 08/27/21 1548)  vancomycin (VANCOREADY) IVPB 1500 mg/300 mL (1,500 mg Intravenous New Bag/Given 08/27/21 1621)  insulin aspart (novoLOG) injection 6 Units (has no administration in time range)  ceFAZolin (ANCEF) IVPB 1 g/50 mL premix (0 g Intravenous Stopped 08/27/21 1622)  lidocaine-EPINEPHrine (XYLOCAINE W/EPI) 2 %-1:200000 (PF) injection 10 mL (10 mLs Infiltration Given by Other 08/27/21 1607)    ED Course/ Medical Decision Making/ A&P                           Medical Decision Making Risk Prescription drug management. Decision regarding hospitalization.   Pt's cellulitis is worsening despite oral keflex.  He now has cellulitis to his right leg.  He has also developed abscesses.  Abscesses were drained.  Wound cultures were sent.  Pt put on vancomycin and ancef.  Pt is  hyperglycemic.  He is given IV insulin.  CKD is chronic.  Pt d/w Dr. Dwyane Dee (triad) for admission.         Final Clinical Impression(s) / ED Diagnoses Final diagnoses:  Cellulitis of left lower extremity  Cellulitis of right lower extremity  Abscess of ankle  Failure of outpatient treatment  Hyperglycemia due to diabetes mellitus ([place])  Stage 3b chronic kidney disease ([place])    Rx / DC Orders ED Discharge Orders     None         Isla Pence, MD 08/27/21 1628

## 2021-08-27 NOTE — Progress Notes (Signed)
A consult was received from an ED physician for vancomycin per pharmacy dosing.  The patient's profile has been reviewed for ht/wt/allergies/indication/available labs.   A one time order has been placed for vancomycin 1500mg .  Further antibiotics/pharmacy consults should be ordered by admitting physician if indicated.                       Thank you, Peggyann Juba, PharmD, BCPS 08/27/2021  3:28 PM

## 2021-08-27 NOTE — ED Triage Notes (Addendum)
Patient's son reports that the patient has had left leg redness and swelling x 10 days. Patient has been on antibiotics.x 10 days. The son reports there is decreased swelling. Patient has a yellow area on the left outer ankle that is draining a small amount of yellow drainage.

## 2021-08-27 NOTE — ED Notes (Signed)
Pt care taken, sitting up eating supper, family at bedside. No complaints at this time

## 2021-08-27 NOTE — H&P (Addendum)
History and Physical    Luke [name] OIZ:[id number] DOB: 1933/03/10 DOA: 08/27/2021  PCP: Aretta Nip, MD   Patient coming from:  Home  I have personally briefly reviewed patient's old medical records in [place]  Chief Complaint: Left leg pain, swelling and redness.  HPI: Luke [name] is a 88 y.o. male with PMH significant for COPD, CAD, hypertension, hyperlipidemia, CKD stage IIIb, hypothyroidism, DM II presented in the ED with complaints of left leg pain, swelling and redness for 2 weeks.  Patient reports he has seen his podiatrist 10 days ago and was placed on Keflex.  Patient completed the course for 10 days, reports he has not seen any improvement, in fact has developed more redness, swelling and worsening pain.  He denies any fever, chills at home.  He reports has developed small fluid collections behind the left ankle, redness has spread from ankle to the left leg below left knee. He described pain as 7/ 10 on the pain scale, Sharp and burning.  He denies any trauma, injury, fall.  He went to see his PCP today and was sent in the ED.  ED Course: He was hemodynamically stable except hypertension. HR 81, RR 15, BP 170/75, SPO2 94% on room air, temp 98.3 Labs include sodium 136, potassium 4.3, chloride 93, bicarb 32, glucose 293, BUN 67, creatinine 1.64, calcium 9.4, anion gap 11, WBC 10.0, hemoglobin 14.5, hematocrit 44.5, MCV 90.1, platelet 345, respiratory panel pending. X-ray left ankle: No fracture or dislocation is seen. There are no focal lytic  lesions. If there is clinical suspicion for osteomyelitis, follow-up MRI may be considered.   Review of Systems: Review of Systems  Constitutional: Negative.   HENT: Negative.    Eyes: Negative.   Respiratory: Negative.    Cardiovascular: Negative.   Gastrointestinal: Negative.   Genitourinary: Negative.   Musculoskeletal:        Left leg redness, swelling, tenderness, 2 small abscesses which were drained.   Skin: Negative.   Neurological: Negative.   Endo/Heme/Allergies: Negative.   Psychiatric/Behavioral: Negative.     Past Medical History:  Diagnosis Date   Anginal pain ([place]) 02/08/12   Arthritis    "all over"   CKD (chronic kidney disease), stage III (HCC)    COPD (chronic obstructive pulmonary disease) (HCC)    Coronary artery disease    a. MI/CABG in 9/99 at [facility], [place] with (RIMA to LAD, LIMA to OM, SVG to PDA. b. LAD drug eluting stent in 3/09 through the graft to the distal LAD.   Exertional dyspnea    "doesn't take much these days"   H/O hiatal hernia    Hyperlipemia    Hypertension    Hypothyroidism    Hypoxia    Stroke [facility]) 09/2010   denies residual   Type II diabetes mellitus ([place])     Past Surgical History:  Procedure Laterality Date   CATARACT EXTRACTION W/ INTRAOCULAR LENS  IMPLANT, BILATERAL  ~ 2007   COLONOSCOPY W/ POLYPECTOMY  09/2011   "removed 7"   CORONARY ANGIOPLASTY WITH STENT PLACEMENT  ~ 2004   "1"   [place]   CABG X4   EYE SURGERY     RETINAL DETACHMENT SURGERY  10/2011   right eye     reports that he quit smoking about 28 years ago. His smoking use included cigarettes. He has a 70.00 pack-year smoking history. He quit smokeless tobacco use about 26 years ago.  He reports that he does not drink alcohol and does not use drugs.  No Known Allergies  Family History  Problem Relation Age of Onset   Heart attack Mother    Family history reviewed and not pertinent.  Prior to Admission medications   Medication Sig Start Date End Date Taking? Authorizing Provider  acetaminophen (TYLENOL) 500 MG tablet Take 1,000 mg by mouth every 6 (six) hours as needed for mild pain.    [provider]  aspirin EC 81 MG tablet Take 1 tablet (81 mg total) by mouth daily. 10/21/15   Jettie Booze, MD  atenolol (TENORMIN) 25 MG tablet Take 25 mg by mouth daily. 11/30/20   [provider]   Cholecalciferol 50 MCG (2000 UT) TABS Take 1 tablet by mouth daily. 02/26/21 02/26/22  [provider]  Continuous Blood Gluc Sensor ([place]) MISC 1 each by Does not apply route daily. 04/12/21   Caren Griffins, MD  cyanocobalamin 1000 MCG tablet Take 1 tablet by mouth daily.    [provider]  furosemide (LASIX) 80 MG tablet Take 40-80 mg by mouth 2 (two) times daily. Take 2 tablets by mouth in the a.m. and 1 tablet by mouth in the p.m.    [provider]  insulin aspart (NOVOLOG) 100 UNIT/ML injection Inject 12 Units into the skin daily before lunch. 04/12/21   Caren Griffins, MD  insulin detemir (LEVEMIR) 100 UNIT/ML injection Inject 0.12 mLs (12 Units total) into the skin daily. 04/12/21   Caren Griffins, MD  Multiple Vitamin (MULTIVITAMIN) capsule Take 1 capsule by mouth daily.    [provider]  nitroGLYCERIN (NITROSTAT) 0.4 MG SL tablet DISSOLVE 1 TABLET UNDER THE TONGUE EVERY 5 MINUTES AS NEEDED FOR CHEST PAIN 04/26/21   Jettie Booze, MD  polyvinyl alcohol (LIQUIFILM TEARS) 1.4 % ophthalmic solution Place 1 drop into both eyes as needed (For dry eyes).    [provider]  rosuvastatin (CRESTOR) 20 MG tablet Take 1 tablet (20 mg total) by mouth daily. 04/26/21   Jettie Booze, MD  umeclidinium bromide (INCRUSE ELLIPTA) 62.5 MCG/INH AEPB Inhale 1 puff into the lungs daily as needed for wheezing.    [provider]    Physical Exam: Vitals:   08/27/21 1239 08/27/21 1300 08/27/21 1504 08/27/21 1600  BP: (!) 142/59  (!) 144/108 (!) 170/75  Pulse: 69   81  Resp: 18  16 15   Temp: 98.3 F (36.8 C)     TempSrc: Oral     SpO2: 92%   94%  Weight:  95.3 kg    Height:  5\' 11"  (1.803 m)      Constitutional: Appears comfortable, not in any acute distress. Vitals:   08/27/21 1239 08/27/21 1300 08/27/21 1504 08/27/21 1600  BP: (!) 142/59  (!) 144/108 (!) 170/75  Pulse: 69   81  Resp: 18  16 15    Temp: 98.3 F (36.8 C)     TempSrc: Oral     SpO2: 92%   94%  Weight:  95.3 kg    Height:  5\' 11"  (1.803 m)     Eyes: PERRL, lids and conjunctivae normal ENMT: Mucous membranes are moist. Posterior pharynx without exudate.  Normal dentition.  Neck: normal, supple, no masses, no thyromegaly Respiratory: Clear to auscultation bilaterally, no wheezing, no crackles.  No accessory muscle use.  Cardiovascular: S1-S2 heard, regular rate and rhythm, no murmur. Abdomen: Soft, nontender, nondistended, BS+ Musculoskeletal: Left  leg erythematous, warm, tender, swollen, 2 small abscesses drained., dressing noted.  Normal muscle tone.  Skin: no rashes, lesions, ulcers. No induration Neurologic: CN 2-12 grossly intact. Sensation intact, DTR normal. Strength 5/5 in all 4.  Psychiatric: Normal judgment and insight. Alert and oriented x 3. Normal mood.    Labs on Admission: I have personally reviewed following labs and imaging studies  CBC: Recent Labs  Lab 08/27/21 1338  WBC 10.0  NEUTROABS 8.8*  HGB 14.5  HCT 44.5  MCV 90.1  PLT 563   Basic Metabolic Panel: Recent Labs  Lab 08/27/21 1338  NA 136  K 4.3  CL 93*  CO2 32  GLUCOSE 293*  BUN 67*  CREATININE 1.64*  CALCIUM 9.4   GFR: Estimated Creatinine Clearance: 38.1 mL/min (A) (by C-G formula based on SCr of 1.64 mg/dL (H)). Liver Function Tests: No results for input(s): AST, ALT, ALKPHOS, BILITOT, PROT, ALBUMIN in the last 168 hours. No results for input(s): LIPASE, AMYLASE in the last 168 hours. No results for input(s): AMMONIA in the last 168 hours. Coagulation Profile: No results for input(s): INR, PROTIME in the last 168 hours. Cardiac Enzymes: No results for input(s): CKTOTAL, CKMB, CKMBINDEX, TROPONINI in the last 168 hours. BNP (last 3 results) No results for input(s): PROBNP in the last 8760 hours. HbA1C: No results for input(s): HGBA1C in the last 72 hours. CBG: No results for input(s): GLUCAP in the last 168  hours. Lipid Profile: No results for input(s): CHOL, HDL, LDLCALC, TRIG, CHOLHDL, LDLDIRECT in the last 72 hours. Thyroid Function Tests: No results for input(s): TSH, T4TOTAL, FREET4, T3FREE, THYROIDAB in the last 72 hours. Anemia Panel: No results for input(s): VITAMINB12, FOLATE, FERRITIN, TIBC, IRON, RETICCTPCT in the last 72 hours. Urine analysis:    Component Value Date/Time   COLORURINE YELLOW (A) 04/10/2021 0504   APPEARANCEUR CLEAR (A) 04/10/2021 0504   LABSPEC 1.010 04/10/2021 0504   PHURINE 6.0 04/10/2021 0504   GLUCOSEU NEGATIVE 04/10/2021 0504   HGBUR SMALL (A) 04/10/2021 0504   BILIRUBINUR NEGATIVE 04/10/2021 0504   KETONESUR NEGATIVE 04/10/2021 0504   PROTEINUR 30 (A) 04/10/2021 0504   UROBILINOGEN 0.2 03/19/2013 1636   NITRITE NEGATIVE 04/10/2021 0504   LEUKOCYTESUR NEGATIVE 04/10/2021 0504    Radiological Exams on Admission: DG Ankle 2 Views Left  Result Date: 08/27/2021 CLINICAL DATA:  Open wound in the skin pain EXAM: LEFT ANKLE - 2 VIEW COMPARISON:  None. FINDINGS: No fracture or dislocation is seen. There are no focal lytic lesions. Osteopenia is seen in bony structures. Sclerotic density in the lateral malleolus may suggest bone island. There is no break in the cortical margins. Arterial calcifications are seen in soft tissues. Plantar spur is seen in calcaneus. There is soft tissue swelling around the ankle. IMPRESSION: No fracture or dislocation is seen. There are no focal lytic lesions. If there is clinical suspicion for osteomyelitis, follow-up MRI may be considered. Electronically Signed   By: Elmer Picker M.D.   On: 08/27/2021 13:53    EKG: Ordered. please review EKG.  Assessment/Plan Principal Problem:   Recurrent cellulitis of lower leg Active Problems:   DM (diabetes mellitus) (HCC)   Coronary atherosclerosis of native coronary artery   Mixed hyperlipidemia   Chronic diastolic heart failure (HCC)   COPD (chronic obstructive pulmonary  disease) (HCC)   Primary hypothyroidism   Stage 3a chronic kidney disease ([place])   Type 2 diabetes mellitus with hyperglycemia, with long-term current use of insulin (HCC)  Left leg  cellulitis: Patient reports having left leg cellulitis for 2 weeks. X-ray showed no fracture, no dislocation.  No focal lytic lesion. He has not seen any improvement despite being on Keflex for 10 days. He has developed 2 small abscesses which were drained in the ED. Continue IV antibiotics, IV Ancef and vancomycin. Follow blood cultures and wound cultures. Continue adequate pain control Continue IV gentle hydration.  CAD: Continue cardioprotective medications. Continue aspirin, atenolol, rosuvastatin. Denies any chest pain,  shortness of breath or palpitations.  Chronic diastolic CHF: Not in Exacerbation. Appears Euvolumic Continue Lasix 80 mg every 12 hr Last echocardiogram 8/22: showed LVEF 60 to 65%.  CKD stage IIIb: Baseline serum creatinine between 1.6-1.7. Remains at baseline.  COPD : Stable, not in exacerbation. Continue home inhalers.  Hypothyroidism: Patient not on levothyroxine. Obtain TSH, T3,T4  Hyperlipidemia: Continue rosuvastatin.  Diabetes mellitus type 2: Obtain hemoglobin A1c, Start regular insulin sliding scale, Lantus 10 units at bedtime.    DVT prophylaxis: Lovenox Code Status: Full code Family Communication: No family at bedside Disposition Plan:   Status is: Inpatient Remains inpatient appropriate because: Admitted for left leg cellulitis requiring IV antibiotics,  failed outpatient treatment.  Consults called: None Admission status: Inpatient   Shawna Clamp MD [facility]   If 7PM-7AM, please contact night-coverage   08/27/2021, 5:25 PM

## 2021-08-27 NOTE — ED Provider Triage Note (Signed)
Emergency Medicine Provider Triage Evaluation Note  Luke [name] , a 88 y.o. male  was evaluated in triage.  Pt complains of wound to ankle.  Patient is accompanied by son who states that for 2 weeks he has had redness and swelling to his left ankle and foot.  He states that he was seen by orthopedist about 10 days ago and placed on Keflex.  He states that Keflex has not improved his symptoms and has had worsening redness, swelling.  He has had a new white patch to the lateral malleoli region with white to yellow drainage which prompted visit today.  He denies any fevers, nausea or vomiting, decreased appetite..  Review of Systems  Positive:  Negative:   Physical Exam  BP (!) 142/59 (BP Location: Right Arm)    Pulse 69    Temp 98.3 F (36.8 C) (Oral)    Resp 18    Ht 5\' 11"  (1.803 m)    Wt 95.3 kg    SpO2 92%    BMI 29.29 kg/m  Gen:   Awake, no distress   Resp:  Normal effort  MSK:   Moves extremities without difficulty  Other:  Left lower extremity from mid shin down is red, swollen.  DP pulse 2+.  There is a 2 cm area of white purulence to the lateral malleoli region.  No other wounds noted.  Medical Decision Making  Medically screening exam initiated at 1:22 PM.  Appropriate orders placed.  Florene Route Tenorio was informed that the remainder of the evaluation will be completed by another provider, this initial triage assessment does not replace that evaluation, and the importance of remaining in the ED until their evaluation is complete.     Mickie Hillier, PA-C 08/27/21 1324

## 2021-08-27 NOTE — Progress Notes (Signed)
Pharmacy Antibiotic Note  Luke [name] is a 88 y.o. male admitted on 08/27/2021 with cellulitis.  Pharmacy has been consulted for vancomycin dosing.  Plan: Vancomycin 1500mg  IV x 1, then 1g IV q24h for estimated AUC 441 using SCr 1.64 Check vancomycin levels as needed, goal AUC 400-550 Follow up renal function & cultures  Height: 5\' 11"  (180.3 cm) Weight: 95.3 kg (210 lb) IBW/kg (Calculated) : 75.3  Temp (24hrs), Avg:98.3 F (36.8 C), Min:98.3 F (36.8 C), Max:98.3 F (36.8 C)  Recent Labs  Lab 08/27/21 1338  WBC 10.0  CREATININE 1.64*    Estimated Creatinine Clearance: 38.1 mL/min (A) (by C-G formula based on SCr of 1.64 mg/dL (H)).    No Known Allergies  Antimicrobials this admission: 2/3 Vanc >> 2/3 Ancef >>  Dose adjustments this admission:  Microbiology results: 2/3 L leg wound:  Thank you for allowing pharmacy to be a part of this patients care.  Peggyann Juba, PharmD, BCPS Pharmacy: [phone] 08/27/2021 4:51 PM

## 2021-08-28 DIAGNOSIS — L03119 Cellulitis of unspecified part of limb: Secondary | ICD-10-CM | POA: Diagnosis not present

## 2021-08-28 LAB — CBC
HCT: 42 % (ref 39.0–52.0)
Hemoglobin: 13.6 g/dL (ref 13.0–17.0)
MCH: 29.8 pg (ref 26.0–34.0)
MCHC: 32.4 g/dL (ref 30.0–36.0)
MCV: 92.1 fL (ref 80.0–100.0)
Platelets: 312 10*3/uL (ref 150–400)
RBC: 4.56 MIL/uL (ref 4.22–5.81)
RDW: 11.9 % (ref 11.5–15.5)
WBC: 11.2 10*3/uL — ABNORMAL HIGH (ref 4.0–10.5)
nRBC: 0 % (ref 0.0–0.2)

## 2021-08-28 LAB — COMPREHENSIVE METABOLIC PANEL
ALT: 22 U/L (ref 0–44)
AST: 31 U/L (ref 15–41)
Albumin: 3.1 g/dL — ABNORMAL LOW (ref 3.5–5.0)
Alkaline Phosphatase: 72 U/L (ref 38–126)
Anion gap: 10 (ref 5–15)
BUN: 60 mg/dL — ABNORMAL HIGH (ref 8–23)
CO2: 27 mmol/L (ref 22–32)
Calcium: 8.3 mg/dL — ABNORMAL LOW (ref 8.9–10.3)
Chloride: 96 mmol/L — ABNORMAL LOW (ref 98–111)
Creatinine, Ser: 1.66 mg/dL — ABNORMAL HIGH (ref 0.61–1.24)
GFR, Estimated: 40 mL/min — ABNORMAL LOW (ref >60)
Glucose, Bld: 333 mg/dL — ABNORMAL HIGH (ref 70–99)
Potassium: 4.4 mmol/L (ref 3.5–5.1)
Sodium: 133 mmol/L — ABNORMAL LOW (ref 135–145)
Total Bilirubin: 1.1 mg/dL (ref 0.3–1.2)
Total Protein: 6.3 g/dL — ABNORMAL LOW (ref 6.5–8.1)

## 2021-08-28 LAB — T3, FREE: T3, Free: 1.5 pg/mL — ABNORMAL LOW (ref 2.0–4.4)

## 2021-08-28 LAB — PHOSPHORUS: Phosphorus: 4.5 mg/dL (ref 2.5–4.6)

## 2021-08-28 LAB — GLUCOSE, CAPILLARY
Glucose-Capillary: 402 mg/dL — ABNORMAL HIGH (ref 70–99)
Glucose-Capillary: 180 mg/dL — ABNORMAL HIGH (ref 70–99)
Glucose-Capillary: 183 mg/dL — ABNORMAL HIGH (ref 70–99)

## 2021-08-28 LAB — MAGNESIUM: Magnesium: 2.4 mg/dL (ref 1.7–2.4)

## 2021-08-28 LAB — CBG MONITORING, ED: Glucose-Capillary: 285 mg/dL — ABNORMAL HIGH (ref 70–99)

## 2021-08-28 MED ORDER — SODIUM CHLORIDE 0.9 % IV SOLN: INTRAVENOUS | Status: DC | PRN

## 2021-08-28 MED ORDER — INSULIN DETEMIR 100 UNIT/ML ~~LOC~~ SOLN
12.0000 [IU] | Freq: Every day | SUBCUTANEOUS | Status: DC
  Administered 2021-08-28 – 2021-08-29 (×2): 12 [IU] via SUBCUTANEOUS
  Filled 2021-08-28 (×2): qty 0.12

## 2021-08-28 MED ORDER — HYDRALAZINE HCL 20 MG/ML IJ SOLN
10.0000 mg | Freq: Four times a day (QID) | INTRAMUSCULAR | Status: DC | PRN
  Administered 2021-08-28 – 2021-08-30 (×3): 10 mg via INTRAVENOUS
  Filled 2021-08-28 (×3): qty 1

## 2021-08-28 NOTE — ED Notes (Signed)
Hospitalist at the bedside 

## 2021-08-28 NOTE — Assessment & Plan Note (Addendum)
Continue cardioprotective medications. Continue aspirin, atenolol, rosuvastatin. Denies any chest pain, shortness of breath, palpitations or dizziness.

## 2021-08-28 NOTE — Assessment & Plan Note (Addendum)
Baseline serum creatinine between 1.6-1.7. Serum creatinine remains at baseline.   Avoid nephrotoxic medications.

## 2021-08-28 NOTE — ED Notes (Signed)
Report received from Harlin Rain, RN.

## 2021-08-28 NOTE — Assessment & Plan Note (Addendum)
Patient presented with left leg redness, swelling,  warmth and pain for 2 weeks.   He was prescribed Keflex for 10 days. X-ray showed No acute fracture or dislocation,  No focal lytic lesions. Patient has developed 2 small abscesses which were drained in the ED. Continue antibiotics( IV Ancef and vancomycin).  Wound culture : Rare G+ cocci Redness and cellulitis and much improved. Patient is being discharged home on doxycycline for 6 more days to complete 10-day treatment

## 2021-08-28 NOTE — Assessment & Plan Note (Signed)
Continue rosuvastatin.  

## 2021-08-28 NOTE — Assessment & Plan Note (Addendum)
TSH normal, free T4 1.10. Recheck thyroid profile in 4 to 6-weeks.

## 2021-08-28 NOTE — Plan of Care (Signed)
°  Problem: Education: Goal: Knowledge of General Education information will improve Description: Including pain rating scale, medication(s)/side effects and non-pharmacologic comfort measures Outcome: Progressing   Problem: Activity: Goal: Risk for activity intolerance will decrease Outcome: Progressing   Problem: Coping: Goal: Level of anxiety will decrease Outcome: Progressing   Problem: Safety: Goal: Ability to remain free from injury will improve Outcome: Progressing   Problem: Skin Integrity: Goal: Risk for impaired skin integrity will decrease Outcome: Progressing   Problem: Clinical Measurements: Goal: Ability to avoid or minimize complications of infection will improve Outcome: Progressing

## 2021-08-28 NOTE — Assessment & Plan Note (Addendum)
Not in acute exacerbation, Appears euvolemic. Patient does not take Lasix. Last echocardiogram 8/22 showed LVEF 60 to 65%.

## 2021-08-28 NOTE — Assessment & Plan Note (Addendum)
Continue Lantus 12 units at bedtime,  Continue regular insulin sliding scale,

## 2021-08-28 NOTE — Plan of Care (Signed)
  Problem: Education: Goal: Knowledge of General Education information will improve Description Including pain rating scale, medication(s)/side effects and non-pharmacologic comfort measures Outcome: Progressing   

## 2021-08-28 NOTE — Progress Notes (Addendum)
Progress Note   Patient: Luke [name] WPY:[id number] DOB: 02-16-1933 DOA: 08/27/2021     1  DOS: the patient was seen and examined on 08/28/2021   Brief hospital course: This 88 years old male with PMH significant for COPD, CAD, hypertension, hyperlipidemia, CKD stage IIIb, hypothyroidism, diabetes mellitus type 2 presented in the ED with complaints of left leg pain swelling and redness for 2 weeks.  Patient reported he has seen his podiatrist and was prescribed Keflex for 10 days.  Patient has not seen any improvement despite being on Keflex in fact he has developed more redness swelling and worsening pain.  Patient is admitted for left leg cellulitis.  Xray shows no fracture or dislocation there is no focal lytic lesions.  Assessment and Plan: * Recurrent cellulitis of lower leg- (present on admission) Patient presented with left leg redness swelling warmth and pain for 2 weeks.  He was prescribed Keflex for 10 days. X-ray shows no acute fracture or dislocation no focal lytic lesions. Patient has developed 2 small abscesses which were drained in the ED. Continue antibiotics IV Ancef and vancomycin.  Follow blood cultures and urine cultures.  Continue IV hydration   Stage 3a chronic kidney disease ([place])- (present on admission) Baseline serum creatinine between 1.6-1.7. Serum creatinine remains at baseline.  Avoid nephrotoxic medications.  Primary hypothyroidism- (present on admission) TSH normal, free T41.10. Recheck thyroid profile in 4 to 6-week  Chronic diastolic heart failure ([employer])- (present on admission) Not in acute exacerbation, appears euvolemic. Patient does not take Lasix. Last echocardiogram 8/22 showed LVEF 60 to 65%.  Mixed hyperlipidemia- (present on admission) Continue rosuvastatin.  Coronary atherosclerosis of native coronary artery- (present on admission) Continue cardioprotective medications. Continue aspirin, atenolol, rosuvastatin. Denies any chest pain,  shortness of breath palpitation or dizziness.  DM (diabetes mellitus) (HCC) Continue Lantus 12 units at bedtime,  Continue regular insulin sliding scale,  monitor fingersticks 3 times daily and nightly    Subjective:  Patient was seen and examined at bedside.  Overnight events noted.  Patient reports feeling better.  Left leg swelling is better still has significant amount of erythema tenderness and warmth.  Physical Exam: Vitals:   08/28/21 1100 08/28/21 1102 08/28/21 1150 08/28/21 1309  BP: (!) 183/73  (!) 184/63 (!) 173/61  Pulse: 74  71 66  Resp:   20   Temp:  97.7 F (36.5 C) 97.7 F (36.5 C)   TempSrc:  Oral Oral   SpO2: 96%  97% 96%  Weight:      Height:       Physical Exam Constitutional:      Appearance: Normal appearance.  HENT:     Head: Normocephalic and atraumatic.  Cardiovascular:     Rate and Rhythm: Normal rate and regular rhythm.     Pulses: Normal pulses.     Heart sounds: Normal heart sounds.  Pulmonary:     Effort: Pulmonary effort is normal.     Breath sounds: Normal breath sounds.  Abdominal:     General: Abdomen is flat. Bowel sounds are normal.     Palpations: Abdomen is soft.  Musculoskeletal:        General: Normal range of motion.     Cervical back: Normal range of motion and neck supple.     Comments: Left leg is significantly erythematous, warm, tender, swollen.  Skin:    General: Skin is warm and dry.  Neurological:     General: No focal deficit present.  Mental Status: He is alert and oriented to person, place, and time.  Psychiatric:        Mood and Affect: Mood normal.        Behavior: Behavior normal.     Data Reviewed: I have reviewed vitals, labs, EKG and independently interpreted.  Family Communication:  None  Disposition: Status is: Inpatient Remains inpatient appropriate because: Admitted for left leg cellulitis requiring IV antibiotics.   Planned Discharge Destination: Home   Time spent: 50  minutes  Author: Shawna Clamp, MD 08/28/2021 2:47 PM  For on call review [email or web address].

## 2021-08-28 NOTE — Hospital Course (Addendum)
This 88 years old male with PMH significant for COPD, CAD, hypertension, hyperlipidemia, CKD stage IIIb, hypothyroidism, diabetes mellitus type 2 presented in the ED with complaints of left leg pain, swelling and redness for 2 weeks.  Patient reported he has seen his podiatrist and was prescribed Keflex for 10 days.  Patient has not seen any improvement despite being on Keflex in fact he has developed more redness, swelling and worsening pain.   Patient was admitted for left leg cellulitis.  Xray showed no fracture or dislocation, there was no focal lytic lesions.  Patient was continued on vancomycin and Ancef IV.  Patient had small pea-sized abscesses which were drained in the ED.  Blood cultures no growth so far,  wound culture grew rare gram-positive cocci, sensitivity pending.  Cellulitis has much improved, redness has resolved, Patient feels better and want to be discharged.  Patient is being discharged home on doxycycline 100 mg twice a day for 6 more days to complete 10-day treatment.  Advised to follow-up with primary care physician in 1 week.

## 2021-08-29 DIAGNOSIS — L03119 Cellulitis of unspecified part of limb: Secondary | ICD-10-CM | POA: Diagnosis not present

## 2021-08-29 LAB — GLUCOSE, CAPILLARY
Glucose-Capillary: 220 mg/dL — ABNORMAL HIGH (ref 70–99)
Glucose-Capillary: 214 mg/dL — ABNORMAL HIGH (ref 70–99)
Glucose-Capillary: 223 mg/dL — ABNORMAL HIGH (ref 70–99)
Glucose-Capillary: 181 mg/dL — ABNORMAL HIGH (ref 70–99)

## 2021-08-29 MED ORDER — CEFAZOLIN SODIUM-DEXTROSE 2-4 GM/100ML-% IV SOLN
2.0000 g | Freq: Two times a day (BID) | INTRAVENOUS | Status: DC
  Administered 2021-08-29 – 2021-08-30 (×2): 2 g via INTRAVENOUS
  Filled 2021-08-29 (×2): qty 100

## 2021-08-29 MED ORDER — AMLODIPINE BESYLATE 5 MG PO TABS
5.0000 mg | ORAL_TABLET | Freq: Every day | ORAL | Status: DC
Start: 2021-08-29 — End: 2021-08-29

## 2021-08-29 MED ORDER — INSULIN DETEMIR 100 UNIT/ML ~~LOC~~ SOLN
15.0000 [IU] | Freq: Every day | SUBCUTANEOUS | Status: DC
  Administered 2021-08-30: 15 [IU] via SUBCUTANEOUS
  Filled 2021-08-29: qty 0.15

## 2021-08-29 MED ORDER — POLYVINYL ALCOHOL 1.4 % OP SOLN: 1.0000 [drp] | OPHTHALMIC | Status: DC | PRN

## 2021-08-29 MED ORDER — ATENOLOL 25 MG PO TABS
25.0000 mg | ORAL_TABLET | Freq: Every day | ORAL | Status: DC
  Administered 2021-08-29 – 2021-08-30 (×2): 25 mg via ORAL
  Filled 2021-08-29 (×2): qty 1

## 2021-08-29 NOTE — Assessment & Plan Note (Addendum)
Continue atenolol 25 mg daily. Continue to monitor blood pressure.

## 2021-08-29 NOTE — Progress Notes (Signed)
Progress Note   Patient: Luke [name] [id number]:[id number] DOB: 04/05/1933 DOA: 08/27/2021     2 DOS: the patient was seen and examined on 08/29/2021   Brief hospital course: This 88 years old male with PMH significant for COPD, CAD, hypertension, hyperlipidemia, CKD stage IIIb, hypothyroidism, diabetes mellitus type 2 presented in the ED with complaints of left leg pain swelling and redness for 2 weeks.  Patient reported he has seen his podiatrist and was prescribed Keflex for 10 days.  Patient has not seen any improvement despite being on Keflex in fact he has developed more redness, swelling and worsening pain.  Patient is admitted for left leg cellulitis.  Xray shows no fracture or dislocation,  there is no focal lytic lesions.  Assessment and Plan: * Recurrent cellulitis of lower leg- (present on admission) Patient presented with left leg redness, swelling,  warmth and pain for 2 weeks.   He was prescribed Keflex for 10 days. X-ray showed No acute fracture or dislocation,  No focal lytic lesions. Patient has developed 2 small abscesses which were drained in the ED. Continue antibiotics( IV Ancef and vancomycin).  Wound culture : Rare G+ cocci Continue IV hydration   Stage 3a chronic kidney disease ([place])- (present on admission) Baseline serum creatinine between 1.6-1.7. Serum creatinine remains at baseline.   Avoid nephrotoxic medications.  Primary hypothyroidism- (present on admission) TSH normal, free T4 1.10. Recheck thyroid profile in 4 to 6-weeks.  Essential hypertension, benign- (present on admission) Continue atenolol 25 mg daily. Continue hydralazine 10 mg IV every 6 hours as needed. Continue to monitor blood pressure.  Chronic diastolic heart failure ([place])- (present on admission) Not in acute exacerbation, Appears euvolemic. Patient does not take Lasix. Last echocardiogram 8/22 showed LVEF 60 to 65%.  Mixed hyperlipidemia- (present on admission) Continue  rosuvastatin.  Coronary atherosclerosis of native coronary artery- (present on admission) Continue cardioprotective medications. Continue aspirin, atenolol, rosuvastatin. Denies any chest pain, shortness of breath, palpitations or dizziness.  DM (diabetes mellitus) (HCC) Continue Lantus 12 units at bedtime,  Continue regular insulin sliding scale,  Monitor FS 3 times daily and nightly. Dietitian's consult.   Subjective:  Patient was seen and examined at bedside.  Overnight events noted.   Patient reports that he wants to be discharged. Left leg swelling and redness is improving,  still appears warm and swollen.  Physical Exam: Vitals:   08/28/21 2045 08/29/21 0514 08/29/21 0625 08/29/21 0735  BP: (!) 159/58 (!) 178/75 (!) 176/68 (!) 175/58  Pulse: 67 67 70 75  Resp: 18 18    Temp: 98.1 F (36.7 C) 97.9 F (36.6 C)    TempSrc:      SpO2: 93% 93%    Weight:      Height:      Physical Exam Vitals and nursing note reviewed.  Constitutional:      Appearance: Normal appearance.  HENT:     Head: Atraumatic.  Eyes:     Conjunctiva/sclera: Conjunctivae normal.  Cardiovascular:     Rate and Rhythm: Normal rate and regular rhythm.     Pulses: Normal pulses.     Heart sounds: Normal heart sounds.  Pulmonary:     Effort: Pulmonary effort is normal.     Breath sounds: Normal breath sounds. No wheezing or rales.  Abdominal:     General: Abdomen is flat. Bowel sounds are normal.     Palpations: Abdomen is soft.  Musculoskeletal:     Comments: Left leg: Still erythematous, warm, swollen,  tender.  Neurological:     General: No focal deficit present.     Mental Status: He is alert and oriented to person, place, and time.  Psychiatric:        Mood and Affect: Mood normal.        Behavior: Behavior normal.        Judgment: Judgment normal.     Data Reviewed: All data reviewed did include vitals, nursing notes, labs, medications reviewed, x-rays independently  interpreted.  Family Communication: No family at bedside  Disposition: Status is: Inpatient Remains inpatient appropriate because: Admitted for left leg cellulitis requiring IV antibiotics.  Anticipated discharge home in 1 to 2 days.  Planned Discharge Destination: Home  DVT prophylaxis: Lovenox CODE STATUS: Full code  Time spent: 35 minutes  Author: Shawna Clamp, MD 08/29/2021 9:37 AM  For on call review [email or web address].

## 2021-08-29 NOTE — TOC Progression Note (Signed)
Transition of Care [facility]) - Progression Note    Patient Details  Name: Luke [name] MRN: [id number] Date of Birth: 10/24/1932  Transition of Care [facility]) CM/SW Contact  Ross Ludwig, [place] Phone Number: 08/29/2021, 4:43 PM  Clinical Narrative:      Transition of Care [facility]) Screening Note   Patient Details  Name: Luke [name] Date of Birth: 09-14-1932   Transition of Care [facility]) CM/SW Contact:    Ross Ludwig, LCSW Phone Number: 08/29/2021, 4:43 PM    Transition of Care Department [facility]) has reviewed patient and no TOC needs have been identified at this time. We will continue to monitor patient advancement through interdisciplinary progression rounds. If new patient transition needs arise, please place a TOC consult.          Expected Discharge Plan and Services                                                 Social Determinants of Health (SDOH) Interventions    Readmission Risk Interventions Readmission Risk Prevention Plan 04/12/2021  Transportation Screening Complete  PCP or Specialist Appt within 5-7 Days Complete  Home Care Screening Complete  Medication Review (RN CM) Complete  Some recent data might be hidden

## 2021-08-29 NOTE — Plan of Care (Signed)
  Problem: Education: Goal: Knowledge of General Education information will improve Description Including pain rating scale, medication(s)/side effects and non-pharmacologic comfort measures Outcome: Progressing   Problem: Health Behavior/Discharge Planning: Goal: Ability to manage health-related needs will improve Outcome: Progressing   

## 2021-08-30 DIAGNOSIS — L03119 Cellulitis of unspecified part of limb: Secondary | ICD-10-CM | POA: Diagnosis not present

## 2021-08-30 LAB — AEROBIC CULTURE W GRAM STAIN (SUPERFICIAL SPECIMEN): Special Requests: NORMAL

## 2021-08-30 LAB — BASIC METABOLIC PANEL
Anion gap: 6 (ref 5–15)
BUN: 37 mg/dL — ABNORMAL HIGH (ref 8–23)
CO2: 31 mmol/L (ref 22–32)
Calcium: 8.5 mg/dL — ABNORMAL LOW (ref 8.9–10.3)
Chloride: 102 mmol/L (ref 98–111)
Creatinine, Ser: 1.15 mg/dL (ref 0.61–1.24)
GFR, Estimated: >60 mL/min (ref >60)
Glucose, Bld: 194 mg/dL — ABNORMAL HIGH (ref 70–99)
Potassium: 4 mmol/L (ref 3.5–5.1)
Sodium: 139 mmol/L (ref 135–145)

## 2021-08-30 LAB — GLUCOSE, CAPILLARY
Glucose-Capillary: 222 mg/dL — ABNORMAL HIGH (ref 70–99)
Glucose-Capillary: 229 mg/dL — ABNORMAL HIGH (ref 70–99)

## 2021-08-30 MED ORDER — CEFAZOLIN SODIUM-DEXTROSE 2-4 GM/100ML-% IV SOLN
2.0000 g | Freq: Three times a day (TID) | INTRAVENOUS | Status: DC
  Administered 2021-08-30: 2 g via INTRAVENOUS
  Filled 2021-08-30: qty 100

## 2021-08-30 MED ORDER — ATENOLOL 25 MG PO TABS: 25.0000 mg | ORAL_TABLET | Freq: Every day | ORAL | 1 refills | Status: DC

## 2021-08-30 MED ORDER — DOXYCYCLINE HYCLATE 100 MG PO TBEC: 100.0000 mg | DELAYED_RELEASE_TABLET | Freq: Two times a day (BID) | ORAL | 0 refills | Status: AC

## 2021-08-30 NOTE — Progress Notes (Signed)
Discharge package printed and instructions given to patient and son. Verbalize understanding.

## 2021-08-30 NOTE — Progress Notes (Signed)
Pharmacy Antibiotic Note  Luke [name] is a 88 y.o. male admitted on 08/27/2021 with cellulitis.  Pharmacy has been consulted for vancomycin dosing.  Today 08/30/21  SCr improved to 1.15 Remains afebrile Wound culture growing rare S aureus  Plan: Continue vancomycin 1000 mg IV q 24 hours Though renal function has improved, will wait on sensitivities for S aureus and plan on checking a level if it is MRSA since patient is stable/improving Check vancomycin levels as needed, goal AUC 400-550 Follow up renal function & cultures  Height: 5\' 11"  (180.3 cm) Weight: 95.3 kg (210 lb) IBW/kg (Calculated) : 75.3  Temp (24hrs), Avg:98.2 F (36.8 C), Min:98 F (36.7 C), Max:98.3 F (36.8 C)  Recent Labs  Lab 08/27/21 1338 08/27/21 1636 08/28/21 0600 08/28/21 1200 08/30/21 0319  WBC 10.0 10.2  --  11.2*  --   CREATININE 1.64* 1.53* 1.66*  --  1.15     Estimated Creatinine Clearance: 54.3 mL/min (by C-G formula based on SCr of 1.15 mg/dL).    No Known Allergies  Antimicrobials this admission: 2/3 Vanc >> 2/3 Ancef >>  Dose adjustments this admission:  Microbiology results: 2/3 L leg wound: rare Staph aureus  Thank you for allowing pharmacy to be a part of this patients care.  Ulice Dash, PharmD  08/30/2021 9:08 AM

## 2021-08-30 NOTE — Discharge Instructions (Addendum)
Advised to take doxycycline 100 mg twice daily for 6 more days to complete 10-day treatment for cellulitis. Advised to start atenolol 25 mg daily for hypertension.   Carbohydrate Counting For People With Diabetes  Foods with carbohydrates make your blood glucose level go up. Learning how to count carbohydrates can help you control your blood glucose levels. First, identify the foods you eat that contain carbohydrates. Then, using the Foods with Carbohydrates chart, determine about how much carbohydrates are in your meals and snacks. Make sure you are eating foods with fiber, protein, and healthy fat along with your carbohydrate foods. Foods with Carbohydrates The following table shows carbohydrate foods that have about 15 grams of carbohydrate each. Using measuring cups, spoons, or a food scale when you first begin learning about carbohydrate counting can help you learn about the portion sizes you typically eat. The following foods have 15 grams carbohydrate each:  Grains 1 slice bread (1 ounce)  1 small tortilla (6-inch size)   large bagel (1 ounce)  1/3 cup pasta or rice (cooked)   hamburger or hot dog bun ( ounce)   cup cooked cereal   to  cup ready-to-eat cereal  2 taco shells (5-inch size) Fruit 1 small fresh fruit ( to 1 cup)   medium banana  17 small grapes (3 ounces)  1 cup melon or berries   cup canned or frozen fruit  2 tablespoons dried fruit (blueberries, cherries, cranberries, raisins)   cup unsweetened fruit juice  Starchy Vegetables  cup cooked beans, peas, corn, potatoes/sweet potatoes   large baked potato (3 ounces)  1 cup acorn or butternut squash  Snack Foods 3 to 6 crackers  8 potato chips or 13 tortilla chips ( ounce to 1 ounce)  3 cups popped popcorn  Dairy 3/4 cup (6 ounces) nonfat plain yogurt, or yogurt with sugar-free sweetener  1 cup milk  1 cup plain rice, soy, coconut or flavored almond milk Sweets and Desserts  cup ice cream or frozen  yogurt  1 tablespoon jam, jelly, pancake syrup, table sugar, or honey  2 tablespoons light pancake syrup  1 inch square of frosted cake or 2 inch square of unfrosted cake  2 small cookies (2/3 ounce each) or  large cookie  Sometimes youll have to estimate carbohydrate amounts if you dont know the exact recipe. One cup of mixed foods like soups can have 1 to 2 carbohydrate servings, while some casseroles might have 2 or more servings of carbohydrate. Foods that have less than 20 calories in each serving can be counted as free foods. Count 1 cup raw vegetables, or  cup cooked non-starchy vegetables as free foods. If you eat 3 or more servings at one meal, then count them as 1 carbohydrate serving.  Foods without Carbohydrates  Not all foods contain carbohydrates. Meat, some dairy, fats, non-starchy vegetables, and many beverages dont contain carbohydrate. So when you count carbohydrates, you can generally exclude chicken, pork, beef, fish, seafood, eggs, tofu, cheese, butter, sour cream, avocado, nuts, seeds, olives, mayonnaise, water, black coffee, unsweetened tea, and zero-calorie drinks. Vegetables with no or low carbohydrate include green beans, cauliflower, tomatoes, and onions. How much carbohydrate should I eat at each meal?  Carbohydrate counting can help you plan your meals and manage your weight. Following are some starting points for carbohydrate intake at each meal. Work with your registered dietitian nutritionist to find the best range that works for your blood glucose and weight.   To Lose Weight To  Maintain Weight  Women 2 - 3 carb servings 3 - 4 carb servings  Men 3 - 4 carb servings 4 - 5 carb servings  Checking your blood glucose after meals will help you know if you need to adjust the timing, type, or number of carbohydrate servings in your meal plan. Achieve and keep a healthy body weight by balancing your food intake and physical activity.  Tips How should I plan my  meals?  Plan for half the food on your plate to include non-starchy vegetables, like salad greens, broccoli, or carrots. Try to eat 3 to 5 servings of non-starchy vegetables every day. Have a protein food at each meal. Protein foods include chicken, fish, meat, eggs, or beans (note that beans contain carbohydrate). These two food groups (non-starchy vegetables and proteins) are low in carbohydrate. If you fill up your plate with these foods, you will eat less carbohydrate but still fill up your stomach. Try to limit your carbohydrate portion to  of the plate.  What fats are healthiest to eat?  Diabetes increases risk for heart disease. To help protect your heart, eat more healthy fats, such as olive oil, nuts, and avocado. Eat less saturated fats like butter, cream, and high-fat meats, like bacon and sausage. Avoid trans fats, which are in all foods that list partially hydrogenated oil as an ingredient. What should I drink?  Choose drinks that are not sweetened with sugar. The healthiest choices are water, carbonated or seltzer waters, and tea and coffee without added sugars.  Sweet drinks will make your blood glucose go up very quickly. One serving of soda or energy drink is  cup. It is best to drink these beverages only if your blood glucose is low.  Artificially sweetened, or diet drinks, typically do not increase your blood glucose if they have zero calories in them. Read labels of beverages, as some diet drinks do have carbohydrate and will raise your blood glucose. Label Reading Tips Read Nutrition Facts labels to find out how many grams of carbohydrate are in a food you want to eat. Dont forget: sometimes serving sizes on the label arent the same as how much food you are going to eat, so you may need to calculate how much carbohydrate is in the food you are serving yourself.   Carbohydrate Counting for People with Diabetes Sample 1-Day Menu  Breakfast  cup yogurt, low fat, low sugar (1  carbohydrate serving)   cup cereal, ready-to-eat, unsweetened (1 carbohydrate serving)  1 cup strawberries (1 carbohydrate serving)   cup almonds ( carbohydrate serving)  Lunch 1, 5 ounce can chunk light tuna  2 ounces cheese, low fat cheddar  6 whole wheat crackers (1 carbohydrate serving)  1 small apple (1 carbohydrate servings)   cup carrots ( carbohydrate serving)   cup snap peas  1 cup 1% milk (1 carbohydrate serving)   Evening Meal Stir fry made with: 3 ounces chicken  1 cup brown rice (3 carbohydrate servings)   cup broccoli ( carbohydrate serving)   cup green beans   cup onions  1 tablespoon olive oil  2 tablespoons teriyaki sauce ( carbohydrate serving)  Evening Snack 1 extra small banana (1 carbohydrate serving)  1 tablespoon peanut butter   Carbohydrate Counting for People with Diabetes Vegan Sample 1-Day Menu  Breakfast 1 cup cooked oatmeal (2 carbohydrate servings)   cup blueberries (1 carbohydrate serving)  2 tablespoons flaxseeds  1 cup soymilk fortified with calcium and vitamin D  1  cup coffee  Lunch 2 slices whole wheat bread (2 carbohydrate servings)   cup baked tofu   cup lettuce  2 slices tomato  2 slices avocado   cup baby carrots ( carbohydrate serving)  1 orange (1 carbohydrate serving)  1 cup soymilk fortified with calcium and vitamin D   Evening Meal Burrito made with: 1 6-inch corn tortilla (1 carbohydrate serving)  1 cup refried vegetarian beans (2 carbohydrate servings)   cup chopped tomatoes   cup lettuce   cup salsa  1/3 cup brown rice (1 carbohydrate serving)  1 tablespoon olive oil for rice   cup zucchini   Evening Snack 6 small whole grain crackers (1 carbohydrate serving)  2 apricots ( carbohydrate serving)   cup unsalted peanuts ( carbohydrate serving)    Carbohydrate Counting for People with Diabetes Vegetarian (Lacto-Ovo) Sample 1-Day Menu  Breakfast 1 cup cooked oatmeal (2 carbohydrate servings)   cup  blueberries (1 carbohydrate serving)  2 tablespoons flaxseeds  1 egg  1 cup 1% milk (1 carbohydrate serving)  1 cup coffee  Lunch 2 slices whole wheat bread (2 carbohydrate servings)  2 ounces low-fat cheese   cup lettuce  2 slices tomato  2 slices avocado   cup baby carrots ( carbohydrate serving)  1 orange (1 carbohydrate serving)  1 cup unsweetened tea  Evening Meal Burrito made with: 1 6-inch corn tortilla (1 carbohydrate serving)   cup refried vegetarian beans (1 carbohydrate serving)   cup tomatoes   cup lettuce   cup salsa  1/3 cup brown rice (1 carbohydrate serving)  1 tablespoon olive oil for rice   cup zucchini  1 cup 1% milk (1 carbohydrate serving)  Evening Snack 6 small whole grain crackers (1 carbohydrate serving)  2 apricots ( carbohydrate serving)   cup unsalted peanuts ( carbohydrate serving)    Copyright 2020  Academy of [employer] and Dietetics. All rights reserved.  Using Nutrition Labels: Carbohydrate  Serving Size  Look at the serving size. All the information on the label is based on this portion. Servings Per Container  The number of servings contained in the package. Guidelines for Carbohydrate  Look at the total grams of carbohydrate in the serving size.  1 carbohydrate choice = 15 grams of carbohydrate. Range of Carbohydrate Grams Per Choice  Carbohydrate Grams/Choice Carbohydrate Choices  6-10   11-20 1  21-25 1  26-35 2  36-40 2  41-50 3  51-55 3  56-65 4  66-70 4  71-80 5    Copyright 2020  Academy of Nutrition and Dietetics. All rights reserved.

## 2021-08-30 NOTE — Evaluation (Signed)
Physical Therapy Evaluation Patient Details Name: Luke [name] MRN: [id number] DOB: [birth year] Today's Date: 08/30/2021  History of Present Illness  This 88 years old male with PMH significant for COPD, CAD, hypertension, hyperlipidemia, CKD stage IIIb, hypothyroidism, diabetes mellitus type 2 presented in the ED with complaints of left leg pain swelling and redness for 2 weeks.  bEING TREATED FOR lle CELLULITIS  Clinical Impression  The patient ambulated x 100' using Rw, patient has a standard walker and demonstrated  how he uses the  SW. No further PT needs. Has  family support . Patient to Dc home today, most likely.  PT signing off.     Recommendations for follow up therapy are one component of a multi-disciplinary discharge planning process, led by the attending physician.  Recommendations may be updated based on patient status, additional functional criteria and insurance authorization.  Follow Up Recommendations No PT follow up    Assistance Recommended at Discharge None  Patient can return home with the following  Assist for transportation;Assistance with cooking/housework    Equipment Recommendations None recommended by PT  Recommendations for Other Services       Functional Status Assessment Patient has not had a recent decline in their functional status     Precautions / Restrictions Precautions Precautions: Fall Restrictions Weight Bearing Restrictions: No      Mobility  Bed Mobility               General bed mobility comments: in recliner    Transfers Overall transfer level: Needs assistance Equipment used: Rolling walker (2 wheels) Transfers: Sit to/from Stand Sit to Stand: Supervision           General transfer comment: extra effort to rise , decereased control descent    Ambulation/Gait Ambulation/Gait assistance: Supervision Gait Distance (Feet): 100 Feet Assistive device: Rolling walker (2 wheels) Gait Pattern/deviations: Step-to  pattern, Step-through pattern       General Gait Details: demonstrated  use of RW as a SW  Financial trader Rank[employer] (Stroke Patients Only)       Balance Overall balance assessment: Mild deficits observed, not formally tested                                           Pertinent Vitals/Pain Pain Assessment Pain Assessment: No/denies pain    Home Living Family/patient expects to be discharged to:: Private residence Living Arrangements: Spouse/significant other Available Help at Discharge: Family Type of Home: House Home Access: Ramped entrance       Home Layout: One level Home Equipment: Radio producer - single point      Prior Function Prior Level of Function : Independent/Modified Independent             Mobility Comments: used SW, still drives"I probably shouldn't"       Hand Dominance        Extremity/Trunk Assessment   Upper Extremity Assessment Upper Extremity Assessment: Overall WFL for tasks assessed    Lower Extremity Assessment Lower Extremity Assessment: Overall WFL for tasks assessed LLE Deficits / Details: NOTED REDNESS AND EDEMA       Communication   Communication: No difficulties  Cognition Arousal/Alertness: Awake/alert Behavior During Therapy: WFL for tasks assessed/performed Overall Cognitive Status: No family/caregiver present to determine baseline cognitive functioning Area of  Impairment: Orientation                               General Comments: patient slow to respond to place,  referred to board for date.        General Comments      Exercises     Assessment/Plan    PT Assessment Patient does not need any further PT services  PT Problem List         PT Treatment Interventions      PT Goals (Current goals can be found in the Care Plan section)  Acute Rehab PT Goals Patient Stated Goal: ready to go home PT Goal Formulation: All  assessment and education complete, DC therapy    Frequency       Co-evaluation               AM-PAC PT "6 Clicks" Mobility  Outcome Measure Help needed turning from your back to your side while in a flat bed without using bedrails?: None Help needed moving from lying on your back to sitting on the side of a flat bed without using bedrails?: None Help needed moving to and from a bed to a chair (including a wheelchair)?: A Little Help needed standing up from a chair using your arms (e.g., wheelchair or bedside chair)?: A Little Help needed to walk in hospital room?: A Little Help needed climbing 3-5 steps with a railing? : A Little 6 Click Score: 20    End of Session   Activity Tolerance: Patient tolerated treatment well Patient left: in chair;with call bell/phone within reach;with chair alarm set Nurse Communication: Mobility status PT Visit Diagnosis: Unsteadiness on feet (R26.81)    Time: 3762-8315 PT Time Calculation (min) (ACUTE ONLY): 14 min   Charges:   PT Evaluation $PT Eval Low Complexity: 1 Low          [place] Pager [phone] Office [phone]   Claretha Cooper 08/30/2021, 10:32 AM

## 2021-08-30 NOTE — Progress Notes (Signed)
Initial Nutrition Assessment  DOCUMENTATION CODES:   Not applicable  INTERVENTION:  - will place Carbohydrate Counting for People with Diabetes handout from the Academy of Nutrition and Dietetics in Discharge Instructions/AVS.    NUTRITION DIAGNOSIS:   Increased nutrient needs related to acute illness as evidenced by estimated needs.  GOAL:   Patient will meet greater than or equal to 90% of their needs  MONITOR:   PO intake, Labs, Weight trends  REASON FOR ASSESSMENT:   Malnutrition Screening Tool  ASSESSMENT:   88 y.o. male with medical history of COPD, CAD, HTN, HLD, stage 3 CKD, hypothyroidism, and type 2 DM. he presented to the ED due to L leg pain (7/10 with burning sensation), swelling, and redness for 2 weeks. He was on a 10 day course of Keflex prescribed by Podiatrist which did not provide relief. He went to his PCP who directed him to the ED.  Patient sitting in the chair with no visitors present at the time of RD visit. He had just walked in the hallway with PT.   Review of flow sheet documentation indicates that he has eaten 100% of most meals since admission. He reports having milk, a roll, scrambled eggs, and applesauce for breakfast.   Patient seems to have some periods of slight confusion vs misremembering. He lives at home with his wife and their son is very involved; brings them groceries and ensures that patient's diabetes care is under control.  Patient checks CBGs 2-4 times/day and range is typically 120-180 mg/dl.   He reports losing weight but is unsure of his UBW and is unsure of time frame for weight loss but shares it may have been occurring since he was 88 years old.  Weight on admission date of 2/3 was 210 lb which appears to be a stated weight.  This appears to be stable from vs copied forward from 04/12/21. Weight prior to that was 245 lb on 07/02/19.  Discharge order in for d/c to home. Discharge summary not yet entered. RN confirms patient to  d/c today.    Labs reviewed; HgbA1c: 8.5%, CBGs: 229 and 222 mg/dl, BUN: 37 mg/dl. Ca: 8.5 mg/dl.  Medications reviewed; 100 mg colace BID, sliding scale novolog, 15 units levemir/day, 1000 mcg oral cyanocobalamin/day.     NUTRITION - FOCUSED PHYSICAL EXAM:  Flowsheet Row Most Recent Value  Orbital Region No depletion  Upper Arm Region Mild depletion  Thoracic and Lumbar Region No depletion  Buccal Region No depletion  Temple Region No depletion  Clavicle Bone Region No depletion  Clavicle and Acromion Bone Region Mild depletion  Scapular Bone Region No depletion  Dorsal Hand No depletion  Patellar Region No depletion  Anterior Thigh Region No depletion  Posterior Calf Region No depletion  Edema (RD Assessment) Moderate  [LLE up to knee]  Hair Reviewed  Eyes Reviewed  Mouth Reviewed  Nails Reviewed       Diet Order:   Diet Order             Diet - low sodium heart healthy           Diet Carb Modified           Diet Carb Modified Fluid consistency: Thin; Room service appropriate? Yes  Diet effective now                   EDUCATION NEEDS:   Not appropriate for education at this time  Skin:  Skin Assessment: Reviewed RN Assessment  Last BM:  unknown  Height:   Ht Readings from Last 1 Encounters:  08/27/21 5\' 11"  (1.803 m)    Weight:   Wt Readings from Last 1 Encounters:  08/27/21 95.3 kg     BMI:  Body mass index is 29.29 kg/m.   Estimated Nutritional Needs:  Kcal:  1700-1950 kcal Protein:  85-100 grams Fluid:  >/= 1.8 L/day     Jarome Matin, MS, RD, LDN Inpatient Clinical Dietitian RD pager # available in [place]  After hours/weekend pager # available in [facility]

## 2021-08-30 NOTE — Discharge Summary (Signed)
Physician Discharge Summary   Patient: Luke [name] MRN: [id number] DOB: 1933-02-26  Admit date:     08/27/2021  Discharge date: 08/30/21  Discharge Physician: Shawna Clamp   PCP: Aretta Nip, MD   Recommendations at discharge:  Advised to follow-up with primary care physician in 1 week. Advised to take doxycycline 100 mg twice daily for 6 more days to complete 10-day treatment for cellulitis. Advised to start atenolol 25 mg daily for hypertension. Please check CBC and BMP in 1 week.  Discharge Diagnoses: Principal Problem:   Recurrent cellulitis of lower leg Active Problems:   DM (diabetes mellitus) (HCC)   Coronary atherosclerosis of native coronary artery   Mixed hyperlipidemia   Chronic diastolic heart failure (HCC)   Essential hypertension, benign   COPD (chronic obstructive pulmonary disease) (HCC)   Primary hypothyroidism   Stage 3a chronic kidney disease ([place])   Type 2 diabetes mellitus with hyperglycemia, with long-term current use of insulin (HCC)  Resolved Problems:   * No resolved hospital problems. [facility] Course: This 88 years old male with PMH significant for COPD, CAD, hypertension, hyperlipidemia, CKD stage IIIb, hypothyroidism, diabetes mellitus type 2 presented in the ED with complaints of left leg pain, swelling and redness for 2 weeks.  Patient reported he has seen his podiatrist and was prescribed Keflex for 10 days.  Patient has not seen any improvement despite being on Keflex in fact he has developed more redness, swelling and worsening pain.   Patient was admitted for left leg cellulitis.  Xray showed no fracture or dislocation, there was no focal lytic lesions.  Patient was continued on vancomycin and Ancef IV.  Patient had small pea-sized abscesses which were drained in the ED.  Blood cultures no growth so far,  wound culture grew rare gram-positive cocci, sensitivity pending.  Cellulitis has much improved, redness has resolved, Patient  feels better and want to be discharged.  Patient is being discharged home on doxycycline 100 mg twice a day for 6 more days to complete 10-day treatment.  Advised to follow-up with primary care physician in 1 week.  Assessment and Plan: * Recurrent cellulitis of lower leg- (present on admission) Patient presented with left leg redness, swelling,  warmth and pain for 2 weeks.   He was prescribed Keflex for 10 days. X-ray showed No acute fracture or dislocation,  No focal lytic lesions. Patient has developed 2 small abscesses which were drained in the ED. Continue antibiotics( IV Ancef and vancomycin).  Wound culture : Rare G+ cocci Redness and cellulitis and much improved. Patient is being discharged home on doxycycline for 6 more days to complete 10-day treatment   Stage 3a chronic kidney disease ([place])- (present on admission) Baseline serum creatinine between 1.6-1.7. Serum creatinine remains at baseline.   Avoid nephrotoxic medications.  Primary hypothyroidism- (present on admission) TSH normal, free T4 1.10. Recheck thyroid profile in 4 to 6-weeks.  Essential hypertension, benign- (present on admission) Continue atenolol 25 mg daily. Continue to monitor blood pressure.  Chronic diastolic heart failure ([place])- (present on admission) Not in acute exacerbation, Appears euvolemic. Patient does not take Lasix. Last echocardiogram 8/22 showed LVEF 60 to 65%.  Mixed hyperlipidemia- (present on admission) Continue rosuvastatin.  Coronary atherosclerosis of native coronary artery- (present on admission) Continue cardioprotective medications. Continue aspirin, atenolol, rosuvastatin. Denies any chest pain, shortness of breath, palpitations or dizziness.  DM (diabetes mellitus) (HCC) Continue Lantus 12 units at bedtime,  Continue regular insulin sliding scale,  Pain control - [employer] Controlled Substance Reporting System database was reviewed. and patient was  instructed, not to drive, operate heavy machinery, perform activities at heights, swimming or participation in water activities or provide baby-sitting services while on Pain, Sleep and Anxiety Medications; until their outpatient Physician has advised to do so again. Also recommended to not to take more than prescribed Pain, Sleep and Anxiety Medications.   Consultants: None Procedures performed: Incision and drainage  Disposition: Home Diet recommendation:   Discharge Diet Orders (From admission, onward)     Start     Ordered   08/30/21 0000  Diet - low sodium heart healthy        08/30/21 1008   08/30/21 0000  Diet Carb Modified        08/30/21 1008           Carb modified diet  DISCHARGE MEDICATION: Allergies as of 08/30/2021   No Known Allergies      Medication List     STOP taking these medications    cephALEXin 500 MG capsule Commonly known as: KEFLEX   furosemide 80 MG tablet Commonly known as: LASIX       TAKE these medications    acetaminophen 500 MG tablet Commonly known as: TYLENOL Take 1,000 mg by mouth every 6 (six) hours as needed for mild pain.   Adult One Daily Gummies Chew Chew 1 tablet by mouth daily.   aspirin EC 81 MG tablet Take 1 tablet (81 mg total) by mouth daily.   atenolol 25 MG tablet Commonly known as: TENORMIN Take 1 tablet (25 mg total) by mouth daily.   cyanocobalamin 1000 MCG tablet Take 1,000 mcg by mouth daily.   doxycycline 100 MG EC tablet Commonly known as: DORYX Take 1 tablet (100 mg total) by mouth 2 (two) times daily for 6 days.   FreeStyle [employer] Misc 1 each by Does not apply route daily. What changed:  how much to take how to take this when to take this   insulin aspart 100 UNIT/ML injection Commonly known as: novoLOG Inject 12 Units into the skin daily before lunch. What changed:  when to take this additional instructions   insulin detemir 100 UNIT/ML injection Commonly known as:  LEVEMIR Inject 0.12 mLs (12 Units total) into the skin daily. What changed: when to take this   nitroGLYCERIN 0.4 MG SL tablet Commonly known as: NITROSTAT DISSOLVE 1 TABLET UNDER THE TONGUE EVERY 5 MINUTES AS NEEDED FOR CHEST PAIN What changed:  how much to take how to take this when to take this reasons to take this additional instructions   polyvinyl alcohol 1.4 % ophthalmic solution Commonly known as: LIQUIFILM TEARS Place 1 drop into both eyes 3 (three) times daily as needed for dry eyes.   rosuvastatin 20 MG tablet Commonly known as: CRESTOR Take 1 tablet (20 mg total) by mouth daily.   umeclidinium bromide 62.5 MCG/INH Aepb Commonly known as: INCRUSE ELLIPTA Inhale 1 puff into the lungs daily as needed for wheezing.   Vitamin D3 50 MCG (2000 UT) Tabs Take 2,000 Units by mouth daily.        Follow-up Information     Rankins, Bill Salinas, MD Follow up in 1 week(s).   Specialty: Family Medicine Contact information: [place] [address] [phone]         Jettie Booze, MD .   Specialties: Cardiology, Radiology, Interventional Cardiology Contact information: [address] [facility]  [address] [place] [address] [phone]                 Discharge Exam: Patient was seen and examined at bedside.  Overnight events noted.   Patient reports feeling much improved. Left Leg redness has improved.  He wants to be discharged.  Filed Weights   08/27/21 1300  Weight: 95.3 kg   Physical Exam Constitutional:      Appearance: Normal appearance. He is normal weight.  HENT:     Head: Normocephalic and atraumatic.  Eyes:     Conjunctiva/sclera: Conjunctivae normal.  Cardiovascular:     Rate and Rhythm: Normal rate and regular rhythm.     Pulses: Normal pulses.     Heart sounds: Normal heart sounds.  Pulmonary:     Effort: Pulmonary effort is normal.     Breath sounds: Normal breath sounds.  Abdominal:     General:  Abdomen is flat. Bowel sounds are normal.     Palpations: Abdomen is soft.  Musculoskeletal:     Comments: Left leg redness, erythema, swelling much improved.  Skin:    General: Skin is warm and dry.  Neurological:     General: No focal deficit present.     Mental Status: He is alert and oriented to person, place, and time.     Condition at discharge: stable  The results of significant diagnostics from this hospitalization (including imaging, microbiology, ancillary and laboratory) are listed below for reference.   Imaging Studies: DG Ankle 2 Views Left  Result Date: 08/27/2021 CLINICAL DATA:  Open wound in the skin pain EXAM: LEFT ANKLE - 2 VIEW COMPARISON:  None. FINDINGS: No fracture or dislocation is seen. There are no focal lytic lesions. Osteopenia is seen in bony structures. Sclerotic density in the lateral malleolus may suggest bone island. There is no break in the cortical margins. Arterial calcifications are seen in soft tissues. Plantar spur is seen in calcaneus. There is soft tissue swelling around the ankle. IMPRESSION: No fracture or dislocation is seen. There are no focal lytic lesions. If there is clinical suspicion for osteomyelitis, follow-up MRI may be considered. Electronically Signed   By: Elmer Picker M.D.   On: 08/27/2021 13:53    Microbiology: Results for orders placed or performed during the hospital encounter of 08/27/21  Resp Panel by RT-PCR (Flu A&B, Covid) Nasopharyngeal Swab     Status: None   Collection Time: 08/27/21  3:38 PM   Specimen: Nasopharyngeal Swab; Nasopharyngeal(NP) swabs in vial transport medium  Result Value Ref Range Status   SARS Coronavirus 2 by RT PCR NEGATIVE NEGATIVE Final    Comment: (NOTE) SARS-CoV-2 target nucleic acids are NOT DETECTED.  The SARS-CoV-2 RNA is generally detectable in upper respiratory specimens during the acute phase of infection. The lowest concentration of SARS-CoV-2 viral copies this assay can detect  is 138 copies/mL. A negative result does not preclude SARS-Cov-2 infection and should not be used as the sole basis for treatment or other patient management decisions. A negative result may occur with  improper specimen collection/handling, submission of specimen other than nasopharyngeal swab, presence of viral mutation(s) within the areas targeted by this assay, and inadequate number of viral copies(<138 copies/mL). A negative result must be combined with clinical observations, patient history, and epidemiological information. The expected result is Negative.  Fact Sheet for Patients:  EntrepreneurPulse.com.au  Fact Sheet for Healthcare Providers:  IncredibleEmployment.be  This test is no t yet approved or cleared by the [place] FDA  and  has been authorized for detection and/or diagnosis of SARS-CoV-2 by FDA under an Emergency Use Authorization (EUA). This EUA will remain  in effect (meaning this test can be used) for the duration of the COVID-19 declaration under Section 564(b)(1) of the Act, 21 U.S.C.section 360bbb-3(b)(1), unless the authorization is terminated  or revoked sooner.       Influenza A by PCR NEGATIVE NEGATIVE Final   Influenza B by PCR NEGATIVE NEGATIVE Final    Comment: (NOTE) The Xpert Xpress SARS-CoV-2/FLU/RSV plus assay is intended as an aid in the diagnosis of influenza from Nasopharyngeal swab specimens and should not be used as a sole basis for treatment. Nasal washings and aspirates are unacceptable for Xpert Xpress SARS-CoV-2/FLU/RSV testing.  Fact Sheet for Patients: EntrepreneurPulse.com.au  Fact Sheet for Healthcare Providers: IncredibleEmployment.be  This test is not yet approved or cleared by the [place] FDA and has been authorized for detection and/or diagnosis of SARS-CoV-2 by FDA under an Emergency Use Authorization (EUA). This EUA will remain in effect  (meaning this test can be used) for the duration of the COVID-19 declaration under Section 564(b)(1) of the Act, 21 U.S.C. section 360bbb-3(b)(1), unless the authorization is terminated or revoked.  Performed at [facility], [place] [address] [place], [place] [address]   Aerobic Culture w Gram Stain (superficial specimen)     Status: None (Preliminary result)   Collection Time: 08/27/21  4:08 PM   Specimen: Leg; Wound  Result Value Ref Range Status   Specimen Description   Final    LEG LEFT Performed at [facility] [address], [place], [place] [address]    Special Requests   Final    Normal Performed at [facility], [place] [address] [place], [place] [address]    Gram Stain   Final    FEW WBC PRESENT, PREDOMINANTLY MONONUCLEAR RARE GRAM POSITIVE COCCI    Culture   Final    RARE STAPHYLOCOCCUS AUREUS SUSCEPTIBILITIES TO FOLLOW Performed at [facility], [place] [address], [place], [place] [address]    Report Status PENDING  Incomplete    Labs: CBC: Recent Labs  Lab 08/27/21 1338 08/27/21 1636 08/28/21 1200  WBC 10.0 10.2 11.2*  NEUTROABS 8.8*  --   --   HGB 14.5 14.0 13.6  HCT 44.5 42.6 42.0  MCV 90.1 91.2 92.1  PLT 345 306 767   Basic Metabolic Panel: Recent Labs  Lab 08/27/21 1338 08/27/21 1636 08/28/21 0600 08/30/21 0319  NA 136  --  133* 139  K 4.3  --  4.4 4.0  CL 93*  --  96* 102  CO2 32  --  27 31  GLUCOSE 293*  --  333* 194*  BUN 67*  --  60* 37*  CREATININE 1.64* 1.53* 1.66* 1.15  CALCIUM 9.4  --  8.3* 8.5*  MG  --   --  2.4  --   PHOS  --   --  4.5  --    Liver Function Tests: Recent Labs  Lab 08/28/21 0600  AST 31  ALT 22  ALKPHOS 72  BILITOT 1.1  PROT 6.3*  ALBUMIN 3.1*   CBG: Recent Labs  Lab 08/29/21 1129 08/29/21 1602 08/29/21 2128 08/30/21 0755 08/30/21 0824  GLUCAP 214* 223* 181* 229* 222*    Discharge time spent: greater than 30  minutes.  Signed: Shawna Clamp, MD [facility] 08/30/2021

## 2021-08-30 NOTE — Plan of Care (Signed)
  Problem: Pain Managment: Goal: General experience of comfort will improve Outcome: Progressing   Problem: Safety: Goal: Ability to remain free from injury will improve Outcome: Progressing   

## 2021-08-30 NOTE — Progress Notes (Signed)
Inpatient Diabetes Program Recommendations  AACE/ADA: New Consensus Statement on Inpatient Glycemic Control (2015)  Target Ranges:  Prepandial:   less than 140 mg/dL      Peak postprandial:   less than 180 mg/dL (1-2 hours)      Critically ill patients:  140 - 180 mg/dL   Lab Results  Component Value Date   GLUCAP 222 (H) 08/30/2021   HGBA1C 8.5 (H) 08/27/2021    Review of Glycemic Control  Diabetes history: DM2 Outpatient Diabetes medications: Levemir 12 QAM, Novolog 12 units before breakfast and s/s before lunch and dinner Current orders for Inpatient glycemic control: Levemir 15 units QAM, Novolog 0-9 units TID with meals  HgbA1C - 8.5%  Inpatient Diabetes Program Recommendations:    Spoke with pt about his diabetes and glucose control at home. Pt states he checks blood sugars, "usually 3x/day" and takes Levemir and Novolog as prescribed. Has no issues with hypoglycemia and said he will f/u with his PCP. Seems anxious to be discharged. Has no questions.  Thank you. Lorenda Peck, RD, LDN, CDE Inpatient Diabetes Coordinator [phone]

## 2021-08-30 NOTE — Progress Notes (Signed)
PHARMACY NOTE:  ANTIMICROBIAL RENAL DOSAGE ADJUSTMENT  Current antimicrobial regimen includes a mismatch between antimicrobial dosage and estimated renal function.  As per policy approved by the Pharmacy & Therapeutics and Medical Executive Committees, the antimicrobial dosage will be adjusted accordingly.  Current antimicrobial dosage:  cefazolin 2 g iv q 12 hours  Indication: cellulitis  Renal Function:  Estimated Creatinine Clearance: 54.3 mL/min (by C-G formula based on SCr of 1.15 mg/dL). []      On intermittent HD, scheduled: []      On CRRT    Antimicrobial dosage has been changed to:  cefazolin 2 g iv 8 hours  Additional comments:   Thank you for allowing pharmacy to be a part of this patient's care.  Napoleon Form, [facility] 08/30/2021 9:33 AM

## 2021-10-01 DIAGNOSIS — L03116 Cellulitis of left lower limb: Secondary | ICD-10-CM | POA: Diagnosis not present

## 2021-10-01 DIAGNOSIS — I110 Hypertensive heart disease with heart failure: Secondary | ICD-10-CM | POA: Diagnosis not present

## 2021-10-01 DIAGNOSIS — E785 Hyperlipidemia, unspecified: Secondary | ICD-10-CM | POA: Diagnosis not present

## 2021-10-01 DIAGNOSIS — Z794 Long term (current) use of insulin: Secondary | ICD-10-CM | POA: Diagnosis not present

## 2021-10-01 DIAGNOSIS — I2510 Atherosclerotic heart disease of native coronary artery without angina pectoris: Secondary | ICD-10-CM | POA: Diagnosis not present

## 2021-10-01 DIAGNOSIS — I252 Old myocardial infarction: Secondary | ICD-10-CM | POA: Diagnosis not present

## 2021-10-01 DIAGNOSIS — Z87891 Personal history of nicotine dependence: Secondary | ICD-10-CM | POA: Diagnosis not present

## 2021-10-01 DIAGNOSIS — Q2112 Patent foramen ovale: Secondary | ICD-10-CM | POA: Diagnosis not present

## 2021-10-01 DIAGNOSIS — Z8673 Personal history of transient ischemic attack (TIA), and cerebral infarction without residual deficits: Secondary | ICD-10-CM | POA: Diagnosis not present

## 2021-10-01 DIAGNOSIS — E559 Vitamin D deficiency, unspecified: Secondary | ICD-10-CM | POA: Diagnosis not present

## 2021-10-01 DIAGNOSIS — I509 Heart failure, unspecified: Secondary | ICD-10-CM | POA: Diagnosis not present

## 2021-10-01 DIAGNOSIS — E1140 Type 2 diabetes mellitus with diabetic neuropathy, unspecified: Secondary | ICD-10-CM | POA: Diagnosis not present

## 2021-10-01 DIAGNOSIS — Z9181 History of falling: Secondary | ICD-10-CM | POA: Diagnosis not present

## 2021-10-01 DIAGNOSIS — E039 Hypothyroidism, unspecified: Secondary | ICD-10-CM | POA: Diagnosis not present

## 2021-10-01 DIAGNOSIS — J449 Chronic obstructive pulmonary disease, unspecified: Secondary | ICD-10-CM | POA: Diagnosis not present

## 2021-10-01 DIAGNOSIS — E538 Deficiency of other specified B group vitamins: Secondary | ICD-10-CM | POA: Diagnosis not present

## 2021-10-01 DIAGNOSIS — R413 Other amnesia: Secondary | ICD-10-CM | POA: Diagnosis not present

## 2021-10-01 DIAGNOSIS — R419 Unspecified symptoms and signs involving cognitive functions and awareness: Secondary | ICD-10-CM | POA: Diagnosis not present

## 2021-10-01 DIAGNOSIS — E11319 Type 2 diabetes mellitus with unspecified diabetic retinopathy without macular edema: Secondary | ICD-10-CM | POA: Diagnosis not present

## 2021-10-01 DIAGNOSIS — N1831 Chronic kidney disease, stage 3a: Secondary | ICD-10-CM | POA: Diagnosis not present

## 2021-10-01 DIAGNOSIS — E1122 Type 2 diabetes mellitus with diabetic chronic kidney disease: Secondary | ICD-10-CM | POA: Diagnosis not present

## 2021-10-01 DIAGNOSIS — Z4800 Encounter for change or removal of nonsurgical wound dressing: Secondary | ICD-10-CM | POA: Diagnosis not present

## 2021-10-01 DIAGNOSIS — G4700 Insomnia, unspecified: Secondary | ICD-10-CM | POA: Diagnosis not present

## 2021-10-01 DIAGNOSIS — Z7982 Long term (current) use of aspirin: Secondary | ICD-10-CM | POA: Diagnosis not present

## 2021-10-01 DIAGNOSIS — Z7951 Long term (current) use of inhaled steroids: Secondary | ICD-10-CM | POA: Diagnosis not present

## 2021-10-02 DIAGNOSIS — Z20822 Contact with and (suspected) exposure to covid-19: Secondary | ICD-10-CM | POA: Diagnosis not present

## 2021-10-03 DIAGNOSIS — I110 Hypertensive heart disease with heart failure: Secondary | ICD-10-CM | POA: Diagnosis not present

## 2021-10-03 DIAGNOSIS — N1831 Chronic kidney disease, stage 3a: Secondary | ICD-10-CM | POA: Diagnosis not present

## 2021-10-03 DIAGNOSIS — E1122 Type 2 diabetes mellitus with diabetic chronic kidney disease: Secondary | ICD-10-CM | POA: Diagnosis not present

## 2021-10-03 DIAGNOSIS — I509 Heart failure, unspecified: Secondary | ICD-10-CM | POA: Diagnosis not present

## 2021-10-03 DIAGNOSIS — E11319 Type 2 diabetes mellitus with unspecified diabetic retinopathy without macular edema: Secondary | ICD-10-CM | POA: Diagnosis not present

## 2021-10-03 DIAGNOSIS — E1140 Type 2 diabetes mellitus with diabetic neuropathy, unspecified: Secondary | ICD-10-CM | POA: Diagnosis not present

## 2021-10-04 ENCOUNTER — Telehealth: Payer: Self-pay | Admitting: Interventional Cardiology

## 2021-10-04 NOTE — Telephone Encounter (Signed)
Called and spoke to patient's son (DPR on file). He states that the patient has had some chest discomfort at rest, not pain, and has been fatigued. Denies weight gain. States that he has some SOB with activity. Seen in ED for cellulitis in February, treated with antibiotics. Leg swelling is 75% better since ED visit. Following low salt diet. Son thinks that the patient needs to be on lasix. Looks like this was d/c'd at hospital in February. Son wants patient to keep appointment with Dr. Irish Lack on 3/16 so that he can assess him to determine if he needs to restart his lasix. ?

## 2021-10-04 NOTE — Telephone Encounter (Signed)
Pt c/o medication issue: ? ?1. Name of Medication: Furosemide ? ?2. How are you currently taking this medication (dosage and times per day)?  1 in the morning ? ?3. Are you having a reaction (difficulty breathing--STAT)?  ? ?4. What is your medication issue? Possibly increasing  it  or reenforcement to take it- patient's son wanted him to see Dr Irish Lack , made an appointment for 10-07-21 ? ?

## 2021-10-05 NOTE — Progress Notes (Signed)
?  ?Cardiology Office Note ? ? ?Date:  10/07/2021  ? ?ID:  Luke [name], DOB May 19, 1933, MRN [id number] ? ?PCP:  Aretta Nip, MD  ? ? ?Chief Complaint  ?Patient presents with  ? Follow-up  ? ?CAD ? ?Wt Readings from Last 3 Encounters:  ?10/07/21 223 lb (101.2 kg)  ?08/27/21 210 lb (95.3 kg)  ?04/26/21 210 lb 6.4 oz (95.4 kg)  ?  ? ?  ?History of Present Illness: ?Luke [name] is a 88 y.o. male   with history of CAD (with MI/CABG in 9/99 at [facility], [place] with [place] to LAD, LIMA to OM, SVG to PDA; LAD drug eluting stent in 3/09 through the graft to the distal LAD), DM, stroke, chronic diastolic CHF, CKD stage III, HTN, HLD, hiatal hernia, COPD with hypoxia who presents for f/u of SOB. ?  ?To recap last cardiac studies, last echo 2011 did not report out LVEF. Last stress test 2013 showed mild ischemia in apical lateral region, EF 70%,  ?  ?Saw  Dr. Lamonte Sakai who has titrated his regimen for COPD. Pulmonary function testing confirmed severe obstruction with a positive bronchodilator response. He was started on Stiolto but at f/u with pulm was unsure if this had either helped or if he was even taking correctly. He was willing to try Anoro instead. Dr. Agustina Caroli notes indicated he felt the patient would likely qualify for home O2 but the patient wished to avoid this. ?  ?In September 2022, Had a fall leading to hospitalization due to low blood sugar and Also : "Acute metabolic encephalopathy, suspect mild cognitive impairment which has progressed-there was a component of in-hospital delirium but I do suspect early dementia, mild cognitive impairment.  MRI of the brain was negative for acute intracranial pathology but it did show a remote right parietal lobe infarct as well as parenchymal volume loss and chronic white matter microangiopathy.  If memory issues continue recommend neuropsychiatry testing as an outpatient  ?Acute kidney injury-Baseline creatinine 1.3-1.6, 1.9 on admission, now  normalized." ?  ?Echo in 03/2021 showed: ?"Left ventricular ejection fraction, by estimation, is 65 to 70%. The  ?left ventricle has normal function. The left ventricle has no regional  ?wall motion abnormalities. Indeterminate diastolic filling due to E-A  ?fusion.  ? 2. Right ventricular systolic function is normal. The right ventricular  ?size is normal. Tricuspid regurgitation signal is inadequate for assessing  ?PA pressure.  ? 3. The mitral valve is grossly normal. No evidence of mitral valve  ?regurgitation. No evidence of mitral stenosis.  ? 4. The aortic valve is tricuspid. There is mild calcification of the  ?aortic valve. Aortic valve regurgitation is not visualized. Mild aortic  ?valve sclerosis is present, with no evidence of aortic valve stenosis.  ? 5. The inferior vena cava is normal in size with greater than 50%  ?respiratory variability, suggesting right atrial pressure of 3 mmHg. " ?  ?Felt better with less insulin.   ? ?Had some issues with volume overload. Was admitted with dehydration as noted above. ? ?Worsening shortness of breath with exertion.  He stopped his Lasix of late.  Was in the hospital and Lasix was not a discharge medicine.    ? ?Denies : Chest pain. Dizziness. Nitroglycerin use. Orthopnea. Palpitations. Paroxysmal nocturnal dyspnea. Syncope.   ? ?Past Medical History:  ?Diagnosis Date  ? Anginal pain ([place]) 02/08/12  ? Arthritis   ? "all over"  ? CKD (chronic kidney disease), stage III ([place])   ?  COPD (chronic obstructive pulmonary disease) ([place])   ? Coronary artery disease   ? a. MI/CABG in 9/99 at [facility], [place] with (RIMA to LAD, LIMA to OM, SVG to PDA. b. LAD drug eluting stent in 3/09 through the graft to the distal LAD.  ? Exertional dyspnea   ? "doesn't take much these days"  ? H/O hiatal hernia   ? Hyperlipemia   ? Hypertension   ? Hypothyroidism   ? Hypoxia   ? Stroke [facility]) 09/2010  ? denies residual  ? Type II diabetes mellitus ([place])   ? ? ?Past Surgical  History:  ?Procedure Laterality Date  ? CATARACT EXTRACTION W/ INTRAOCULAR LENS  IMPLANT, BILATERAL  ~ 2007  ? COLONOSCOPY W/ POLYPECTOMY  09/2011  ? "removed 7"  ? CORONARY ANGIOPLASTY WITH STENT PLACEMENT  ~ 2004  ? "1"  ? CORONARY ARTERY BYPASS GRAFT  1999  ? CABG X4  ? EYE SURGERY    ? RETINAL DETACHMENT SURGERY  10/2011  ? right eye  ? ? ? ?Current Outpatient Medications  ?Medication Sig Dispense Refill  ? acetaminophen (TYLENOL) 500 MG tablet Take 1,000 mg by mouth every 6 (six) hours as needed for mild pain.    ? aspirin EC 81 MG tablet Take 1 tablet (81 mg total) by mouth daily.    ? Cholecalciferol (VITAMIN D3) 50 MCG (2000 UT) TABS Take 2,000 Units by mouth daily.    ? Continuous Blood Gluc Sensor ([place]) MISC 1 each by Does not apply route daily. (Patient taking differently: Inject 1 Device into the skin every 14 (fourteen) days.) 1 each 0  ? cyanocobalamin 1000 MCG tablet Take 1,000 mcg by mouth daily.    ? Glucosamine-Chondroitin-MSM 500-200-150 MG TABS Take 1 tablet by mouth daily.    ? insulin aspart (NOVOLOG) 100 UNIT/ML injection Inject 12 Units into the skin daily before lunch. (Patient taking differently: Inject 12 Units into the skin See admin instructions. Inject 12 units into the skin before breakfast and PER SLIDING SCALE before lunch and supper/evening meal) 10 mL 0  ? insulin detemir (LEVEMIR) 100 UNIT/ML injection Inject 0.12 mLs (12 Units total) into the skin daily. (Patient taking differently: Inject 12 Units into the skin daily before breakfast.) 10 mL 0  ? Multiple Vitamins-Minerals (ADULT ONE DAILY GUMMIES) CHEW Chew 1 tablet by mouth daily.    ? nitroGLYCERIN (NITROSTAT) 0.4 MG SL tablet DISSOLVE 1 TABLET UNDER THE TONGUE EVERY 5 MINUTES AS NEEDED FOR CHEST PAIN (Patient taking differently: Place 0.4 mg under the tongue every 5 (five) minutes as needed for chest pain.) 25 tablet 6  ? polyvinyl alcohol (LIQUIFILM TEARS) 1.4 % ophthalmic solution Place 1 drop into  both eyes 3 (three) times daily as needed for dry eyes.    ? rosuvastatin (CRESTOR) 20 MG tablet Take 1 tablet (20 mg total) by mouth daily. 90 tablet 3  ? umeclidinium bromide (INCRUSE ELLIPTA) 62.5 MCG/INH AEPB Inhale 1 puff into the lungs daily as needed for wheezing.    ? ?No current facility-administered medications for this visit.  ? ? ?Allergies:   Patient has no known allergies.  ? ? ?Social History:  The patient  reports that he quit smoking about 28 years ago. His smoking use included cigarettes. He has a 70.00 pack-year smoking history. He quit smokeless tobacco use about 26 years ago. He reports that he does not drink alcohol and does not use drugs.  ? ?Family History:  The patient's  family history includes Heart attack in his mother.  ? ? ?ROS:  Please see the history of present illness.   Otherwise, review of systems are positive for shortness of breath.   All other systems are reviewed and negative.  ? ? ?PHYSICAL EXAM: ?VS:  BP 130/70   Pulse 86   Ht '5\' 11"'$  (1.803 m)   Wt 223 lb (101.2 kg)   SpO2 93%   BMI 31.10 kg/m?  , BMI Body mass index is 31.1 kg/m?. ?GEN: Well nourished, well developed, in no acute distress ?HEENT: normal ?Neck: no JVD, carotid bruits, or masses ?Cardiac: RRR; no murmurs, rubs, or gallops,; severe bilateral lower extremity pitting edema  ?Respiratory:  clear to auscultation bilaterally, normal work of breathing ?GI: soft, nontender, nondistended, + BS ?MS: no deformity or atrophy ?Skin: warm and dry, no rash ?Neuro:  Strength and sensation are intact ?Psych: euthymic mood, full affect ? ? ?EKG:   ?The ekg ordered today demonstrates NSR, LVH, nonspecific ST changes ? ? ?Recent Labs: ?04/10/2021: B Natriuretic Peptide 159.4 ?08/27/2021: TSH 0.965 ?08/28/2021: ALT 22; Hemoglobin 13.6; Magnesium 2.4; Platelets 312 ?08/30/2021: BUN 37; Creatinine, Ser 1.15; Potassium 4.0; Sodium 139  ? ?Lipid Panel ?No results found for: CHOL, TRIG, HDL, CHOLHDL, VLDL, LDLCALC, LDLDIRECT ?  ?Other  studies Reviewed: ?Additional studies/ records that were reviewed today with results demonstrating: Labs reviewed. ? ? ?ASSESSMENT AND PLAN: ? ?CAD: No clear angina. Continue aggressive secondary prevention.  He has

## 2021-10-06 ENCOUNTER — Telehealth: Payer: Self-pay | Admitting: Interventional Cardiology

## 2021-10-06 DIAGNOSIS — E1122 Type 2 diabetes mellitus with diabetic chronic kidney disease: Secondary | ICD-10-CM | POA: Diagnosis not present

## 2021-10-06 DIAGNOSIS — N1831 Chronic kidney disease, stage 3a: Secondary | ICD-10-CM | POA: Diagnosis not present

## 2021-10-06 DIAGNOSIS — E11319 Type 2 diabetes mellitus with unspecified diabetic retinopathy without macular edema: Secondary | ICD-10-CM | POA: Diagnosis not present

## 2021-10-06 DIAGNOSIS — E1140 Type 2 diabetes mellitus with diabetic neuropathy, unspecified: Secondary | ICD-10-CM | POA: Diagnosis not present

## 2021-10-06 DIAGNOSIS — I110 Hypertensive heart disease with heart failure: Secondary | ICD-10-CM | POA: Diagnosis not present

## 2021-10-06 DIAGNOSIS — I509 Heart failure, unspecified: Secondary | ICD-10-CM | POA: Diagnosis not present

## 2021-10-06 NOTE — Telephone Encounter (Signed)
Left message to call office.  Patient has appointment with Dr Irish Lack tomorrow.  ?

## 2021-10-06 NOTE — Telephone Encounter (Signed)
Pt c/o Shortness Of Breath: STAT if SOB developed within the last 24 hours or pt is noticeably SOB on the phone ? ?1. Are you currently SOB (can you hear that pt is SOB on the phone)? Home Health PT is not with the patient now ? ?2. How long have you been experiencing SOB? A while ? ?3. Are you SOB when sitting or when up moving around? When up and moving around ? ?4. Are you currently experiencing any other symptoms? Oxygen stats were in the mid 80s during activity and dropped to the mid 70s at one point. The patient did not report any other symptoms  ?

## 2021-10-07 ENCOUNTER — Ambulatory Visit (INDEPENDENT_AMBULATORY_CARE_PROVIDER_SITE_OTHER): Payer: Medicare Other | Admitting: Interventional Cardiology

## 2021-10-07 ENCOUNTER — Encounter: Payer: Self-pay | Admitting: Interventional Cardiology

## 2021-10-07 ENCOUNTER — Other Ambulatory Visit: Payer: Self-pay

## 2021-10-07 ENCOUNTER — Encounter (INDEPENDENT_AMBULATORY_CARE_PROVIDER_SITE_OTHER): Payer: Self-pay

## 2021-10-07 VITALS — BP 130/70 | HR 86 | Ht 71.0 in | Wt 223.0 lb

## 2021-10-07 DIAGNOSIS — I25118 Atherosclerotic heart disease of native coronary artery with other forms of angina pectoris: Secondary | ICD-10-CM | POA: Diagnosis not present

## 2021-10-07 DIAGNOSIS — I5032 Chronic diastolic (congestive) heart failure: Secondary | ICD-10-CM | POA: Diagnosis not present

## 2021-10-07 DIAGNOSIS — I10 Essential (primary) hypertension: Secondary | ICD-10-CM

## 2021-10-07 DIAGNOSIS — E782 Mixed hyperlipidemia: Secondary | ICD-10-CM | POA: Diagnosis not present

## 2021-10-07 MED ORDER — FUROSEMIDE 80 MG PO TABS: 80.0000 mg | ORAL_TABLET | Freq: Every day | ORAL | 3 refills | Status: DC

## 2021-10-07 NOTE — Telephone Encounter (Signed)
Patient saw Dr Irish Lack today ?

## 2021-10-07 NOTE — Patient Instructions (Addendum)
Medication Instructions:  ?Your physician has recommended you make the following change in your medication: Resume furosemide 80 mg by mouth daily ? ?*If you need a refill on your cardiac medications before your next appointment, please call your pharmacy* ? ? ?Lab Work: ?Have lab work checked by home health in 1-2 weeks.  BMP and BNP.  If home health cannot do let us know and we will schedule appointment to have done in the office ?If you have labs (blood work) drawn today and your tests are completely normal, you will receive your results only by: ?MyChart Message (if you have MyChart) OR ?A paper copy in the mail ?If you have any lab test that is abnormal or we need to change your treatment, we will call you to review the results. ? ? ?Testing/Procedures: ?none ? ? ?Follow-Up: ?At [facility], you and your health needs are our priority.  As part of our continuing mission to provide you with exceptional heart care, we have created designated Provider Care Teams.  These Care Teams include your primary Cardiologist (physician) and Advanced Practice Providers (APPs -  Physician Assistants and Nurse Practitioners) who all work together to provide you with the care you need, when you need it. ? ?We recommend signing up for the patient portal called "MyChart".  Sign up information is provided on this After Visit Summary.  MyChart is used to connect with patients for Virtual Visits (Telemedicine).  Patients are able to view lab/test results, encounter notes, upcoming appointments, etc.  Non-urgent messages can be sent to your provider as well.   ?To learn more about what you can do with MyChart, go to [email or web address].   ? ?Your next appointment:   ?April 22, 2022 at 2:00 ? ?The format for your next appointment:   ?In Person ? ?Provider:   ?Larae Grooms, MD   ? ? ?Other Instructions ?  ? ?

## 2021-10-08 DIAGNOSIS — E1140 Type 2 diabetes mellitus with diabetic neuropathy, unspecified: Secondary | ICD-10-CM | POA: Diagnosis not present

## 2021-10-08 DIAGNOSIS — I110 Hypertensive heart disease with heart failure: Secondary | ICD-10-CM | POA: Diagnosis not present

## 2021-10-08 DIAGNOSIS — I509 Heart failure, unspecified: Secondary | ICD-10-CM | POA: Diagnosis not present

## 2021-10-08 DIAGNOSIS — N1831 Chronic kidney disease, stage 3a: Secondary | ICD-10-CM | POA: Diagnosis not present

## 2021-10-08 DIAGNOSIS — E11319 Type 2 diabetes mellitus with unspecified diabetic retinopathy without macular edema: Secondary | ICD-10-CM | POA: Diagnosis not present

## 2021-10-08 DIAGNOSIS — E1122 Type 2 diabetes mellitus with diabetic chronic kidney disease: Secondary | ICD-10-CM | POA: Diagnosis not present

## 2021-10-09 ENCOUNTER — Emergency Department (HOSPITAL_COMMUNITY): Payer: Medicare Other

## 2021-10-09 ENCOUNTER — Other Ambulatory Visit: Payer: Self-pay

## 2021-10-09 ENCOUNTER — Inpatient Hospital Stay (HOSPITAL_COMMUNITY)
Admission: EM | Admit: 2021-10-09 | Discharge: 2021-10-23 | DRG: 291 | Disposition: E | Payer: Medicare Other | Attending: Internal Medicine | Admitting: Internal Medicine

## 2021-10-09 DIAGNOSIS — Z66 Do not resuscitate: Secondary | ICD-10-CM | POA: Diagnosis present

## 2021-10-09 DIAGNOSIS — N183 Chronic kidney disease, stage 3 unspecified: Secondary | ICD-10-CM

## 2021-10-09 DIAGNOSIS — E876 Hypokalemia: Secondary | ICD-10-CM | POA: Diagnosis not present

## 2021-10-09 DIAGNOSIS — I5043 Acute on chronic combined systolic (congestive) and diastolic (congestive) heart failure: Secondary | ICD-10-CM | POA: Diagnosis not present

## 2021-10-09 DIAGNOSIS — N179 Acute kidney failure, unspecified: Secondary | ICD-10-CM | POA: Diagnosis not present

## 2021-10-09 DIAGNOSIS — Z20822 Contact with and (suspected) exposure to covid-19: Secondary | ICD-10-CM | POA: Diagnosis present

## 2021-10-09 DIAGNOSIS — Z515 Encounter for palliative care: Secondary | ICD-10-CM

## 2021-10-09 DIAGNOSIS — E871 Hypo-osmolality and hyponatremia: Secondary | ICD-10-CM | POA: Diagnosis not present

## 2021-10-09 DIAGNOSIS — J189 Pneumonia, unspecified organism: Secondary | ICD-10-CM

## 2021-10-09 DIAGNOSIS — I429 Cardiomyopathy, unspecified: Secondary | ICD-10-CM | POA: Diagnosis not present

## 2021-10-09 DIAGNOSIS — Z794 Long term (current) use of insulin: Secondary | ICD-10-CM | POA: Diagnosis not present

## 2021-10-09 DIAGNOSIS — S0101XA Laceration without foreign body of scalp, initial encounter: Secondary | ICD-10-CM | POA: Diagnosis present

## 2021-10-09 DIAGNOSIS — D631 Anemia in chronic kidney disease: Secondary | ICD-10-CM | POA: Diagnosis present

## 2021-10-09 DIAGNOSIS — W06XXXA Fall from bed, initial encounter: Secondary | ICD-10-CM | POA: Diagnosis present

## 2021-10-09 DIAGNOSIS — E1122 Type 2 diabetes mellitus with diabetic chronic kidney disease: Secondary | ICD-10-CM | POA: Diagnosis present

## 2021-10-09 DIAGNOSIS — F05 Delirium due to known physiological condition: Secondary | ICD-10-CM | POA: Diagnosis not present

## 2021-10-09 DIAGNOSIS — G319 Degenerative disease of nervous system, unspecified: Secondary | ICD-10-CM | POA: Diagnosis not present

## 2021-10-09 DIAGNOSIS — R0602 Shortness of breath: Secondary | ICD-10-CM | POA: Diagnosis not present

## 2021-10-09 DIAGNOSIS — Z7982 Long term (current) use of aspirin: Secondary | ICD-10-CM

## 2021-10-09 DIAGNOSIS — Z751 Person awaiting admission to adequate facility elsewhere: Secondary | ICD-10-CM

## 2021-10-09 DIAGNOSIS — N170 Acute kidney failure with tubular necrosis: Secondary | ICD-10-CM | POA: Diagnosis not present

## 2021-10-09 DIAGNOSIS — I130 Hypertensive heart and chronic kidney disease with heart failure and stage 1 through stage 4 chronic kidney disease, or unspecified chronic kidney disease: Principal | ICD-10-CM | POA: Diagnosis present

## 2021-10-09 DIAGNOSIS — I447 Left bundle-branch block, unspecified: Secondary | ICD-10-CM | POA: Diagnosis present

## 2021-10-09 DIAGNOSIS — J449 Chronic obstructive pulmonary disease, unspecified: Secondary | ICD-10-CM | POA: Diagnosis present

## 2021-10-09 DIAGNOSIS — I5021 Acute systolic (congestive) heart failure: Secondary | ICD-10-CM | POA: Diagnosis not present

## 2021-10-09 DIAGNOSIS — Z79899 Other long term (current) drug therapy: Secondary | ICD-10-CM

## 2021-10-09 DIAGNOSIS — I509 Heart failure, unspecified: Secondary | ICD-10-CM

## 2021-10-09 DIAGNOSIS — I2510 Atherosclerotic heart disease of native coronary artery without angina pectoris: Secondary | ICD-10-CM | POA: Diagnosis present

## 2021-10-09 DIAGNOSIS — T501X5A Adverse effect of loop [high-ceiling] diuretics, initial encounter: Secondary | ICD-10-CM | POA: Diagnosis not present

## 2021-10-09 DIAGNOSIS — R918 Other nonspecific abnormal finding of lung field: Secondary | ICD-10-CM | POA: Diagnosis not present

## 2021-10-09 DIAGNOSIS — R339 Retention of urine, unspecified: Secondary | ICD-10-CM | POA: Diagnosis not present

## 2021-10-09 DIAGNOSIS — G9341 Metabolic encephalopathy: Secondary | ICD-10-CM

## 2021-10-09 DIAGNOSIS — I5031 Acute diastolic (congestive) heart failure: Secondary | ICD-10-CM | POA: Diagnosis not present

## 2021-10-09 DIAGNOSIS — J9602 Acute respiratory failure with hypercapnia: Secondary | ICD-10-CM | POA: Diagnosis present

## 2021-10-09 DIAGNOSIS — I6523 Occlusion and stenosis of bilateral carotid arteries: Secondary | ICD-10-CM | POA: Diagnosis not present

## 2021-10-09 DIAGNOSIS — I959 Hypotension, unspecified: Secondary | ICD-10-CM | POA: Diagnosis not present

## 2021-10-09 DIAGNOSIS — J984 Other disorders of lung: Secondary | ICD-10-CM | POA: Diagnosis not present

## 2021-10-09 DIAGNOSIS — Y92013 Bedroom of single-family (private) house as the place of occurrence of the external cause: Secondary | ICD-10-CM | POA: Diagnosis not present

## 2021-10-09 DIAGNOSIS — E873 Alkalosis: Secondary | ICD-10-CM | POA: Diagnosis present

## 2021-10-09 DIAGNOSIS — Z951 Presence of aortocoronary bypass graft: Secondary | ICD-10-CM

## 2021-10-09 DIAGNOSIS — E669 Obesity, unspecified: Secondary | ICD-10-CM | POA: Diagnosis present

## 2021-10-09 DIAGNOSIS — E870 Hyperosmolality and hypernatremia: Secondary | ICD-10-CM | POA: Diagnosis not present

## 2021-10-09 DIAGNOSIS — N1832 Chronic kidney disease, stage 3b: Secondary | ICD-10-CM | POA: Diagnosis present

## 2021-10-09 DIAGNOSIS — M47812 Spondylosis without myelopathy or radiculopathy, cervical region: Secondary | ICD-10-CM | POA: Diagnosis not present

## 2021-10-09 DIAGNOSIS — D61818 Other pancytopenia: Secondary | ICD-10-CM

## 2021-10-09 DIAGNOSIS — R4182 Altered mental status, unspecified: Secondary | ICD-10-CM | POA: Diagnosis not present

## 2021-10-09 DIAGNOSIS — R410 Disorientation, unspecified: Secondary | ICD-10-CM | POA: Diagnosis not present

## 2021-10-09 DIAGNOSIS — I5033 Acute on chronic diastolic (congestive) heart failure: Secondary | ICD-10-CM | POA: Diagnosis not present

## 2021-10-09 DIAGNOSIS — R55 Syncope and collapse: Principal | ICD-10-CM

## 2021-10-09 DIAGNOSIS — W19XXXA Unspecified fall, initial encounter: Secondary | ICD-10-CM | POA: Diagnosis not present

## 2021-10-09 DIAGNOSIS — E039 Hypothyroidism, unspecified: Secondary | ICD-10-CM | POA: Diagnosis present

## 2021-10-09 DIAGNOSIS — I10 Essential (primary) hypertension: Secondary | ICD-10-CM | POA: Diagnosis not present

## 2021-10-09 DIAGNOSIS — J90 Pleural effusion, not elsewhere classified: Secondary | ICD-10-CM | POA: Diagnosis not present

## 2021-10-09 DIAGNOSIS — R0902 Hypoxemia: Secondary | ICD-10-CM

## 2021-10-09 DIAGNOSIS — Z955 Presence of coronary angioplasty implant and graft: Secondary | ICD-10-CM

## 2021-10-09 DIAGNOSIS — Z8673 Personal history of transient ischemic attack (TIA), and cerebral infarction without residual deficits: Secondary | ICD-10-CM

## 2021-10-09 DIAGNOSIS — R54 Age-related physical debility: Secondary | ICD-10-CM | POA: Diagnosis present

## 2021-10-09 DIAGNOSIS — J188 Other pneumonia, unspecified organism: Secondary | ICD-10-CM

## 2021-10-09 DIAGNOSIS — E1165 Type 2 diabetes mellitus with hyperglycemia: Secondary | ICD-10-CM

## 2021-10-09 DIAGNOSIS — R609 Edema, unspecified: Secondary | ICD-10-CM | POA: Diagnosis not present

## 2021-10-09 DIAGNOSIS — S199XXA Unspecified injury of neck, initial encounter: Secondary | ICD-10-CM | POA: Diagnosis not present

## 2021-10-09 DIAGNOSIS — E559 Vitamin D deficiency, unspecified: Secondary | ICD-10-CM | POA: Diagnosis present

## 2021-10-09 DIAGNOSIS — Z87891 Personal history of nicotine dependence: Secondary | ICD-10-CM

## 2021-10-09 DIAGNOSIS — M159 Polyosteoarthritis, unspecified: Secondary | ICD-10-CM | POA: Diagnosis present

## 2021-10-09 DIAGNOSIS — Z9114 Patient's other noncompliance with medication regimen: Secondary | ICD-10-CM

## 2021-10-09 DIAGNOSIS — J9601 Acute respiratory failure with hypoxia: Secondary | ICD-10-CM

## 2021-10-09 DIAGNOSIS — Z6829 Body mass index (BMI) 29.0-29.9, adult: Secondary | ICD-10-CM

## 2021-10-09 DIAGNOSIS — J690 Pneumonitis due to inhalation of food and vomit: Secondary | ICD-10-CM | POA: Diagnosis not present

## 2021-10-09 DIAGNOSIS — Z7189 Other specified counseling: Secondary | ICD-10-CM | POA: Diagnosis not present

## 2021-10-09 DIAGNOSIS — I493 Ventricular premature depolarization: Secondary | ICD-10-CM | POA: Diagnosis not present

## 2021-10-09 DIAGNOSIS — S0990XA Unspecified injury of head, initial encounter: Secondary | ICD-10-CM | POA: Diagnosis not present

## 2021-10-09 DIAGNOSIS — I517 Cardiomegaly: Secondary | ICD-10-CM | POA: Diagnosis not present

## 2021-10-09 DIAGNOSIS — Z8249 Family history of ischemic heart disease and other diseases of the circulatory system: Secondary | ICD-10-CM

## 2021-10-09 DIAGNOSIS — E782 Mixed hyperlipidemia: Secondary | ICD-10-CM | POA: Diagnosis present

## 2021-10-09 DIAGNOSIS — G4733 Obstructive sleep apnea (adult) (pediatric): Secondary | ICD-10-CM | POA: Diagnosis present

## 2021-10-09 DIAGNOSIS — E785 Hyperlipidemia, unspecified: Secondary | ICD-10-CM | POA: Diagnosis present

## 2021-10-09 DIAGNOSIS — I700 Atherosclerosis of aorta: Secondary | ICD-10-CM | POA: Diagnosis not present

## 2021-10-09 LAB — CBC WITH DIFFERENTIAL/PLATELET
Abs Immature Granulocytes: 0.01 10*3/uL (ref 0.00–0.07)
Basophils Absolute: 0 10*3/uL (ref 0.0–0.1)
Basophils Relative: 0 %
Eosinophils Absolute: 0.1 10*3/uL (ref 0.0–0.5)
Eosinophils Relative: 2 %
HCT: 42.3 % (ref 39.0–52.0)
Hemoglobin: 13.4 g/dL (ref 13.0–17.0)
Immature Granulocytes: 0 %
Lymphocytes Relative: 13 %
Lymphs Abs: 0.7 10*3/uL (ref 0.7–4.0)
MCH: 30.5 pg (ref 26.0–34.0)
MCHC: 31.7 g/dL (ref 30.0–36.0)
MCV: 96.4 fL (ref 80.0–100.0)
Monocytes Absolute: 0.6 10*3/uL (ref 0.1–1.0)
Monocytes Relative: 11 %
Neutro Abs: 4.1 10*3/uL (ref 1.7–7.7)
Neutrophils Relative %: 74 %
Platelets: 131 10*3/uL — ABNORMAL LOW (ref 150–400)
RBC: 4.39 MIL/uL (ref 4.22–5.81)
RDW: 14 % (ref 11.5–15.5)
WBC: 5.5 10*3/uL (ref 4.0–10.5)
nRBC: 0 % (ref 0.0–0.2)

## 2021-10-09 LAB — BLOOD GAS, ARTERIAL
Acid-Base Excess: 12.5 mmol/L — ABNORMAL HIGH (ref 0.0–2.0)
Bicarbonate: 40.8 mmol/L — ABNORMAL HIGH (ref 20.0–28.0)
Expiratory PAP: 6 cmH2O
FIO2: 30 %
Inspiratory PAP: 16 cmH2O
O2 Saturation: 97.7 %
Patient temperature: 37
RATE: 16 resp/min
pCO2 arterial: 69 mmHg (ref 32–48)
pH, Arterial: 7.38 (ref 7.35–7.45)
pO2, Arterial: 81 mmHg — ABNORMAL LOW (ref 83–108)

## 2021-10-09 LAB — COMPREHENSIVE METABOLIC PANEL
ALT: 14 U/L (ref 0–44)
AST: 18 U/L (ref 15–41)
Albumin: 3.8 g/dL (ref 3.5–5.0)
Alkaline Phosphatase: 43 U/L (ref 38–126)
Anion gap: 4 — ABNORMAL LOW (ref 5–15)
BUN: 26 mg/dL — ABNORMAL HIGH (ref 8–23)
CO2: 35 mmol/L — ABNORMAL HIGH (ref 22–32)
Calcium: 8.3 mg/dL — ABNORMAL LOW (ref 8.9–10.3)
Chloride: 99 mmol/L (ref 98–111)
Creatinine, Ser: 1.05 mg/dL (ref 0.61–1.24)
GFR, Estimated: >60 mL/min (ref >60)
Glucose, Bld: 182 mg/dL — ABNORMAL HIGH (ref 70–99)
Potassium: 4.2 mmol/L (ref 3.5–5.1)
Sodium: 138 mmol/L (ref 135–145)
Total Bilirubin: 0.7 mg/dL (ref 0.3–1.2)
Total Protein: 6.3 g/dL — ABNORMAL LOW (ref 6.5–8.1)

## 2021-10-09 LAB — RESP PANEL BY RT-PCR (FLU A&B, COVID) ARPGX2
Influenza A by PCR: NEGATIVE
Influenza B by PCR: NEGATIVE
SARS Coronavirus 2 by RT PCR: NEGATIVE

## 2021-10-09 LAB — GLUCOSE, CAPILLARY
Glucose-Capillary: 163 mg/dL — ABNORMAL HIGH (ref 70–99)
Glucose-Capillary: 151 mg/dL — ABNORMAL HIGH (ref 70–99)

## 2021-10-09 LAB — TROPONIN I (HIGH SENSITIVITY)
Troponin I (High Sensitivity): 32 ng/L — ABNORMAL HIGH (ref <18)
Troponin I (High Sensitivity): 33 ng/L — ABNORMAL HIGH (ref <18)

## 2021-10-09 LAB — BRAIN NATRIURETIC PEPTIDE: B Natriuretic Peptide: 1161.9 pg/mL — ABNORMAL HIGH (ref 0.0–100.0)

## 2021-10-09 MED ORDER — INSULIN DETEMIR 100 UNIT/ML ~~LOC~~ SOLN
12.0000 [IU] | Freq: Every day | SUBCUTANEOUS | Status: DC
  Administered 2021-10-10 – 2021-10-11 (×2): 12 [IU] via SUBCUTANEOUS
  Filled 2021-10-09 (×3): qty 0.12

## 2021-10-09 MED ORDER — ASPIRIN EC 81 MG PO TBEC
81.0000 mg | DELAYED_RELEASE_TABLET | Freq: Every day | ORAL | Status: DC
  Administered 2021-10-10 – 2021-10-18 (×8): 81 mg via ORAL
  Filled 2021-10-09 (×9): qty 1

## 2021-10-09 MED ORDER — IOHEXOL 350 MG/ML SOLN
75.0000 mL | Freq: Once | INTRAVENOUS | Status: AC | PRN
  Administered 2021-10-09: 75 mL via INTRAVENOUS

## 2021-10-09 MED ORDER — NITROGLYCERIN 2 % TD OINT
0.5000 [in_us] | TOPICAL_OINTMENT | Freq: Four times a day (QID) | TRANSDERMAL | Status: AC
  Administered 2021-10-09 (×2): 0.5 [in_us] via TOPICAL
  Filled 2021-10-09: qty 1
  Filled 2021-10-09: qty 30

## 2021-10-09 MED ORDER — ORAL CARE MOUTH RINSE
15.0000 mL | Freq: Two times a day (BID) | OROMUCOSAL | Status: DC
  Administered 2021-10-09 – 2021-10-20 (×22): 15 mL via OROMUCOSAL

## 2021-10-09 MED ORDER — METOPROLOL SUCCINATE ER 25 MG PO TB24
12.5000 mg | ORAL_TABLET | Freq: Every day | ORAL | Status: DC
  Administered 2021-10-10 – 2021-10-12 (×3): 12.5 mg via ORAL
  Filled 2021-10-09 (×3): qty 1

## 2021-10-09 MED ORDER — ALBUTEROL SULFATE (2.5 MG/3ML) 0.083% IN NEBU
3.0000 mL | INHALATION_SOLUTION | Freq: Four times a day (QID) | RESPIRATORY_TRACT | Status: DC | PRN
  Administered 2021-10-10: 3 mL via RESPIRATORY_TRACT
  Filled 2021-10-09: qty 3

## 2021-10-09 MED ORDER — INSULIN ASPART 100 UNIT/ML IJ SOLN
0.0000 [IU] | INTRAMUSCULAR | Status: DC
  Administered 2021-10-10 (×2): 1 [IU] via SUBCUTANEOUS

## 2021-10-09 MED ORDER — IPRATROPIUM BROMIDE 0.02 % IN SOLN
0.5000 mg | Freq: Four times a day (QID) | RESPIRATORY_TRACT | Status: DC
  Administered 2021-10-10 (×2): 0.5 mg via RESPIRATORY_TRACT
  Filled 2021-10-09 (×2): qty 2.5

## 2021-10-09 MED ORDER — ENOXAPARIN SODIUM 40 MG/0.4ML IJ SOSY
40.0000 mg | PREFILLED_SYRINGE | INTRAMUSCULAR | Status: DC
  Administered 2021-10-09 – 2021-10-12 (×4): 40 mg via SUBCUTANEOUS
  Filled 2021-10-09 (×4): qty 0.4

## 2021-10-09 MED ORDER — FUROSEMIDE 10 MG/ML IJ SOLN
60.0000 mg | Freq: Two times a day (BID) | INTRAMUSCULAR | Status: DC
  Administered 2021-10-10 – 2021-10-11 (×3): 60 mg via INTRAVENOUS
  Filled 2021-10-09 (×3): qty 6

## 2021-10-09 MED ORDER — NITROGLYCERIN 0.4 MG SL SUBL: 0.4000 mg | SUBLINGUAL_TABLET | SUBLINGUAL | Status: DC | PRN

## 2021-10-09 MED ORDER — FUROSEMIDE 10 MG/ML IJ SOLN
80.0000 mg | Freq: Once | INTRAMUSCULAR | Status: AC
Start: 2021-10-09 — End: 2021-10-09
  Administered 2021-10-09: 80 mg via INTRAVENOUS
  Filled 2021-10-09: qty 8

## 2021-10-09 MED ORDER — FUROSEMIDE 10 MG/ML IJ SOLN
40.0000 mg | Freq: Once | INTRAMUSCULAR | Status: AC
Start: 2021-10-09 — End: 2021-10-09
  Administered 2021-10-09: 40 mg via INTRAVENOUS
  Filled 2021-10-09: qty 4

## 2021-10-09 MED ORDER — CHLORHEXIDINE GLUCONATE CLOTH 2 % EX PADS
6.0000 | MEDICATED_PAD | Freq: Every day | CUTANEOUS | Status: DC
  Administered 2021-10-09 – 2021-10-19 (×12): 6 via TOPICAL

## 2021-10-09 MED ORDER — UMECLIDINIUM-VILANTEROL 62.5-25 MCG/ACT IN AEPB
1.0000 | INHALATION_SPRAY | Freq: Every day | RESPIRATORY_TRACT | Status: DC
  Administered 2021-10-10 – 2021-10-17 (×6): 1 via RESPIRATORY_TRACT
  Filled 2021-10-09: qty 14

## 2021-10-09 NOTE — ED Triage Notes (Signed)
Pt BIBA from home. Pt suffered fall from bed last night, no LOC, skin tear on scalp and R arm. No blood thinners. ? ?Pt has [facility] visit them 2x weekly. Pt c/o SOB. Pt SPO2 has been trending lower. Pt 88% on RA upon EMS arrival. 96% on 2L [place]. ? ?Pt noncompliant w/Lasix d/t c/o too frequent urination. ? ?BP: 178/98 ?HR: 84 ?SPO2: 96 2L [place] ?CBG: 234 ?

## 2021-10-09 NOTE — ED Provider Notes (Signed)
?[facility] DEPT ?Provider Note ? ? ?CSN: [id number] ?Arrival date & time: 10/04/2021  1139 ? ?  ? ?History ? ?Chief Complaint  ?Patient presents with  ? Shortness of Breath  ? ? ?Luke [name] is a 88 y.o. male with a past medical history significant for diabetes, coronary artery disease with previous bypass, COPD, no current tobacco use, diastolic heart failure with noncompliance of furosemide who presents from home with fall from bed last night.  Patient believes that he may have had a loss of consciousness.  Initially he had denied loss of consciousness with EMS.  He does not take any blood thinners.  Is some skin tears, injuries on the scalp and right arm.  Home health visits patient twice weekly.  He does have a history of decreased oxygen saturations, has been in talks to receive 2 L nasal cannula at baseline.  He has had worsening leg swelling due to Lasix noncompliance.  He was 88% on room air on EMS arrival, 96% with 2 L nasal cannula.  He has not complained of any difficulty with speech, confusion, facial droop, unilateral weakness.  He denies any dizziness, nausea, vomiting, fever, chills.  He denies any dysuria, abdominal pain. ? ? ?Shortness of Breath ? ?  ? ?Home Medications ?Prior to Admission medications   ?Medication Sig Start Date End Date Taking? Authorizing Provider  ?acetaminophen (TYLENOL) 500 MG tablet Take 500 mg by mouth daily.   Yes [provider]  ?albuterol (VENTOLIN HFA) 108 (90 Base) MCG/ACT inhaler Inhale 2 puffs into the lungs every 6 (six) hours as needed for wheezing or shortness of breath.   Yes [provider]  ?aspirin EC 81 MG tablet Take 1 tablet (81 mg total) by mouth daily. 10/21/15  Yes Jettie Booze, MD  ?Cholecalciferol (VITAMIN D3) 50 MCG (2000 UT) TABS Take 2,000 Units by mouth daily.   Yes [provider]  ?cyanocobalamin 1000 MCG tablet Take 1,000 mcg by mouth daily.   Yes [provider]   ?furosemide (LASIX) 80 MG tablet Take 1 tablet (80 mg total) by mouth daily. 10/07/21  Yes Jettie Booze, MD  ?Glucosamine-Chondroitin-MSM 500-200-150 MG TABS Take 1 tablet by mouth daily.   Yes [provider]  ?insulin aspart (NOVOLOG) 100 UNIT/ML injection Inject 12 Units into the skin daily before lunch. ?Patient taking differently: Inject 6-12 Units into the skin See admin instructions. Inject 6-12 units into the skin before breakfast and PER SLIDING SCALE before lunch and supper/evening meal 04/12/21  Yes Gherghe, Vella Redhead, MD  ?insulin detemir (LEVEMIR) 100 UNIT/ML injection Inject 0.12 mLs (12 Units total) into the skin daily. ?Patient taking differently: Inject 12 Units into the skin daily before breakfast. 04/12/21  Yes Gherghe, Vella Redhead, MD  ?Multiple Vitamins-Minerals (ADULT ONE DAILY GUMMIES) CHEW Chew 1 tablet by mouth daily.   Yes [provider]  ?nitroGLYCERIN (NITROSTAT) 0.4 MG SL tablet DISSOLVE 1 TABLET UNDER THE TONGUE EVERY 5 MINUTES AS NEEDED FOR CHEST PAIN ?Patient taking differently: Place 0.4 mg under the tongue every 5 (five) minutes as needed for chest pain. 04/26/21  Yes Jettie Booze, MD  ?rosuvastatin (CRESTOR) 20 MG tablet Take 1 tablet (20 mg total) by mouth daily. 04/26/21  Yes Jettie Booze, MD  ?Continuous Blood Gluc Sensor ([place]) MISC 1 each by Does not apply route daily. ?Patient taking differently: Inject 1 Device into the skin every 14 (fourteen) days. 04/12/21   Gherghe, [employer]  M, MD  ?polyvinyl alcohol (LIQUIFILM TEARS) 1.4 % ophthalmic solution Place 1 drop into both eyes 3 (three) times daily as needed for dry eyes.    [provider]  ?   ? ?Allergies    ?Patient has no known allergies.   ? ?Review of Systems   ?Review of Systems  ?Respiratory:  Positive for shortness of breath.   ?Skin:  Positive for wound.  ?All other systems reviewed and are negative. ? ?Physical Exam ?Updated Vital Signs ?BP (!)  139/105   Pulse 84   Temp 98.1 ?F (36.7 ?C) (Oral)   Resp (!) 27   SpO2 99%  ?Physical Exam ?Vitals and nursing note reviewed.  ?Constitutional:   ?   General: He is not in acute distress. ?   Appearance: Normal appearance.  ?HENT:  ?   Head: Normocephalic and atraumatic.  ?Eyes:  ?   General:     ?   Right eye: No discharge.     ?   Left eye: No discharge.  ?Cardiovascular:  ?   Rate and Rhythm: Normal rate and regular rhythm.  ?   Heart sounds: No murmur heard. ?  No friction rub. No gallop.  ?Pulmonary:  ?   Effort: Pulmonary effort is normal.  ?   Breath sounds: Normal breath sounds.  ?   Comments: Patient with some occasional crackles, as well as tachypnea.  No focal consolidation noted.  No wheezing, stridor, rhonchi. ?Abdominal:  ?   General: Bowel sounds are normal.  ?   Palpations: Abdomen is soft.  ?Musculoskeletal:  ?   Comments: Palpated all of patient's extremities and patient denies any pain in arms, legs, chest, abdomen.  Some pain at the top of scalp with wound but not out of proportion with exam.  ?Skin: ?   General: Skin is warm and dry.  ?   Capillary Refill: Capillary refill takes less than 2 seconds.  ?   Comments: Small abrasion versus laceration at the apex of the scalp, as well as skin tears on the right upper arm.  Bruising throughout the rest of the skin without active bleeding.  ?Neurological:  ?   Mental Status: He is alert and oriented to person, place, and time.  ?   Comments: Cranial nerves II through XII grossly intact.  Intact finger-nose, intact heel-to-shin.  Romberg negative, gait normal.  Alert and oriented x3.  Moves all 4 limbs spontaneously, normal coordination.  No pronator drift.  Intact strength 5 out of 5 bilateral upper and lower extremities. ? ?  ?Psychiatric:     ?   Mood and Affect: Mood normal.     ?   Behavior: Behavior normal.  ? ? ?ED Results / Procedures / Treatments   ?Labs ?(all labs ordered are listed, but only abnormal results are displayed) ?Labs Reviewed   ?CBC WITH DIFFERENTIAL/PLATELET - Abnormal; Notable for the following components:  ?    Result Value  ? Platelets 131 (*)   ? All other components within normal limits  ?COMPREHENSIVE METABOLIC PANEL - Abnormal; Notable for the following components:  ? CO2 35 (*)   ? Glucose, Bld 182 (*)   ? BUN 26 (*)   ? Calcium 8.3 (*)   ? Total Protein 6.3 (*)   ? Anion gap 4 (*)   ? All other components within normal limits  ?BRAIN NATRIURETIC PEPTIDE - Abnormal; Notable for the following components:  ? B Natriuretic Peptide 1,161.9 (*)   ?  All other components within normal limits  ?RESP PANEL BY RT-PCR (FLU A&B, COVID) ARPGX2  ?BLOOD GAS, ARTERIAL  ?TROPONIN I (HIGH SENSITIVITY)  ? ? ?EKG ?EKG Interpretation ? ?Date/Time:  Saturday October 09 2021 12:13:25 EDT ?Ventricular Rate:  82 ?PR Interval:    ?QRS Duration: 94 ?QT Interval:  403 ?QTC Calculation: 471 ?R Axis:   76 ?Text Interpretation: Accelerated junctional rhythm Probable anterior infarct, old Borderline T abnormalities, inferior leads since last tracing no significant change Confirmed by Daleen Bo [phone]) on 09/22/2021 1:18:10 PM ? ?Radiology ?DG Chest 2 View ? ?Result Date: 09/24/2021 ?CLINICAL DATA:  [place] out of bed last night. EXAM: CHEST - 2 VIEW COMPARISON:  Chest x-ray dated April 10, 2021. FINDINGS: The heart size and mediastinal contours are within normal limits. Prior CABG. Trace left pleural effusion. No consolidation or pneumothorax. No acute osseous abnormality. IMPRESSION: 1. Trace left pleural effusion. Electronically Signed   By: Titus Dubin M.D.   On: 10/18/2021 13:41  ? ?CT Head Wo Contrast ? ?Result Date: 09/26/2021 ?CLINICAL DATA:  Head trauma, moderate-severe; Neck trauma (Age >= 65y) EXAM: CT HEAD WITHOUT CONTRAST CT CERVICAL SPINE WITHOUT CONTRAST TECHNIQUE: Multidetector CT imaging of the head and cervical spine was performed following the standard protocol without intravenous contrast. Multiplanar CT image reconstructions of the  cervical spine were also generated. RADIATION DOSE REDUCTION: This exam was performed according to the departmental dose-optimization program which includes automated exposure control, adjustment of the mA and/or kV acc

## 2021-10-09 NOTE — ED Notes (Signed)
Critical lab: PO2 [place] less than 31 ?

## 2021-10-09 NOTE — ED Provider Notes (Signed)
?  Face-to-face evaluation ? ? ?History: Patient here for evaluation of syncope.  This was noted around the time when he fell, at 2 AM this morning.  It is unclear if he passed out then fell or fell then passed out.  EMS was summoned and came to his home at that time.  They did not transport him because he did not want to come here.  He saw his cardiologist, 2 days ago at that time was asked to maintain on Lasix for peripheral edema.  He does not have suspected acute congestive heart failure.  He does have low oxygen, and has been followed closely for that by his PCP, cardiologist and home health nurses.  Patient has to breathe deeply to maintain normal oxygenation with oxygen saturation. ? ?Physical exam: Alert elderly male with mild confusion.  He is alert and calm.  Heart regular rate and rhythm.  Lungs clear anteriorly.  No increased work of breathing or wheezing noted. ? ?MDM: Evaluation for  ?Chief Complaint  ?Patient presents with  ? Shortness of Breath  ?  ? ?Patient with syncope, causing head injury without serious injuries.  Ongoing hypoxia, possibly contributing.  No evidence for acute congestive heart failure on initial imaging.  CTA chest ordered to evaluate for PE or other pulmonary complications.  Patient is debilitated and will need hospitalization for management regardless of findings on CTA. ? ?Medical screening examination/treatment/procedure(s) were conducted as a shared visit with non-physician practitioner(s) and myself.  I personally evaluated the patient during the encounter ? ?  ?Daleen Bo, MD ?10/10/21 1515 ? ?

## 2021-10-09 NOTE — H&P (Signed)
? ?HPI ? ? ?RANBIR CHEW GEX:[id number] DOB: 1932-10-22 DOA: 10/07/2021 ? ?PCP: Aretta Nip, MD  ? ?Chief Complaint: SOB, fall ? ?HPI:  ?5 community dwelling white male ?CAD CABG 1999 K LAD with stent ?HFpEF ?DM TY 2 with prior hypoglycemic issues on prior admission in 2022 ?CKD 3B-baseline creatinine about 1.6 ?HTN HLD COPD, some mild cognitive impairment- ?Recent admit 2/3-08/30/18/2023 LL cellulitis Rx initially IV and then oral antibiotics--his Lasix was discontinued on discharge ? ?Recent visit Dr. Irish Lack office 10/07/2021 Theressa Millard his note]-about 13 pound weight gain from last admission noted to go up from  ?210-223 lb ?At that office  visit he was told to continue his Lasix at 80 mg in the morning and home health was ordered ? ?Had also seen Dr. Lamonte Sakai recently and had declined the use of oxygen ? ?Represented to the hospital with a fall and slight LOC-it appears that the patient fell out of bed accidentally-he lives alone with his wife-he refused EMS transport initially but then called his son in the middle of the night-found to be 88% on room air when worked up by EMS ? ?He was also found to have a large bruise on the top of his head ? ?On my evaluation of the patient he has a slightly increased work of breathing with accessory muscle use-his main focus and concern at this time is "I want to go home" he tells me that he is trying to get out of bed and that he needs to know where his family is ?It is difficult to get a straight history from him because of his tangentiality-eventually his son Jenny Reichmann phone number [phone] showed up with the patient's spouse Vicente Males and he explained to me that patient had been short of breath for several days had gone to the cardiologist but has not been taking Lasix ? ?On working up I feel that the patient may require BiPAP and he will probably need to be on the stepdown unit at least temporarily to mobilize his edema into his systemic vasculature with this ?We will get  an ABG probably later on ?He was given Lasix 80 mg IV initially and I have repeated a dose 40 mg when I saw him at around 5 PM ? ? ?Review of Systems:  ? ? ?Pertinent +'s: + SOB, + DOE, + accessory muscle use, + cough, + weakness ?Pertinent -"s: ? ?ED Course: Given Lasix, kept on oxygen, started on BiPAP in ED ? ? ?Past Medical History:  ?Diagnosis Date  ? Anginal pain ([place]) 02/08/12  ? Arthritis   ? "all over"  ? CKD (chronic kidney disease), stage III ([place])   ? COPD (chronic obstructive pulmonary disease) ([place])   ? Coronary artery disease   ? a. MI/CABG in 9/99 at [facility], [place] with (RIMA to LAD, LIMA to OM, SVG to PDA. b. LAD drug eluting stent in 3/09 through the graft to the distal LAD.  ? Exertional dyspnea   ? "doesn't take much these days"  ? H/O hiatal hernia   ? Hyperlipemia   ? Hypertension   ? Hypothyroidism   ? Hypoxia   ? Stroke [facility]) 09/2010  ? denies residual  ? Type II diabetes mellitus ([place])   ? ?Past Surgical History:  ?Procedure Laterality Date  ? CATARACT EXTRACTION W/ INTRAOCULAR LENS  IMPLANT, BILATERAL  ~ 2007  ? COLONOSCOPY W/ POLYPECTOMY  09/2011  ? "removed 7"  ? CORONARY ANGIOPLASTY WITH STENT PLACEMENT  ~ 2004  ? "  1"  ? CORONARY ARTERY BYPASS GRAFT  1999  ? CABG X4  ? EYE SURGERY    ? RETINAL DETACHMENT SURGERY  10/2011  ? right eye  ? ? reports that he quit smoking about 28 years ago. His smoking use included cigarettes. He has a 70.00 pack-year smoking history. He quit smokeless tobacco use about 26 years ago. He reports that he does not drink alcohol and does not use drugs. ? ?Mobility: Pretty much independent at baseline ? ?No Known Allergies ?Family History  ?Problem Relation Age of Onset  ? Heart attack Mother   ? ?Prior to Admission medications   ?Medication Sig Start Date End Date Taking? Authorizing Provider  ?acetaminophen (TYLENOL) 500 MG tablet Take 1,000 mg by mouth every 6 (six) hours as needed for mild pain.    [provider]  ?aspirin EC 81 MG  tablet Take 1 tablet (81 mg total) by mouth daily. 10/21/15   Jettie Booze, MD  ?Cholecalciferol (VITAMIN D3) 50 MCG (2000 UT) TABS Take 2,000 Units by mouth daily.    [provider]  ?Continuous Blood Gluc Sensor ([place]) MISC 1 each by Does not apply route daily. ?Patient taking differently: Inject 1 Device into the skin every 14 (fourteen) days. 04/12/21   Caren Griffins, MD  ?cyanocobalamin 1000 MCG tablet Take 1,000 mcg by mouth daily.    [provider]  ?furosemide (LASIX) 80 MG tablet Take 1 tablet (80 mg total) by mouth daily. 10/07/21   Jettie Booze, MD  ?Glucosamine-Chondroitin-MSM 500-200-150 MG TABS Take 1 tablet by mouth daily.    [provider]  ?insulin aspart (NOVOLOG) 100 UNIT/ML injection Inject 12 Units into the skin daily before lunch. ?Patient taking differently: Inject 12 Units into the skin See admin instructions. Inject 12 units into the skin before breakfast and PER SLIDING SCALE before lunch and supper/evening meal 04/12/21   Gherghe, Vella Redhead, MD  ?insulin detemir (LEVEMIR) 100 UNIT/ML injection Inject 0.12 mLs (12 Units total) into the skin daily. ?Patient taking differently: Inject 12 Units into the skin daily before breakfast. 04/12/21   Gherghe, Vella Redhead, MD  ?Multiple Vitamins-Minerals (ADULT ONE DAILY GUMMIES) CHEW Chew 1 tablet by mouth daily.    [provider]  ?nitroGLYCERIN (NITROSTAT) 0.4 MG SL tablet DISSOLVE 1 TABLET UNDER THE TONGUE EVERY 5 MINUTES AS NEEDED FOR CHEST PAIN ?Patient taking differently: Place 0.4 mg under the tongue every 5 (five) minutes as needed for chest pain. 04/26/21   Jettie Booze, MD  ?polyvinyl alcohol (LIQUIFILM TEARS) 1.4 % ophthalmic solution Place 1 drop into both eyes 3 (three) times daily as needed for dry eyes.    [provider]  ?rosuvastatin (CRESTOR) 20 MG tablet Take 1 tablet (20 mg total) by mouth daily. 04/26/21   Jettie Booze, MD   ?umeclidinium bromide (INCRUSE ELLIPTA) 62.5 MCG/INH AEPB Inhale 1 puff into the lungs daily as needed for wheezing.    [provider]  ? ? ?Physical Exam: ? ?Vitals:  ? 10/15/2021 1540 10/11/2021 1600  ?BP: (!) 163/73 (!) 139/105  ?Pulse: 84 85  ?Resp: (!) 27 (!) 22  ?Temp:    ?SpO2: 99% 97%  ? ? ?Coherent but slightly confused at times white male in no distress-he has a large dried wound on the top of his head with a scab ?Chest is relatively clear with no rales no rhonchi no adventitious sounds ?He has JVD to the jaw I do not  appreciate any carotid bruit ?He is slightly tachycardic in the low 80s to 100s with what looks like an irregularly irregular heart rate ?Accessory muscles are in use although he is able to verbalize-he needs to take a break while talking to me ?S1-S2 no murmur no rub no gallop ?No lower extremity edema ?No other rash-I did not examine his sacrum ?Power seems intact ?He has a male urinal on ? ?I have personally reviewed following labs and imaging studies ? ?Labs:  ?BNP 1161 ?Bicarb 35 ?BUN/creatinine 26/1.05 (baseline is 1.3) ?Platelets are 131 ?COVID PCR is pending, troponin is pending ? ?Imaging studies:  ?CT chest negative for pulmonary embolism, small to moderate pleural effusions left greater than sign right-hepatic vein/IVC reflux Bronchial thickening with mucous plugging lower lobes?  Small airway disease-patulous esophagus ?CT cervical spine multilevel degenerative changes, old microvascular injuries no acute stroke ? ?Medical tests:  ?EKG independently reviewed: Sinus tachycardia-PR interval 0.20 with QRS axis being about 70 degrees-he does have a left bundle branch block with?  J-point elevation in the ST-T wave segments in V1 and V2 on review I do not appreciate any reciprocal changes-EKG is abnormal and troponin was ordered based on this ? ?Test discussed with performing physician: ?Yes I discussed with Mr. Darrick Meigs in the ED ? ?Decision to obtain old records:   ?Yes ? ?Review and summation of old records:  ?Yes ? ?Principal Problem: ?  Acute clinical systolic heart failure ([place]) ? ? ?Assessment/Plan ? ?Acute decompensated HFpEF with likely right-sided symptomatology ?Acute respir

## 2021-10-09 NOTE — Progress Notes (Signed)
Pt removed from BIPAP and placed on 2 LPM [place].  Pt tolerating well at this time.  Pt transported to ICU on [place] with no apparent complications.  BIPAP at bedside on stand-by.  ?

## 2021-10-10 ENCOUNTER — Inpatient Hospital Stay (HOSPITAL_COMMUNITY): Payer: Medicare Other

## 2021-10-10 ENCOUNTER — Encounter (HOSPITAL_COMMUNITY): Payer: Self-pay | Admitting: Family Medicine

## 2021-10-10 ENCOUNTER — Other Ambulatory Visit: Payer: Self-pay

## 2021-10-10 DIAGNOSIS — I5021 Acute systolic (congestive) heart failure: Secondary | ICD-10-CM | POA: Diagnosis not present

## 2021-10-10 DIAGNOSIS — I5031 Acute diastolic (congestive) heart failure: Secondary | ICD-10-CM | POA: Diagnosis not present

## 2021-10-10 LAB — BLOOD GAS, ARTERIAL
Acid-Base Excess: 13.6 mmol/L — ABNORMAL HIGH (ref 0.0–2.0)
Bicarbonate: 41.5 mmol/L — ABNORMAL HIGH (ref 20.0–28.0)
FIO2: 32 %
O2 Saturation: 98.1 %
Patient temperature: 37
pCO2 arterial: 67 mmHg (ref 32–48)
pH, Arterial: 7.4 (ref 7.35–7.45)
pO2, Arterial: 78 mmHg — ABNORMAL LOW (ref 83–108)

## 2021-10-10 LAB — COMPREHENSIVE METABOLIC PANEL
ALT: 13 U/L (ref 0–44)
AST: 19 U/L (ref 15–41)
Albumin: 4 g/dL (ref 3.5–5.0)
Alkaline Phosphatase: 46 U/L (ref 38–126)
Anion gap: 8 (ref 5–15)
BUN: 24 mg/dL — ABNORMAL HIGH (ref 8–23)
CO2: 37 mmol/L — ABNORMAL HIGH (ref 22–32)
Calcium: 8.8 mg/dL — ABNORMAL LOW (ref 8.9–10.3)
Chloride: 96 mmol/L — ABNORMAL LOW (ref 98–111)
Creatinine, Ser: 1.07 mg/dL (ref 0.61–1.24)
GFR, Estimated: >60 mL/min (ref >60)
Glucose, Bld: 147 mg/dL — ABNORMAL HIGH (ref 70–99)
Potassium: 3.9 mmol/L (ref 3.5–5.1)
Sodium: 141 mmol/L (ref 135–145)
Total Bilirubin: 0.8 mg/dL (ref 0.3–1.2)
Total Protein: 6.8 g/dL (ref 6.5–8.1)

## 2021-10-10 LAB — ECHOCARDIOGRAM COMPLETE
AR max vel: 2.51 cm2
AV Area VTI: 2.35 cm2
AV Area mean vel: 2.04 cm2
AV Mean grad: 3 mmHg
AV Peak grad: 5 mmHg
Ao pk vel: 1.12 m/s
Calc EF: 45 %
Height: 71 in
MV M vel: 5.85 m/s
MV Peak grad: 136.9 mmHg
S' Lateral: 4.35 cm
Single Plane A2C EF: 38.5 %
Single Plane A4C EF: 54.7 %
Weight: 3368.63 oz

## 2021-10-10 LAB — CBC
HCT: 45.2 % (ref 39.0–52.0)
Hemoglobin: 14 g/dL (ref 13.0–17.0)
MCH: 30.3 pg (ref 26.0–34.0)
MCHC: 31 g/dL (ref 30.0–36.0)
MCV: 97.8 fL (ref 80.0–100.0)
Platelets: 139 10*3/uL — ABNORMAL LOW (ref 150–400)
RBC: 4.62 MIL/uL (ref 4.22–5.81)
RDW: 14 % (ref 11.5–15.5)
WBC: 5.2 10*3/uL (ref 4.0–10.5)
nRBC: 0 % (ref 0.0–0.2)

## 2021-10-10 LAB — GLUCOSE, CAPILLARY
Glucose-Capillary: 155 mg/dL — ABNORMAL HIGH (ref 70–99)
Glucose-Capillary: 149 mg/dL — ABNORMAL HIGH (ref 70–99)
Glucose-Capillary: 190 mg/dL — ABNORMAL HIGH (ref 70–99)
Glucose-Capillary: 153 mg/dL — ABNORMAL HIGH (ref 70–99)
Glucose-Capillary: 121 mg/dL — ABNORMAL HIGH (ref 70–99)

## 2021-10-10 LAB — BRAIN NATRIURETIC PEPTIDE: B Natriuretic Peptide: 1077.4 pg/mL — ABNORMAL HIGH (ref 0.0–100.0)

## 2021-10-10 LAB — MRSA NEXT GEN BY PCR, NASAL: MRSA by PCR Next Gen: DETECTED — AB

## 2021-10-10 MED ORDER — HYDROXYZINE HCL 50 MG/ML IM SOLN
25.0000 mg | Freq: Once | INTRAMUSCULAR | Status: AC
  Administered 2021-10-10: 25 mg via INTRAMUSCULAR
  Filled 2021-10-10: qty 0.5

## 2021-10-10 MED ORDER — ACETAZOLAMIDE ER 500 MG PO CP12
500.0000 mg | ORAL_CAPSULE | Freq: Two times a day (BID) | ORAL | Status: DC
  Administered 2021-10-10 – 2021-10-11 (×3): 500 mg via ORAL
  Filled 2021-10-10 (×3): qty 1

## 2021-10-10 NOTE — Progress Notes (Signed)
? ?  Echocardiogram ?2D Echocardiogram has been performed. ? ?Beryle Beams ?10/10/2021, 8:27 AM ?

## 2021-10-10 NOTE — Progress Notes (Signed)
Patient's output measurement are not 100% accurate. Patient has a male purewick that has failed x2 large incontinent episodes requiring total bed change. ?

## 2021-10-10 NOTE — Progress Notes (Signed)
?   10/10/21 1429  ?Notify: Provider  ?Provider Name/Title J,Samtani MD  ?Date Provider Notified 10/10/21  ?Time Provider Notified 1429  ?Notification Type Page  ?Notification Reason Critical result ?(CO2 67)  ?Provider response At bedside  ?Date of Provider Response 10/10/21  ?Time of Provider Response 1430  ? ? ?

## 2021-10-10 NOTE — Progress Notes (Addendum)
Patient placed on BiPaP for elevated C02 level per Dr. Arlyss Queen wishes.  Oral care provided and upper, lower dentures removed before reinitiating bipap mask. Patient in bed with bed alarm on. ?

## 2021-10-10 NOTE — TOC Initial Note (Signed)
Transition of Care (TOC) - Initial/Assessment Note  ? ? ?Patient Details  ?Name: Luke [name] ?MRN: [id number] ?Date of Birth: 09-24-1932 ? ?Transition of Care (TOC) CM/SW Contact:    ?Tawanna Cooler, RN ?Phone Number: ?10/10/2021, 10:10 AM ? ?Clinical Narrative:                 ? ?Lives with wife, son also involved.  TOC CM following for potential discharge needs.   ? ? ?Expected Discharge Plan: [place] ?Barriers to Discharge: Continued Medical Work up ? ? ?Patient Goals and CMS Choice ?Patient states their goals for this hospitalization and ongoing recovery are:: return home ?  ? ?Expected Discharge Plan and Services ?Expected Discharge Plan: [place] ?  ?  ? Living arrangements for the past 2 months: [place] ?                ?  ?Prior Living Arrangements/Services ?Living arrangements for the past 2 months: [place] ?Lives with:: Spouse ?Patient language and need for interpreter reviewed:: Yes ?       ?Need for Family Participation in Patient Care: Yes (Comment) ?Care giver support system in place?: Yes (comment) ?  ?Criminal Activity/Legal Involvement Pertinent to Current Situation/Hospitalization: No - Comment as needed ? ?  ?Alcohol / Substance Use: Not Applicable ?Psych Involvement: No (comment) ? ?Admission diagnosis:  Hypoxia [R09.02] ?Heart failure ([place]) [I50.9] ?Acute clinical systolic heart failure ([place]) [I50.21] ?Syncope, unspecified syncope type [R55] ?Patient Active Problem List  ? Diagnosis Date Noted  ? Acute clinical systolic heart failure ([place]) 09/26/2021  ? Heart failure ([place]) 10/07/2021  ? Recurrent cellulitis of lower leg 08/27/2021  ? Acute encephalopathy 04/10/2021  ? B12 deficiency 11/23/2020  ? Vitamin D deficiency 11/23/2020  ? Falls 11/23/2020  ? Syncope and collapse 11/23/2020  ? Hypoglycemia 11/23/2020  ? COPD with acute exacerbation ([place]) 11/23/2020  ? COPD (chronic obstructive pulmonary disease) ([place]) 09/18/2017  ?  Hypoxemia 09/18/2017  ? Primary hypothyroidism 09/19/2016  ? Stage 3a chronic kidney disease ([place]) 09/19/2016  ? Type 2 diabetes mellitus with hyperglycemia, with long-term current use of insulin ([place]) 09/19/2016  ? CRI (chronic renal insufficiency) 06/09/2014  ? Overweight 06/09/2014  ? Coronary atherosclerosis of native coronary artery 12/06/2013  ? Mixed hyperlipidemia 12/06/2013  ? Nonspecific abnormal results of cardiovascular function study 12/06/2013  ? Chronic diastolic heart failure ([place]) 12/06/2013  ? Essential hypertension, benign 12/06/2013  ? AKI (acute kidney injury) ([place]) 03/17/2013  ? N&V (nausea and vomiting) 03/17/2013  ? Diarrhea 03/17/2013  ? DM (diabetes mellitus) ([place]) 03/17/2013  ? Hypotension 03/17/2013  ? Dizziness 03/17/2013  ? ?PCP:  Aretta Nip, MD ?Pharmacy:   ?[id number] [place] [facility] [address]?Phone: [phone]: [phone]? ? ?Readmission Risk Interventions ?Readmission Risk Prevention Plan 04/12/2021  ?Transportation Screening Complete  ?PCP or Specialist Appt within 5-7 Days Complete  ?Home Care Screening Complete  ?Medication Review (RN CM) Complete  ?Some recent data might be hidden  ? ? ? ?

## 2021-10-10 NOTE — Evaluation (Signed)
Clinical/Bedside Swallow Evaluation ?Patient Details  ?Name: Luke [name] ?MRN: [id number] ?Date of Birth: 1932/08/20 ? ?Today's Date: 10/10/2021 ?Time: SLP Start Time (ACUTE ONLY): 9629 SLP Stop Time (ACUTE ONLY): 5284 ?SLP Time Calculation (min) (ACUTE ONLY): 15 min ? ?Past Medical History:  ?Past Medical History:  ?Diagnosis Date  ? Anginal pain ([place]) 02/08/12  ? Arthritis   ? "all over"  ? CKD (chronic kidney disease), stage III ([place])   ? COPD (chronic obstructive pulmonary disease) ([place])   ? Coronary artery disease   ? a. MI/CABG in 9/99 at [facility], [place] with (RIMA to LAD, LIMA to OM, SVG to PDA. b. LAD drug eluting stent in 3/09 through the graft to the distal LAD.  ? Exertional dyspnea   ? "doesn't take much these days"  ? H/O hiatal hernia   ? Hyperlipemia   ? Hypertension   ? Hypothyroidism   ? Hypoxia   ? Stroke [facility]) 09/2010  ? denies residual  ? Type II diabetes mellitus ([place])   ? ?Past Surgical History:  ?Past Surgical History:  ?Procedure Laterality Date  ? CATARACT EXTRACTION W/ INTRAOCULAR LENS  IMPLANT, BILATERAL  ~ 2007  ? COLONOSCOPY W/ POLYPECTOMY  09/2011  ? "removed 7"  ? CORONARY ANGIOPLASTY WITH STENT PLACEMENT  ~ 2004  ? "1"  ? CORONARY ARTERY BYPASS GRAFT  1999  ? CABG X4  ? EYE SURGERY    ? RETINAL DETACHMENT SURGERY  10/2011  ? right eye  ? ?HPI:  ?88 y.o. male presented to [facility] after sustaining a fall with LOC. Dx acute resp failure, CT head negative after fall, abnormal EKGPMHx CAD, COPD, stroke 2012, HH, DM2. Needing BiPAP due to Co2 retention. Notes indicate patulous esophagus with some concern for aspiration.  Dx 2013 esophagram showed disruption of all primary esophageal peristaltic waves.  ?  ?Assessment / Plan / Recommendation  ?Clinical Impression ? Pt was seen for clinical swallow assessment. RN took him off BiPAP temporarily in order to participate. Oral mechanism exam normal. Upper and lower dentures cleaned and placed by pt.  He was able to feed himself  crackers, applesauce and water with no acute esophageal symptoms. He demonstrated thorough mastication, the appearance of a brisk swallow, no s/s of aspiration. No concern for an oropharyngeal dysphagia at this time. Recommend advancing diet to regular solids, thin liquids.  Maintain HOB elevation for at least 30 minutes after PO intake; keep elevated to approx 30 degrees at night- basic esophageal precautions.  No acute SLP needs are identified - D/W RN. SLP will sign off. ?SLP Visit Diagnosis: Dysphagia, unspecified (R13.10) ?   ?Aspiration Risk ? No limitations  ?  ?Diet Recommendation   Regular solids, thin liquids ? ?Medication Administration: Whole meds with puree  ?  ?Other  Recommendations Oral Care Recommendations: Oral care BID   ? ?Recommendations for follow up therapy are one component of a multi-disciplinary discharge planning process, led by the attending physician.  Recommendations may be updated based on patient status, additional functional criteria and insurance authorization. ? ?Follow up Recommendations No SLP follow up  ? ? ?  ? ? ?Swallow Study   ?General HPI: 88 y.o. male presented to [facility] after sustaining a fall with LOC. Dx acute resp failure, CT head negative after fall, abnormal EKGPMHx CAD, COPD, stroke 2012, HH, DM2. Notes indicate patulous esophagus with some concern for aspiration.  Dx 2013 esophagram showed disruption of all primary esophageal peristaltic waves. ?Type of Study: Bedside Swallow  Evaluation ?Previous Swallow Assessment: no ?Diet Prior to this Study: Thin liquids ?Temperature Spikes Noted: No ?Respiratory Status: Other (comment) (BiPAP) ?History of Recent Intubation: No ?Behavior/Cognition: Alert;Pleasant mood;Confused ?Oral Cavity Assessment: Within Functional Limits ?Oral Care Completed by SLP: Recent completion by staff ?Oral Cavity - Dentition: Dentures, top;Dentures, bottom ?Vision: Functional for self-feeding ?Self-Feeding Abilities: Able to feed self ?Baseline Vocal  Quality: Hoarse ?Volitional Swallow: Able to elicit  ?  ?Oral/Motor/Sensory Function Overall Oral Motor/Sensory Function: Within functional limits   ?Ice Chips Ice chips: Within functional limits   ?Thin Liquid Thin Liquid: Within functional limits  ?  ?Nectar Thick Nectar Thick Liquid: Not tested   ?Honey Thick Honey Thick Liquid: Not tested   ?Puree Puree: Within functional limits   ?Solid ? ? ?     ? ?  ? ?Juan Quam Laurice ?10/10/2021,4:46 PM ? ?Motty Borin L. Paetyn Pietrzak, MA CCC/SLP ?Acute Rehabilitation Services ?Office number [phone] ?Pager [phone] ? ? ?

## 2021-10-10 NOTE — Progress Notes (Signed)
Pt currently on 3 LPM [place] and tolerating well at this time.  Pt in no acute distress, BIPAP not indicated at this time.  ?

## 2021-10-10 NOTE — Evaluation (Signed)
Physical Therapy Evaluation ?Patient Details ?Name: Luke [name] ?MRN: [id number] ?DOB: 1933-07-13 ?Today's Date: 10/10/2021 ? ?History of Present Illness ? This 88 years old male with PMH significant for COPD, CAD, hypertension, hyperlipidemia, CKD stage IIIb, hypothyroidism, diabetes mellitus type 2 presented in the ED with complaints of fall at home with head wound and dx with acute decompensated HFpEF and acute repiratory failure 2* heart failure  ?Clinical Impression ? Pt admitted as above and presenting with functional mobility limitations 2* generalized weakness, balance deficits with high risk of falling, questionable safety awareness and limited endurance.  Pt is currently requiring significant assist for performance of all basic mobility tasks and would benefit from follow up rehab at SNF level to maximize IND and safety prior to return home with elderly wife. ?   ? ?Recommendations for follow up therapy are one component of a multi-disciplinary discharge planning process, led by the attending physician.  Recommendations may be updated based on patient status, additional functional criteria and insurance authorization. ? ?Follow Up Recommendations Skilled nursing-short term rehab (<3 hours/day) ? ?  ?Assistance Recommended at Discharge Frequent or constant Supervision/Assistance  ?Patient can return home with the following ? A lot of help with walking and/or transfers;A little help with bathing/dressing/bathroom;Assistance with cooking/housework;Assist for transportation;Help with stairs or ramp for entrance ? ?  ?Equipment Recommendations None recommended by PT  ?Recommendations for Other Services ?    ?  ?Functional Status Assessment Patient has had a recent decline in their functional status and demonstrates the ability to make significant improvements in function in a reasonable and predictable amount of time.  ? ?  ?Precautions / Restrictions Precautions ?Precautions: Fall ?Restrictions ?Weight  Bearing Restrictions: No  ? ?  ? ?Mobility ? Bed Mobility ?Overal bed mobility: Needs Assistance ?Bed Mobility: Supine to Sit ?  ?  ?Supine to sit: Supervision ?  ?  ?General bed mobility comments: for safety but no physical assist ?  ? ?Transfers ?Overall transfer level: Needs assistance ?Equipment used: Rolling walker (2 wheels) ?Transfers: Sit to/from Stand ?Sit to Stand: Min assist, From elevated surface, +2 physical assistance, +2 safety/equipment ?  ?  ?  ?  ?  ?General transfer comment: cues for use of UEs to self assist;  Physical assist to bring wt up and fwd and to balance in standing with RW ?  ? ?Ambulation/Gait ?Ambulation/Gait assistance: Min assist, Mod assist, +2 safety/equipment ?Gait Distance (Feet): 48 Feet ?Assistive device: Rolling walker (2 wheels) ?Gait Pattern/deviations: Step-to pattern, Step-through pattern, Decreased step length - right, Decreased step length - left, Shuffle, Trunk flexed ?  ?  ?  ?General Gait Details: cues for posture, position from RW and pace;  physical assist for balance/supprot and RW management ? ?Stairs ?  ?  ?  ?  ?  ? ?Wheelchair Mobility ?  ? ?Modified Rankin (Stroke Patients Only) ?  ? ?  ? ?Balance Overall balance assessment: Needs assistance ?Sitting-balance support: Feet supported, No upper extremity supported ?Sitting balance-Leahy Scale: Fair ?  ?  ?Standing balance support: Bilateral upper extremity supported ?Standing balance-Leahy Scale: Poor ?  ?  ?  ?  ?  ?  ?  ?  ?  ?  ?  ?  ?   ? ? ? ?Pertinent Vitals/Pain Pain Assessment ?Pain Assessment: No/denies pain  ? ? ?Home Living Family/patient expects to be discharged to:: Private residence ?Living Arrangements: Spouse/significant other ?Available Help at Discharge: Family;Available PRN/intermittently (son lives 30 minutes away) ?Type of Home:  House ?Home Access: Ramped entrance ?  ?  ?  ?Home Layout: One level ?Home Equipment: Standard Walker;Cane - single point ?   ?  ?Prior Function Prior Level of  Function : Independent/Modified Independent ?  ?  ?  ?  ?  ?  ?Mobility Comments: used SW, still drives"I probably shouldn't" ?  ?  ? ? ?Hand Dominance  ? Dominant Hand: Right ? ?  ?Extremity/Trunk Assessment  ? Upper Extremity Assessment ?Upper Extremity Assessment: Generalized weakness ?  ? ?Lower Extremity Assessment ?Lower Extremity Assessment: Generalized weakness ?  ? ?   ?Communication  ? Communication: No difficulties  ?Cognition Arousal/Alertness: Awake/alert ?Behavior During Therapy: [facility] for tasks assessed/performed, Impulsive ?Overall Cognitive Status: History of cognitive impairments - at baseline ?  ?  ?  ?  ?  ?  ?  ?  ?  ?  ?  ?  ?  ?  ?  ?  ?  ?  ?  ? ?  ?General Comments   ? ?  ?Exercises    ? ?Assessment/Plan  ?  ?PT Assessment Patient needs continued PT services  ?PT Problem List Decreased strength;Decreased range of motion;Decreased activity tolerance;Decreased balance;Decreased mobility;Decreased knowledge of use of DME;Pain ? ?   ?  ?PT Treatment Interventions DME instruction;Gait training;Functional mobility training;Therapeutic activities;Therapeutic exercise;Balance training;Cognitive remediation;Patient/family education   ? ?PT Goals (Current goals can be found in the Care Plan section)  ?Acute Rehab PT Goals ?Patient Stated Goal: Regain IND and go HOME ?PT Goal Formulation: With patient ?Time For Goal Achievement: 10/24/21 ?Potential to Achieve Goals: Fair ? ?  ?Frequency Min 3X/week ?  ? ? ?Co-evaluation   ?  ?  ?  ?  ? ? ?  ?AM-PAC PT "6 Clicks" Mobility  ?Outcome Measure Help needed turning from your back to your side while in a flat bed without using bedrails?: None ?Help needed moving from lying on your back to sitting on the side of a flat bed without using bedrails?: None ?Help needed moving to and from a bed to a chair (including a wheelchair)?: A Lot ?Help needed standing up from a chair using your arms (e.g., wheelchair or bedside chair)?: A Lot ?Help needed to walk in hospital  room?: A Lot ?Help needed climbing 3-5 steps with a railing? : A Lot ?6 Click Score: 16 ? ?  ?End of Session Equipment Utilized During Treatment: Gait belt;Oxygen ?Activity Tolerance: Patient limited by fatigue ?Patient left: in chair;with call bell/phone within reach;with chair alarm set ?Nurse Communication: Mobility status ?PT Visit Diagnosis: Unsteadiness on feet (R26.81);Muscle weakness (generalized) (M62.81);Difficulty in walking, not elsewhere classified (R26.2);History of falling (Z91.81) ?  ? ?Time: 1336-1400 ?PT Time Calculation (min) (ACUTE ONLY): 24 min ? ? ?Charges:   PT Evaluation ?$PT Eval Low Complexity: 1 Low ?PT Treatments ?$Gait Training: 8-22 mins ?  ?   ? ? ?Debe Coder PT ?Acute Rehabilitation Services ?Pager [phone] ?Office [phone] ? ? ?Ala Kratz ?10/10/2021, 4:05 PM ? ?

## 2021-10-10 NOTE — Progress Notes (Signed)
?PROGRESS NOTE ? ? ?Luke [name]  NTZ:[id number] DOB: 1933-01-29 DOA: 10/05/2021 ?PCP: Aretta Nip, MD  ?Brief Narrative:  ? ?57 community dwelling white male ?CAD CABG 1999 K LAD with stent ?HFpEF ?DM TY 2 with prior hypoglycemic issues on prior admission in 2022 ?CKD 3B-baseline creatinine about 1.6 ?HTN HLD COPD, some mild cognitive impairment- ?Recent admit 2/3-08/30/18/2023 LL cellulitis Rx initially IV and then oral antibiotics--his Lasix was discontinued on discharge ?  ?Recent visit Dr. Irish Lack office 10/07/2021 Theressa Millard his note]-about 13 pound weight gain from last admission noted to go up from  ?210-223 lb ?At that office  visit he was told to continue his Lasix at 80 mg in the morning and home health was ordered ?  ?Had also seen Dr. Lamonte Sakai recently and had declined the use of oxygen ?  ?Represented to the hospital with a fall and slight LOC-it appears that the patient fell out of bed accidentally-he lives alone with his wife-he refused EMS transport initially but then called his son in the middle of the night-found to be 88% on room air when worked up by EMS ?  ?He was also found to have a large bruise on the top of his head ? ?Hospital-Problem based course ? ?Multifactorial acute respiratory failure with 2 components ?1]Decompensated CHF with acute type II respiratory failure secondary to probable undiagnosed CPAP-continue Lasix 60 IV twice daily continue low-dose Toprol 12.5 daily ?2]Probable undiagnosed OSA ?ABG performed 3/18 = CO2 of 69 ?We will keep him on stepdown unit and consider reinitiation of BiPAP based on respiratory assessment ?He is a DNR however I will have further discussions with the patient's son later on this morning ?3] COPD component is small-continue albuterol Atrovent and oral Ellipta ?LOC/fall accidentally out of bed ?Imaging is negative from admission ?Reorient as needed ?Abnormal EKG on admission ?Repeat EKG this a.m. 3/19 shows still some J-point elevation ?Continue  aspirin-he is not having active chest pain ?We will get repeat echo this morning as able ?DM TY 2 ?Goal blood sugar is greater than 150 given advanced age and cognitive issues secondary to hypoglycemia below this ?Continue Levemir 12 units, every 4 CBG, liquid diet-if he is oriented enough we will graduate his diet ?Moderate COPD with compensated respiratory alkalosis ?CO2 has gone up to 37 in the setting of chloride being 96-we will start Diamox this morning ?Repeat labs a.m. ?Patulous esophagus ?Liquid diet for now-await speech eval ? ?DVT prophylaxis: Lovenox ?Code Status: DNR confirmed on admission ?Family Communication: No family at bedside we will call the patient's son later on this morning ?Disposition:  ?Status is: Inpatient ?Remains inpatient appropriate because: Requires probable BiPAP and not ready to transfer to floor as he ?  ?Consultants:  ?None at this time ? ?Procedures: No ? ?Antimicrobials:  ? ?Subjective: ?Nursing staff reports confused overnight pulling at leads needed Vistaril at 1 AM and also was seeing a cat in the room ?He is still sleepy this morning when I assessed him-his family is not available at bedside however I had a long discussion with him yesterday ? ? ?Objective: ?Vitals:  ? 10/10/21 0200 10/10/21 0300 10/10/21 0425 10/10/21 0500  ?BP: (!) 164/72 (!) 145/60 (!) 157/74 (!) 135/49  ?Pulse: 84 87 88 78  ?Resp: (!) 25 (!) 21 (!) 21 20  ?Temp:      ?TempSrc:      ?SpO2: 91% 100% 93% 92%  ?Weight:      ?Height:      ? ? ?  Intake/Output Summary (Last 24 hours) at 10/10/2021 0717 ?Last data filed at 10/10/2021 0000 ?Gross per 24 hour  ?Intake --  ?Output 450 ml  ?Net -450 ml  ? ?Filed Weights  ? 09/26/2021 2028  ?Weight: 95.5 kg  ? ? ?Examination: ? ?Sleepy white male obese-accessory muscles still in use-on nasal cannula at this time ?Neck soft supple ?Chest relatively clear ?Pupils are about 4 mm ?Abdomen obese nontender no rebound no guarding ?Neurologically intact ?Cannot assess psych at  this time ? ? ?Data Reviewed: personally reviewed  ? ?CBC ?   ?Component Value Date/Time  ? WBC 5.2 10/10/2021 0300  ? RBC 4.62 10/10/2021 0300  ? HGB 14.0 10/10/2021 0300  ? HCT 45.2 10/10/2021 0300  ? PLT 139 (L) 10/10/2021 0300  ? MCV 97.8 10/10/2021 0300  ? MCH 30.3 10/10/2021 0300  ? MCHC 31.0 10/10/2021 0300  ? RDW 14.0 10/10/2021 0300  ? LYMPHSABS 0.7 09/25/2021 1256  ? MONOABS 0.6 10/12/2021 1256  ? EOSABS 0.1 09/29/2021 1256  ? BASOSABS 0.0 10/18/2021 1256  ? ?CMP Latest Ref Rng & Units 10/10/2021 09/26/2021 08/30/2021  ?Glucose 70 - 99 mg/dL 147(H) 182(H) 194(H)  ?BUN 8 - 23 mg/dL 24(H) 26(H) 37(H)  ?Creatinine 0.61 - 1.24 mg/dL 1.07 1.05 1.15  ?Sodium 135 - 145 mmol/L 141 138 139  ?Potassium 3.5 - 5.1 mmol/L 3.9 4.2 4.0  ?Chloride 98 - 111 mmol/L 96(L) 99 102  ?CO2 22 - 32 mmol/L 37(H) 35(H) 31  ?Calcium 8.9 - 10.3 mg/dL 8.8(L) 8.3(L) 8.5(L)  ?Total Protein 6.5 - 8.1 g/dL 6.8 6.3(L) -  ?Total Bilirubin 0.3 - 1.2 mg/dL 0.8 0.7 -  ?Alkaline Phos 38 - 126 U/L 46 43 -  ?AST 15 - 41 U/L 19 18 -  ?ALT 0 - 44 U/L 13 14 -  ? ? ? ?Radiology Studies: ?DG Chest 2 View ? ?Result Date: 10/08/2021 ?CLINICAL DATA:  [place] out of bed last night. EXAM: CHEST - 2 VIEW COMPARISON:  Chest x-ray dated April 10, 2021. FINDINGS: The heart size and mediastinal contours are within normal limits. Prior CABG. Trace left pleural effusion. No consolidation or pneumothorax. No acute osseous abnormality. IMPRESSION: 1. Trace left pleural effusion. Electronically Signed   By: Titus Dubin M.D.   On: 10/08/2021 13:41  ? ?CT Head Wo Contrast ? ?Result Date: 10/16/2021 ?CLINICAL DATA:  Head trauma, moderate-severe; Neck trauma (Age >= 65y) EXAM: CT HEAD WITHOUT CONTRAST CT CERVICAL SPINE WITHOUT CONTRAST TECHNIQUE: Multidetector CT imaging of the head and cervical spine was performed following the standard protocol without intravenous contrast. Multiplanar CT image reconstructions of the cervical spine were also generated. RADIATION DOSE  REDUCTION: This exam was performed according to the departmental dose-optimization program which includes automated exposure control, adjustment of the mA and/or kV according to patient size and/or use of iterative reconstruction technique. COMPARISON:  04/10/2021. FINDINGS: CT HEAD FINDINGS Mildly motion limited study. Brain: No evidence of acute large vascular territory infarction, hemorrhage, hydrocephalus, extra-axial collection or mass lesion/mass effect. Remote right parietal infarct. Additional patchy white matter hypoattenuation, nonspecific but compatible with chronic microvascular disease. Cerebral atrophy. Vascular: Calcific intracranial atherosclerosis. No hyperdense vessel identified. Skull: No acute fracture. Sinuses/Orbits: Clear sinuses.  No acute orbital findings Other: No mastoid effusions. CT CERVICAL SPINE FINDINGS Alignment: No substantial sagittal subluxation. Broad levocurvature. Skull base and vertebrae: Vertebral body heights are maintained. No evidence of acute fracture. Soft tissues and spinal canal: No prevertebral fluid or swelling. No visible canal hematoma. Disc levels: Moderate  multilevel degenerative disc disease. Multilevel facet uncovertebral hypertrophy with varying degrees of neural foraminal stenosis. Upper chest: Biapical pleuroparenchymal scarring. No consolidation in the visualized lung apices. Layering left pleural effusion. Other: Calcific atherosclerosis, including at bilateral carotid bifurcations. IMPRESSION: CT head: 1. Motion limited without evidence of acute intracranial abnormality. 2. 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 CT cervical spine: 1. Motion limited without evidence of acute fracture or traumatic malalignment. 2. Multilevel degenerative change, detailed above. 3. Layering left pleural effusion. Electronically Signed   By: Margaretha Sheffield M.D.   On: 10/08/2021 14:08  ? ?CT Angio Chest PE W and/or Wo Contrast ? ?Result  Date: 10/18/2021 ?CLINICAL DATA:  Pulmonary embolism (PE) suspected, high prob Shortness of breath.  Fall from bed last night. EXAM: CT ANGIOGRAPHY CHEST WITH CONTRAST TECHNIQUE: Multidetector CT imaging of the

## 2021-10-10 NOTE — Progress Notes (Signed)
Mr Luke [name] attempted to get out of bed without assistance this AM. He dislodged his IV in his right arm, and removed his external catheter. Patient transferred to [facility] x2 staff members, patient voided and had BM. Returned to bed with safety alarms in place. Lights on, windows open. Reoriented patient to his surrounding.  ?

## 2021-10-11 DIAGNOSIS — I2510 Atherosclerotic heart disease of native coronary artery without angina pectoris: Secondary | ICD-10-CM | POA: Diagnosis not present

## 2021-10-11 DIAGNOSIS — N183 Chronic kidney disease, stage 3 unspecified: Secondary | ICD-10-CM | POA: Diagnosis not present

## 2021-10-11 DIAGNOSIS — I5021 Acute systolic (congestive) heart failure: Secondary | ICD-10-CM | POA: Diagnosis not present

## 2021-10-11 DIAGNOSIS — N179 Acute kidney failure, unspecified: Secondary | ICD-10-CM | POA: Diagnosis not present

## 2021-10-11 LAB — CBC WITH DIFFERENTIAL/PLATELET
Abs Immature Granulocytes: 0.01 10*3/uL (ref 0.00–0.07)
Basophils Absolute: 0 10*3/uL (ref 0.0–0.1)
Basophils Relative: 0 %
Eosinophils Absolute: 0.1 10*3/uL (ref 0.0–0.5)
Eosinophils Relative: 3 %
HCT: 41.2 % (ref 39.0–52.0)
Hemoglobin: 12.8 g/dL — ABNORMAL LOW (ref 13.0–17.0)
Immature Granulocytes: 0 %
Lymphocytes Relative: 6 %
Lymphs Abs: 0.3 10*3/uL — ABNORMAL LOW (ref 0.7–4.0)
MCH: 30 pg (ref 26.0–34.0)
MCHC: 31.1 g/dL (ref 30.0–36.0)
MCV: 96.7 fL (ref 80.0–100.0)
Monocytes Absolute: 0.4 10*3/uL (ref 0.1–1.0)
Monocytes Relative: 9 %
Neutro Abs: 3.8 10*3/uL (ref 1.7–7.7)
Neutrophils Relative %: 82 %
Platelets: 117 10*3/uL — ABNORMAL LOW (ref 150–400)
RBC: 4.26 MIL/uL (ref 4.22–5.81)
RDW: 13.6 % (ref 11.5–15.5)
WBC: 4.6 10*3/uL (ref 4.0–10.5)
nRBC: 0 % (ref 0.0–0.2)

## 2021-10-11 LAB — COMPREHENSIVE METABOLIC PANEL
ALT: 12 U/L (ref 0–44)
AST: 21 U/L (ref 15–41)
Albumin: 3.7 g/dL (ref 3.5–5.0)
Alkaline Phosphatase: 39 U/L (ref 38–126)
Anion gap: 5 (ref 5–15)
BUN: 40 mg/dL — ABNORMAL HIGH (ref 8–23)
CO2: 38 mmol/L — ABNORMAL HIGH (ref 22–32)
Calcium: 8.3 mg/dL — ABNORMAL LOW (ref 8.9–10.3)
Chloride: 94 mmol/L — ABNORMAL LOW (ref 98–111)
Creatinine, Ser: 1.71 mg/dL — ABNORMAL HIGH (ref 0.61–1.24)
GFR, Estimated: 39 mL/min — ABNORMAL LOW (ref >60)
Glucose, Bld: 117 mg/dL — ABNORMAL HIGH (ref 70–99)
Potassium: 4.1 mmol/L (ref 3.5–5.1)
Sodium: 137 mmol/L (ref 135–145)
Total Bilirubin: 0.8 mg/dL (ref 0.3–1.2)
Total Protein: 6.1 g/dL — ABNORMAL LOW (ref 6.5–8.1)

## 2021-10-11 LAB — GLUCOSE, CAPILLARY
Glucose-Capillary: 113 mg/dL — ABNORMAL HIGH (ref 70–99)
Glucose-Capillary: 113 mg/dL — ABNORMAL HIGH (ref 70–99)
Glucose-Capillary: 145 mg/dL — ABNORMAL HIGH (ref 70–99)
Glucose-Capillary: 197 mg/dL — ABNORMAL HIGH (ref 70–99)
Glucose-Capillary: 158 mg/dL — ABNORMAL HIGH (ref 70–99)
Glucose-Capillary: 174 mg/dL — ABNORMAL HIGH (ref 70–99)

## 2021-10-11 LAB — MAGNESIUM: Magnesium: 2.2 mg/dL (ref 1.7–2.4)

## 2021-10-11 MED ORDER — MUPIROCIN 2 % EX OINT
1.0000 | TOPICAL_OINTMENT | Freq: Two times a day (BID) | CUTANEOUS | Status: AC
Start: 2021-10-11 — End: 2021-10-16
  Administered 2021-10-11 – 2021-10-16 (×10): 1 via NASAL
  Filled 2021-10-11: qty 22

## 2021-10-11 MED ORDER — INSULIN DETEMIR 100 UNIT/ML ~~LOC~~ SOLN
6.0000 [IU] | Freq: Every day | SUBCUTANEOUS | Status: DC
  Administered 2021-10-12 – 2021-10-17 (×6): 6 [IU] via SUBCUTANEOUS
  Filled 2021-10-11 (×7): qty 0.06

## 2021-10-11 MED ORDER — ACETAZOLAMIDE ER 500 MG PO CP12
1000.0000 mg | ORAL_CAPSULE | Freq: Two times a day (BID) | ORAL | Status: DC
  Administered 2021-10-11 – 2021-10-15 (×9): 1000 mg via ORAL
  Filled 2021-10-11 (×10): qty 2

## 2021-10-11 MED ORDER — HYDROXYZINE HCL 50 MG/ML IM SOLN
25.0000 mg | Freq: Once | INTRAMUSCULAR | Status: AC
  Administered 2021-10-11: 25 mg via INTRAMUSCULAR
  Filled 2021-10-11: qty 0.5

## 2021-10-11 MED ORDER — METOPROLOL TARTRATE 5 MG/5ML IV SOLN: 2.5000 mg | Freq: Four times a day (QID) | INTRAVENOUS | Status: DC | PRN

## 2021-10-11 NOTE — Consult Note (Addendum)
?Cardiology Consultation:  ? ?Patient ID: Luke [name] ?MRN: [id number]; DOB: April 13, 1933 ? ?Admit date: 10/08/2021 ?Date of Consult: 10/11/2021 ? ?PCP:  Aretta Nip, MD ?  ?[facility] Providers ?Cardiologist:  Larae Grooms, MD   { ? ?Patient Profile:  ? ?Luke [name] is a 88 y.o. male with a hx of CAD s/p CABG and PCI, chronic diastolic heart failure, chronic kidney disease stage III, hypertension, hyperlipidemia, diabetes mellitus, stroke, COPD with hypoxia and hiatal hernia who is being seen 10/11/2021 for the evaluation of low EF at the request of Dr. Verlon Au. ? ?Hx of CAD with MI/CABG in 9/99 at [facility], [place] with [place] to LAD, LIMA to OM, SVG to PDA; LAD drug eluting stent in 3/09 through the graft to the distal LAD. Last stress test 2013 showed mild ischemia in apical lateral region, EF 70%. ? ?Echocardiogram September 2022 with LV function of 65 to 70%, no regional wall motion abnormality.  ? ?Follows with Dr. Lamonte Sakai for COPD.  He has declined oxygen at home. ? ?Most recently seen by Dr. Irish Lack 10/07/2021.  He was having dyspnea on exertion and lower extremity edema.  His dyspnea on exertion felt multifactorial from deconditioning, COPD and volume overload.  He did not require any nitroglycerin.  He was not taking his furosemide over a month (since 08/2021 discharge for LL cellulitis) leading to lower extremity edema and weight gain.  Restarted Lasix 80 mg in the morning. ? ?History of Present Illness:  ? ?Mr. Saia presented to hospital after fell out of bed accidentally.  Lives with wife.  Initially refused EMS transport.  Later found hypoxic at 88% and brought to ER.  Had a large bruise on his head.  Admitted for acute hypoxic respiratory failure secondary to community-acquired pneumonia and decompensated CHF. ? ?He was given IV Lasix '40mg'$  x 1; '80mg'$  x 1 and then placed on 60 mg twice daily.  Net I & O -780 cc recorded. Lasix now stopped (already got AM dose this  morning) due to rising BUN /Scr 26/1.05>>24/1.07>>40/1.71.  ? ?Echo with newly reduced LVEF @ 35-40%, moderately reduce RV function.  ? ?The patient was agitated and restless.  He appears ill.  On restraint and has mittens.  No family at bedside.  Patient is a DNR. ? ?Echo 10/10/21 ? 1. Cannot fully assess wall motion. Left ventricular ejection fraction,  ?by estimation, is 35 to 40%. The left ventricle has moderately decreased  ?function. There is mild left ventricular hypertrophy of the basal segment.  ?Left ventricular diastolic  ?parameters are indeterminate.  ? 2. Right ventricular systolic function is moderately reduced. The right  ?ventricular size is not well visualized. There is normal pulmonary artery  ?systolic pressure.  ? 3. The mitral valve is grossly normal. No evidence of mitral valve  ?regurgitation.  ? 4. Aortic valve regurgitation is not visualized.  ? 5. The inferior vena cava is dilated in size with >50% respiratory  ?variability, suggesting right atrial pressure of 8 mmHg.  ? ?Conclusion(s)/Recommendation(s): Compared to prior study 04/11/2021, EF  ?more reduced.  ? ?Past Medical History:  ?Diagnosis Date  ? Anginal pain ([place]) 02/08/12  ? Arthritis   ? "all over"  ? CKD (chronic kidney disease), stage III ([place])   ? COPD (chronic obstructive pulmonary disease) ([place])   ? Coronary artery disease   ? a. MI/CABG in 9/99 at [facility], [place] with ([place] to LAD, LIMA to OM, SVG to PDA. b. LAD drug  eluting stent in 3/09 through the graft to the distal LAD.  ? Exertional dyspnea   ? "doesn't take much these days"  ? H/O hiatal hernia   ? Hyperlipemia   ? Hypertension   ? Hypothyroidism   ? Hypoxia   ? Stroke [facility]) 09/2010  ? denies residual  ? Type II diabetes mellitus ([place])   ? ? ?Past Surgical History:  ?Procedure Laterality Date  ? CATARACT EXTRACTION W/ INTRAOCULAR LENS  IMPLANT, BILATERAL  ~ 2007  ? COLONOSCOPY W/ POLYPECTOMY  09/2011  ? "removed 7"  ? CORONARY ANGIOPLASTY WITH STENT  PLACEMENT  ~ 2004  ? "1"  ? CORONARY ARTERY BYPASS GRAFT  1999  ? CABG X4  ? EYE SURGERY    ? RETINAL DETACHMENT SURGERY  10/2011  ? right eye  ? ? ?Inpatient Medications: ?Scheduled Meds: ? acetaZOLAMIDE ER  500 mg Oral Q12H  ? aspirin EC  81 mg Oral Daily  ? Chlorhexidine Gluconate Cloth  6 each Topical Q0600  ? enoxaparin (LOVENOX) injection  40 mg Subcutaneous Q24H  ? insulin aspart  0-6 Units Subcutaneous Q4H  ? insulin detemir  12 Units Subcutaneous Daily  ? mouth rinse  15 mL Mouth Rinse BID  ? metoprolol succinate  12.5 mg Oral Daily  ? umeclidinium-vilanterol  1 puff Inhalation Daily  ? ?Continuous Infusions: ? ?PRN Meds: ?albuterol, nitroGLYCERIN ? ?Allergies:   No Known Allergies ? ?Social History:   ?Social History  ? ?Socioeconomic History  ? Marital status: Married  ?  Spouse name: Not on file  ? Number of children: Not on file  ? Years of education: Not on file  ? Highest education level: Not on file  ?Occupational History  ? Not on file  ?Tobacco Use  ? Smoking status: Former  ?  Packs/day: 2.00  ?  Years: 35.00  ?  Pack years: 70.00  ?  Types: Cigarettes  ?  Quit date: 07/25/1993  ?  Years since quitting: 28.2  ? Smokeless tobacco: Former  ?  Quit date: 07/18/1995  ?Vaping Use  ? Vaping Use: Never used  ?Substance and Sexual Activity  ? Alcohol use: No  ?  Comment: 02/08/12 "stopped ~ 1995"  ? Drug use: No  ? Sexual activity: Never  ?Other Topics Concern  ? Not on file  ?Social History Narrative  ? Not on file  ? ?Social Determinants of Health  ? ?Financial Resource Strain: Not on file  ?Food Insecurity: Not on file  ?Transportation Needs: Not on file  ?Physical Activity: Not on file  ?Stress: Not on file  ?Social Connections: Not on file  ?Intimate Partner Violence: Not on file  ?  ?Family History:   ?Family History  ?Problem Relation Age of Onset  ? Heart attack Mother   ?  ? ?ROS:  ?Please see the history of present illness.  ?All other ROS reviewed and negative.    ? ?Physical Exam/Data:  ? ?Vitals:   ? 10/11/21 0530 10/11/21 0600 10/11/21 0700 10/11/21 0823  ?BP: 124/66 (!) 134/39 (!) 138/30   ?Pulse:  73 73   ?Resp:      ?Temp:      ?TempSrc:      ?SpO2:  99% 97% 97%  ?Weight:      ?Height:      ? ? ?Intake/Output Summary (Last 24 hours) at 10/11/2021 0829 ?Last data filed at 10/11/2021 0535 ?Gross per 24 hour  ?Intake 480 ml  ?Output 1050 ml  ?Net -  570 ml  ? ?Last 3 Weights 09/25/2021 10/07/2021 08/27/2021  ?Weight (lbs) 210 lb 8.6 oz 223 lb 210 lb  ?Weight (kg) 95.5 kg 101.152 kg 95.255 kg  ?   ?Body mass index is 29.36 kg/m?.  ?General: Elderly ill-appearing male with restraint ?HEENT: normal ?Neck: no JVD ?Vascular: No carotid bruits; Distal pulses 2+ bilaterally ?Cardiac:  normal S1, S2; irregular; no murmur  ?Lungs: Diminished breath sounds, limited exam  ?abd: soft, nontender, no hepatomegaly  ?Ext: trace edema ?Musculoskeletal:  No deformities, BUE and BLE strength normal and equal ?Skin: warm and dry  ?Neuro:   no focal abnormalities noted ?Psych: Lethargic, drowsy ? ?EKG:  The EKG was personally reviewed and demonstrates: Sinus rhythm, nonspecific ST/T wave abnormality, prolonged PR interval ?Telemetry:  Telemetry was personally reviewed and demonstrates: Sinus rhythm, frequent PVC, ventricular bigeminy and trigeminy ? ?Relevant CV Studies: ? ?Echo 04/11/2021 ? 1. Left ventricular ejection fraction, by estimation, is 65 to 70%. The  ?left ventricle has normal function. The left ventricle has no regional  ?wall motion abnormalities. Indeterminate diastolic filling due to E-A  ?fusion.  ? 2. Right ventricular systolic function is normal. The right ventricular  ?size is normal. Tricuspid regurgitation signal is inadequate for assessing  ?PA pressure.  ? 3. The mitral valve is grossly normal. No evidence of mitral valve  ?regurgitation. No evidence of mitral stenosis.  ? 4. The aortic valve is tricuspid. There is mild calcification of the  ?aortic valve. Aortic valve regurgitation is not visualized. Mild aortic   ?valve sclerosis is present, with no evidence of aortic valve stenosis.  ? 5. The inferior vena cava is normal in size with greater than 50%  ?respiratory variability, suggesting right atrial pressure of 3

## 2021-10-11 NOTE — Plan of Care (Signed)
?  Problem: Education: ?Goal: Knowledge of disease or condition will improve ?Outcome: Not Progressing ?  ?Problem: Activity: ?Goal: Ability to tolerate increased activity will improve ?Outcome: Not Progressing ?  ?Problem: Respiratory: ?Goal: Levels of oxygenation will improve ?Outcome: Progressing ?  ?

## 2021-10-11 NOTE — Progress Notes (Addendum)
?PROGRESS NOTE ? ? ?Luke [name]  FWY:[id number] DOB: 11/16/1932 DOA: 10/02/2021 ?PCP: Aretta Nip, MD  ?Brief Narrative:  ? ?11 community dwelling white male ?CAD CABG 1999 K LAD with stent ?HFpEF ?DM TY 2 with prior hypoglycemic issues on prior admission in 2022 ?CKD 3B-baseline creatinine about 1.6 ?HTN HLD COPD, some mild cognitive impairment- ?Recent admit 2/3-08/30/18/2023 LL cellulitis Rx initially IV and then oral antibiotics--his Lasix was discontinued on discharge ?  ?Recent visit Dr. Irish Lack office 10/07/2021 Theressa Millard his note]-about 13 pound weight gain from last admission noted to go up from  ?210-223 lb ?At that office visit he was told to continue his Lasix at 80 mg in the morning and home health was ordered ?  ?Had also seen Dr. Lamonte Sakai recently and had declined the use of oxygen ?  ?Represented to the hospital with a fall and slight LOC-it appears that the patient fell out of bed accidentally-he lives alone with his wife-he refused EMS transport initially but then called his son in the middle of the night-found to be 88% on room air when worked up by EMS ?  ?He was also found to have a large bruise on the top of his head ? ?He has multifactorial acute resp failure--2/2 type 2 resp failure from Undiagnosed OSA and Acute Heart failure ?Cardiology formally consulted 3/20 ? ?Hospital-Problem based course ? ?Multifactorial acute respiratory failure with 2 components ?1]Decompensated CHF with acute type II respiratory failure secondary to probable undiagnosed CPAP- ?Hold Lasix 60 IV twice daily-Rise in creat ?low-dose Toprol 12.5 daily--place IV toprol for HR > 120 ?No foley [patient would pull out and harm himself] ?EF lower than usual [55-->45%]see below ?2]Probable undiagnosed OSA ?ABG performed 3/18 = CO2 of 69 ?Continue as best possible mandatory hs CPAP ?3] COPD component is small-continue albuterol Atrovent and oral Ellipta ?Abnormal EKG on admission ?Repeat EKG this a.m. 3/19 shows still some  J-point elevation ?Continue aspirin-he is not having active chest pain ?ECHO thisadmit decreased EF from Prior--Appreciate Cards input--no current plans for Cath ?LOC/fall accidentally out of bed ?Imaging of head 2/2 negative from admission ?Reorient as needed ?DM TY 2 ?Goal blood sugar is greater than 150 given 2/2 mentation issues below this level ?Changed to Levemir 6 units  ?Liberalize diet ?Moderate COPD with compensated respiratory alkalosis ?CO2 38-cont Diamox 1000 bid ?Repeat labs a.m. ?Patulous esophagus ?Liquid diet for now-await speech eval ? ?DVT prophylaxis: Lovenox ?Code Status: DNR confirmed on admission ?Family Communication: long discussion with Wille Glaser, son on phone ?We will get GOC--issues are complicated by his wife , whom he lives with-has severe dementia, and he is the care-giver for her ?Disposition:  ?Status is: Inpatient ?Remains inpatient appropriate because: Requires probable BiPAP and not ready to transfer to floor as he ?  ?Consultants:  ?None at this time ? ?Procedures: No ? ?Antimicrobials:  ? ?Subjective: ? ?mildly confused, no cp no fever ?Ectopy on monitors noted ?Nursing tells me was very confused overnight--needed im meds and restraints ? ?Objective: ?Vitals:  ? 10/11/21 0700 10/11/21 0800 10/11/21 0823 10/11/21 0900  ?BP: (!) 138/30 (!) 116/48  (!) 110/23  ?Pulse: 73 74  (!) 52  ?Resp:      ?Temp:      ?TempSrc:      ?SpO2: 97% 95% 97% 93%  ?Weight:      ?Height:      ? ? ?Intake/Output Summary (Last 24 hours) at 10/11/2021 1104 ?Last data filed at 10/11/2021 0535 ?Gross per 24 hour  ?  Intake 480 ml  ?Output 1050 ml  ?Net -570 ml  ? ? ?Filed Weights  ? 09/22/2021 2028  ?Weight: 95.5 kg  ? ? ?Examination: ? ?obese-looks somewhat more comfortable-on nasal cannula at this time ?Neck soft supple ?Chest relatively clear ?Abdomen obese nontender no rebound no guarding ?Neurologically intact ?Trace LE edema ?[place] intact good power ? ? ?Data Reviewed: personally reviewed  ? ?CBC ?   ?Component  Value Date/Time  ? WBC 4.6 10/11/2021 0258  ? RBC 4.26 10/11/2021 0258  ? HGB 12.8 (L) 10/11/2021 0258  ? HCT 41.2 10/11/2021 0258  ? PLT 117 (L) 10/11/2021 0258  ? MCV 96.7 10/11/2021 0258  ? MCH 30.0 10/11/2021 0258  ? MCHC 31.1 10/11/2021 0258  ? RDW 13.6 10/11/2021 0258  ? LYMPHSABS 0.3 (L) 10/11/2021 0258  ? MONOABS 0.4 10/11/2021 0258  ? EOSABS 0.1 10/11/2021 0258  ? BASOSABS 0.0 10/11/2021 0258  ? ?CMP Latest Ref Rng & Units 10/11/2021 10/10/2021 09/24/2021  ?Glucose 70 - 99 mg/dL 117(H) 147(H) 182(H)  ?BUN 8 - 23 mg/dL 40(H) 24(H) 26(H)  ?Creatinine 0.61 - 1.24 mg/dL 1.71(H) 1.07 1.05  ?Sodium 135 - 145 mmol/L 137 141 138  ?Potassium 3.5 - 5.1 mmol/L 4.1 3.9 4.2  ?Chloride 98 - 111 mmol/L 94(L) 96(L) 99  ?CO2 22 - 32 mmol/L 38(H) 37(H) 35(H)  ?Calcium 8.9 - 10.3 mg/dL 8.3(L) 8.8(L) 8.3(L)  ?Total Protein 6.5 - 8.1 g/dL 6.1(L) 6.8 6.3(L)  ?Total Bilirubin 0.3 - 1.2 mg/dL 0.8 0.8 0.7  ?Alkaline Phos 38 - 126 U/L 39 46 43  ?AST 15 - 41 U/L '21 19 18  '$ ?ALT 0 - 44 U/L '12 13 14  '$ ? ? ? ?Radiology Studies: ?DG Chest 2 View ? ?Result Date: 10/08/2021 ?CLINICAL DATA:  [place] out of bed last night. EXAM: CHEST - 2 VIEW COMPARISON:  Chest x-ray dated April 10, 2021. FINDINGS: The heart size and mediastinal contours are within normal limits. Prior CABG. Trace left pleural effusion. No consolidation or pneumothorax. No acute osseous abnormality. IMPRESSION: 1. Trace left pleural effusion. Electronically Signed   By: Titus Dubin M.D.   On: 09/23/2021 13:41  ? ?CT Head Wo Contrast ? ?Result Date: 09/28/2021 ?CLINICAL DATA:  Head trauma, moderate-severe; Neck trauma (Age >= 65y) EXAM: CT HEAD WITHOUT CONTRAST CT CERVICAL SPINE WITHOUT CONTRAST TECHNIQUE: Multidetector CT imaging of the head and cervical spine was performed following the standard protocol without intravenous contrast. Multiplanar CT image reconstructions of the cervical spine were also generated. RADIATION DOSE REDUCTION: This exam was performed according to  the departmental dose-optimization program which includes automated exposure control, adjustment of the mA and/or kV according to patient size and/or use of iterative reconstruction technique. COMPARISON:  04/10/2021. FINDINGS: CT HEAD FINDINGS Mildly motion limited study. Brain: No evidence of acute large vascular territory infarction, hemorrhage, hydrocephalus, extra-axial collection or mass lesion/mass effect. Remote right parietal infarct. Additional patchy white matter hypoattenuation, nonspecific but compatible with chronic microvascular disease. Cerebral atrophy. Vascular: Calcific intracranial atherosclerosis. No hyperdense vessel identified. Skull: No acute fracture. Sinuses/Orbits: Clear sinuses.  No acute orbital findings Other: No mastoid effusions. CT CERVICAL SPINE FINDINGS Alignment: No substantial sagittal subluxation. Broad levocurvature. Skull base and vertebrae: Vertebral body heights are maintained. No evidence of acute fracture. Soft tissues and spinal canal: No prevertebral fluid or swelling. No visible canal hematoma. Disc levels: Moderate multilevel degenerative disc disease. Multilevel facet uncovertebral hypertrophy with varying degrees of neural foraminal stenosis. Upper chest: Biapical pleuroparenchymal scarring. No consolidation in  the visualized lung apices. Layering left pleural effusion. Other: Calcific atherosclerosis, including at bilateral carotid bifurcations. IMPRESSION: CT head: 1. Motion limited without evidence of acute intracranial abnormality. 2. 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 CT cervical spine: 1. Motion limited without evidence of acute fracture or traumatic malalignment. 2. Multilevel degenerative change, detailed above. 3. Layering left pleural effusion. Electronically Signed   By: Margaretha Sheffield M.D.   On: 10/01/2021 14:08  ? ?CT Angio Chest PE W and/or Wo Contrast ? ?Result Date: 10/11/2021 ?CLINICAL DATA:  Pulmonary  embolism (PE) suspected, high prob Shortness of breath.  Fall from bed last night. EXAM: CT ANGIOGRAPHY CHEST WITH CONTRAST TECHNIQUE: Multidetector CT imaging of the chest was performed using the standard proto

## 2021-10-11 NOTE — Progress Notes (Signed)
Continues to be agitated/restless/yelling out. Frequent ectopy/pvc. NP notified ?

## 2021-10-12 DIAGNOSIS — Z515 Encounter for palliative care: Secondary | ICD-10-CM | POA: Diagnosis not present

## 2021-10-12 DIAGNOSIS — I5021 Acute systolic (congestive) heart failure: Secondary | ICD-10-CM | POA: Diagnosis not present

## 2021-10-12 DIAGNOSIS — Z7189 Other specified counseling: Secondary | ICD-10-CM | POA: Diagnosis not present

## 2021-10-12 LAB — BASIC METABOLIC PANEL
Anion gap: 7 (ref 5–15)
BUN: 54 mg/dL — ABNORMAL HIGH (ref 8–23)
CO2: 36 mmol/L — ABNORMAL HIGH (ref 22–32)
Calcium: 8 mg/dL — ABNORMAL LOW (ref 8.9–10.3)
Chloride: 93 mmol/L — ABNORMAL LOW (ref 98–111)
Creatinine, Ser: 1.96 mg/dL — ABNORMAL HIGH (ref 0.61–1.24)
GFR, Estimated: 33 mL/min — ABNORMAL LOW (ref >60)
Glucose, Bld: 156 mg/dL — ABNORMAL HIGH (ref 70–99)
Potassium: 3.9 mmol/L (ref 3.5–5.1)
Sodium: 136 mmol/L (ref 135–145)

## 2021-10-12 LAB — GLUCOSE, CAPILLARY
Glucose-Capillary: 137 mg/dL — ABNORMAL HIGH (ref 70–99)
Glucose-Capillary: 137 mg/dL — ABNORMAL HIGH (ref 70–99)
Glucose-Capillary: 148 mg/dL — ABNORMAL HIGH (ref 70–99)
Glucose-Capillary: 156 mg/dL — ABNORMAL HIGH (ref 70–99)
Glucose-Capillary: 177 mg/dL — ABNORMAL HIGH (ref 70–99)
Glucose-Capillary: 180 mg/dL — ABNORMAL HIGH (ref 70–99)
Glucose-Capillary: 206 mg/dL — ABNORMAL HIGH (ref 70–99)

## 2021-10-12 MED ORDER — METOPROLOL SUCCINATE ER 25 MG PO TB24
25.0000 mg | ORAL_TABLET | Freq: Every day | ORAL | Status: DC
  Administered 2021-10-13 – 2021-10-18 (×5): 25 mg via ORAL
  Filled 2021-10-12 (×6): qty 1

## 2021-10-12 MED ORDER — METOPROLOL SUCCINATE ER 25 MG PO TB24
12.5000 mg | ORAL_TABLET | Freq: Once | ORAL | Status: AC
  Administered 2021-10-12: 12.5 mg via ORAL
  Filled 2021-10-12: qty 1

## 2021-10-12 NOTE — Progress Notes (Signed)
?PROGRESS NOTE ? ? ?Luke [name]  IZT:[id number] DOB: 09-27-1932 DOA: 09/30/2021 ?PCP: Aretta Nip, MD  ?Brief Narrative:  ? ?59 community dwelling white male ?CAD CABG 1999 K LAD with stent ?HFpEF ?DM TY 2 with prior hypoglycemic issues on prior admission in 2022 ?CKD 3B-baseline creatinine about 1.6 ?HTN HLD COPD, some mild cognitive impairment- ?Recent admit 2/3-08/30/18/2023 LL cellulitis Rx initially IV and then oral antibiotics--his Lasix was discontinued on discharge ?  ?Recent visit Dr. Irish Lack office 10/07/2021 Theressa Millard his note]-about 13 pound weight gain from last admission noted to go up from  ?210-223 lb ?At that office visit he was told to continue his Lasix at 80 mg in the morning and home health was ordered ?  ?Had also seen Dr. Lamonte Sakai recently and had declined the use of oxygen ?  ?Represented to the hospital with a fall and slight LOC-it appears that the patient fell out of bed accidentally-he lives alone with his wife-he refused EMS transport initially but then called his son in the middle of the night-found to be 88% on room air when worked up by EMS ?  ?He was also found to have a large bruise on the top of his head ? ?He has multifactorial acute resp failure--2/2 type 2 resp failure from Undiagnosed OSA and Acute Heart failure ?Cardiology formally consulted 3/20 ? ?Hospital-Problem based course ? ?Hopsital acquired delirium ?Has had mild delirium since 3/20 where he was agitated ?Is more orientable today and cooperative--suspect CO@ narcosis and hypoglycemia are main drivers ?Expect will clear slowly--Mandatory Vppa at night--Son has allied with us--will encourage patient to attempt the same ?Multifactorial acute respiratory failure with 2 components ?1]Decompensated CHF with acute type II respiratory failure secondary to probable undiagnosed CPAP- ?Hold Lasix 60 IV twice daily-^^^ in creat ?low-dose Toprol 12.5 daily-->25 per Cardiology 3/21 ?No foley [patient would pull out and harm  himself] ?EF lower than usual [55-->45%]see below ?2]Probable undiagnosed OSA ?ABG performed 3/18 = CO2 of 69 ?Continue as best possible mandatory hs CPAP and when sleeping ?3] COPD component is small ?continue albuterol Atrovent and oral Ellipta ?Abnormal EKG on admission ?EKG  3/19 shows still some J-point elevation ?Continue aspirin-he is not having active chest pain ?ECHO this admit decreased EF from --no current plans for Cath ?LOC/fall accidentally out of bed ?Bruise ot top of head ?Imaging of head 2/2 negative from admission ?Reorient as needed ?DM TY 2 ?Goal blood sugar is greater than 150 given 2/2 mentation issues below this level ?   ? Levemir 6 units Liberalize diet ?Moderate COPD with compensated respiratory alkalosis ?CO2 36-cont Diamox 1000 bid ?Repeat labs a.m. ?Patulous esophagus ?Cleared for reg solids thin liquids by SLP ? ?DVT prophylaxis: Lovenox ?Code Status: DNR confirmed on admission ?Family Communication: long discussion with Wille Glaser, son on phone ?Palliative has seen ?Care confounded by patient being the care-giver for his wife who has mod dementia ?TOC is working on a consolidated plan for patient ?Disposition:  ?Status is: Inpatient ?Remains inpatient appropriate because: Requires probable BiPAP and not ready to transfer to floor as he ?  ?Consultants:  ?None at this time ? ?Procedures: No ? ?Antimicrobials:  ? ?Subjective: ? ?Confused-thinks he is in [place] ?Cannot tell date year ?This isnt his norm-per son ?Walked only 10 ft today and tired out quickly with drop in sats and hypotension--this has improved some ? ?Objective: ?Vitals:  ? 10/12/21 1000 10/12/21 1004 10/12/21 1100 10/12/21 1200  ?BP:  113/68 (!) 121/47   ?Pulse: 81  83 79 72  ?Resp: 20  (!) 23 (!) 21  ?Temp:    98.1 ?F (36.7 ?C)  ?TempSrc:    Axillary  ?SpO2: 98%  96% 97%  ?Weight:      ?Height:      ? ? ?Intake/Output Summary (Last 24 hours) at 10/12/2021 1431 ?Last data filed at 10/12/2021 0600 ?Gross per 24 hour  ?Intake 300  ml  ?Output 300 ml  ?Net 0 ml  ? ? ?Filed Weights  ? 10/05/2021 2028 10/12/21 0643  ?Weight: 95.5 kg 95.1 kg  ? ? ?Examination: ? ?obese-looks comfortable-on nasal cannula at this time ?Neck soft supple ?Chest relatively clear ?Abdomen obese nontender no rebound no guarding ?Neurologically intact ?Trace LE edema ?Rom intact good power ? ? ?Data Reviewed: personally reviewed  ? ?CBC ?   ?Component Value Date/Time  ? WBC 4.6 10/11/2021 0258  ? RBC 4.26 10/11/2021 0258  ? HGB 12.8 (L) 10/11/2021 0258  ? HCT 41.2 10/11/2021 0258  ? PLT 117 (L) 10/11/2021 0258  ? MCV 96.7 10/11/2021 0258  ? MCH 30.0 10/11/2021 0258  ? MCHC 31.1 10/11/2021 0258  ? RDW 13.6 10/11/2021 0258  ? LYMPHSABS 0.3 (L) 10/11/2021 0258  ? MONOABS 0.4 10/11/2021 0258  ? EOSABS 0.1 10/11/2021 0258  ? BASOSABS 0.0 10/11/2021 0258  ? ?CMP Latest Ref Rng & Units 10/12/2021 10/11/2021 10/10/2021  ?Glucose 70 - 99 mg/dL 156(H) 117(H) 147(H)  ?BUN 8 - 23 mg/dL 54(H) 40(H) 24(H)  ?Creatinine 0.61 - 1.24 mg/dL 1.96(H) 1.71(H) 1.07  ?Sodium 135 - 145 mmol/L 136 137 141  ?Potassium 3.5 - 5.1 mmol/L 3.9 4.1 3.9  ?Chloride 98 - 111 mmol/L 93(L) 94(L) 96(L)  ?CO2 22 - 32 mmol/L 36(H) 38(H) 37(H)  ?Calcium 8.9 - 10.3 mg/dL 8.0(L) 8.3(L) 8.8(L)  ?Total Protein 6.5 - 8.1 g/dL - 6.1(L) 6.8  ?Total Bilirubin 0.3 - 1.2 mg/dL - 0.8 0.8  ?Alkaline Phos 38 - 126 U/L - 39 46  ?AST 15 - 41 U/L - 21 19  ?ALT 0 - 44 U/L - 12 13  ? ? ? ?Radiology Studies: ?No results found. ? ? ?Scheduled Meds: ? acetaZOLAMIDE ER  1,000 mg Oral Q12H  ? aspirin EC  81 mg Oral Daily  ? Chlorhexidine Gluconate Cloth  6 each Topical Q0600  ? enoxaparin (LOVENOX) injection  40 mg Subcutaneous Q24H  ? insulin detemir  6 Units Subcutaneous Daily  ? mouth rinse  15 mL Mouth Rinse BID  ? [START ON 10/13/2021] metoprolol succinate  25 mg Oral Daily  ? mupirocin ointment  1 application. Nasal BID  ? umeclidinium-vilanterol  1 puff Inhalation Daily  ? ?Continuous Infusions: ? ? LOS: 3 days  ? ?Time spent: 27  minutes ? ?Nita Sells, MD ?[facility] ?To contact the attending provider between 7A-7P or the covering provider during after hours 7P-7A, please log into the web site [email or web address] and access using universal [place] password for that web site. If you do not have the password, please call the hospital operator. ? ?10/12/2021, 2:31 PM  ? ? ?

## 2021-10-12 NOTE — Consult Note (Signed)
? ?                                                                                ?Consultation Note ?Date: 10/12/2021  ? ?Patient Name: Luke [name]  ?DOB: 1933-04-11  MRN: [id number]  Age / Sex: 88 y.o., male  ?PCP: Aretta Nip, MD ?Referring Physician: Nita Sells, MD ? ?Reason for Consultation: Establishing goals of care ? ?HPI/Patient Profile: 88 y.o. male  with past medical history of mild cognitive impairment, CAD, CABG 1999, HFpEF, HTN, HLD, COPD, diabetes, CKD stage 3b admitted on 10/15/2021 from home where he lives with his wife with fall with loss of consciousness due to heart failure exacerbation, likely underlying OSA, underlying COPD. Unfortunately it seems that he had known fluid overload with prescription for Lasix but was noted to not be taking Lasix and also noted to have previously refused oxygen from pulmonary.  ? ?Clinical Assessment and Goals of Care: ?I met today with Mr. Yepes with wife Opal Sidles Aida Raider) and son Wille Glaser at bedside. Mr. Gastelum is awake and conversant. He is very confused and forgetful. He answers one question and then will repeat that answer to the following questions. He is unaware that he is in the hospital. Lonna Duval he is in [place]. He has no complaints. Appetite is good and able to feed himself.  ? ?I spoke more so with son Wille Glaser. Wille Glaser reports that he has been checking in with his parents but they live alone. Mr. Purdum is actually caregiver to his wife with dementia. They have both been functional although she needs reminders to eat and guidance. Mr. Midgley is much more confused than his baseline. Jon and I discussed heart failure and concern for kidney disease limiting Lasix. We discussed the balance of diuresis with kidney disease and function. Wille Glaser understands that both his parents need much more assistance and knows that their health issues will only continue to become more challenging into the future. His main concern is for their safety and  care. We discussed potential of ALF where they can continue to live together but with more assistance and care. Wille Glaser travels for work at times and not able to be with them as much as he would like. He has been making visits to ALF. He has concerns about finances for ALF and I recommended that he speak with elder law attorney to assist with assets and how to manage this. I encouraged him to continue to make visits to ALF to determine a facility that may be appealing and have an opening. We discussed that this takes time and unlikely to be done prior to discharge. PT in for eval and we discussed that a short term rehab may be recommended but dependent on therapy evaluation and recommendations.  ? ?Wille Glaser does note that his parents have completed [facility] and DNR is in place for Mr. Leiker. Wille Glaser is hopeful that his father will continue to show improvement from acute illness but understands that he is unlikely to return to previous baseline and that the future is still unknown. His main concern is their safety and getting them both somewhere they can have more help and care. I will  have TOC get involved and provide him with any further information and list of local ALFs. Encouraged him to continue to research care facilities and options to financially support his parents to be in a facility.  ? ?Primary Decision Maker ?NEXT OF KIN Jon (patient confused, wife has dementia) ?  ? ?SUMMARY OF RECOMMENDATIONS   ?- DNR established ?- Time for outcomes ?- Will benefit from facility placement and ongoing palliative conversations ?- Outpatient palliative referral anticipated ? ?Code Status/Advance Care Planning: ?DNR ? ? ?Symptom Management:  ?Per attending, cardiology ? ?Palliative Prophylaxis:  ?Bowel Regimen and Delirium Protocol ? ?Psycho-social/Spiritual:  ?Desire for further Chaplaincy support:yes ?Additional Recommendations: Caregiving  Support/Resources and Referral to [employer]  ? ?Prognosis:  ?Overall  prognosis guarded with anticipation of more frequent challenges and complications and overall gradual decline.  ? ?Discharge Planning: To Be Determined  ? ?  ? ?Primary Diagnoses: ?Present on Admission: ? Acute clinical systolic heart failure ([place]) ? ? ?I have reviewed the medical record, interviewed the patient and family, and examined the patient. The following aspects are pertinent. ? ?Past Medical History:  ?Diagnosis Date  ? Anginal pain ([place]) 02/08/12  ? Arthritis   ? "all over"  ? CKD (chronic kidney disease), stage III ([place])   ? COPD (chronic obstructive pulmonary disease) ([place])   ? Coronary artery disease   ? a. MI/CABG in 9/99 at [facility], [place] with (RIMA to LAD, LIMA to OM, SVG to PDA. b. LAD drug eluting stent in 3/09 through the graft to the distal LAD.  ? Exertional dyspnea   ? "doesn't take much these days"  ? H/O hiatal hernia   ? Hyperlipemia   ? Hypertension   ? Hypothyroidism   ? Hypoxia   ? Stroke [facility]) 09/2010  ? denies residual  ? Type II diabetes mellitus ([place])   ? ?Social History  ? ?Socioeconomic History  ? Marital status: Married  ?  Spouse name: Not on file  ? Number of children: Not on file  ? Years of education: Not on file  ? Highest education level: Not on file  ?Occupational History  ? Not on file  ?Tobacco Use  ? Smoking status: Former  ?  Packs/day: 2.00  ?  Years: 35.00  ?  Pack years: 70.00  ?  Types: Cigarettes  ?  Quit date: 07/25/1993  ?  Years since quitting: 28.2  ? Smokeless tobacco: Former  ?  Quit date: 07/18/1995  ?Vaping Use  ? Vaping Use: Never used  ?Substance and Sexual Activity  ? Alcohol use: No  ?  Comment: 02/08/12 "stopped ~ 1995"  ? Drug use: No  ? Sexual activity: Never  ?Other Topics Concern  ? Not on file  ?Social History Narrative  ? Not on file  ? ?Social Determinants of Health  ? ?Financial Resource Strain: Not on file  ?Food Insecurity: Not on file  ?Transportation Needs: Not on file  ?Physical Activity: Not on file  ?Stress: Not on file   ?Social Connections: Not on file  ? ?Family History  ?Problem Relation Age of Onset  ? Heart attack Mother   ? ?Scheduled Meds: ? acetaZOLAMIDE ER  1,000 mg Oral Q12H  ? aspirin EC  81 mg Oral Daily  ? Chlorhexidine Gluconate Cloth  6 each Topical Q0600  ? enoxaparin (LOVENOX) injection  40 mg Subcutaneous Q24H  ? insulin detemir  6 Units Subcutaneous Daily  ? mouth rinse  15 mL Mouth Rinse BID  ?  metoprolol succinate  12.5 mg Oral Daily  ? mupirocin ointment  1 application. Nasal BID  ? umeclidinium-vilanterol  1 puff Inhalation Daily  ? ?Continuous Infusions: ?PRN Meds:.albuterol, metoprolol tartrate, nitroGLYCERIN ?No Known Allergies ?Review of Systems  ?Unable to perform ROS: Other  ?Constitutional:   ?     Confused - "I feel fine"  ? ?Physical Exam ?Vitals and nursing note reviewed.  ?Constitutional:   ?   General: He is not in acute distress. ?Cardiovascular:  ?   Rate and Rhythm: Normal rate.  ?   Comments: Frequent PVCs ?Pulmonary:  ?   Effort: No tachypnea, accessory muscle usage or respiratory distress.  ?Neurological:  ?   Mental Status: He is alert. He is confused.  ?   Comments: Oriented to person only; knows and able to name his family at bedside  ? ? ?Vital Signs: BP (!) 111/40 (BP Location: Left Arm)   Pulse 67   Temp 98.9 ?F (37.2 ?C) (Oral)   Resp 16   Ht _0  (1.803 m)   Wt 95.1 kg   SpO2 97%   BMI 29.24 kg/m?  ?Pain Scale: 0-10 ?  ?Pain Score: Asleep ? ? ?SpO2: SpO2: 97 % ?O2 Device:SpO2: 97 % ?O2 Flow Rate: .O2 Flow Rate (L/min): 3 L/min ? ?IO: Intake/output summary:  ?Intake/Output Summary (Last 24 hours) at 10/12/2021 0916 ?Last data filed at 10/12/2021 0600 ?Gross per 24 hour  ?Intake 600 ml  ?Output 500 ml  ?Net 100 ml  ? ? ?LBM: Last BM Date : 10/10/21 ?Baseline Weight: Weight: 95.5 kg ?Most recent weight: Weight: 95.1 kg     ?Palliative Assessment/Data: ? ? ? ? ?Time Total: 80 min ? ?Greater than 50%  of this time was spent counseling and coordinating care related to the above  assessment and plan. ? ?Signed by: ?Vinie Sill, NP ?Palliative Medicine Team ?Pager # [phone] (M-F 8a-5p) ?Team Phone # [phone] (Nights/Weekends) ?  ? ? ? ? ? ? ? ? ? ? ? ? ?  ?

## 2021-10-12 NOTE — Progress Notes (Signed)
Physical Therapy Treatment ?Patient Details ?Name: Luke [name] ?MRN: [id number] ?DOB: 20-Jun-1933 ?Today's Date: 10/12/2021 ? ? ?History of Present Illness This 88 years old male with PMH significant for COPD, CAD, hypertension, hyperlipidemia, CKD stage IIIb, hypothyroidism, diabetes mellitus type 2 presented in the ED with complaints of fall at home with head wound and dx with acute decompensated HFpEF and acute repiratory failure 2* heart failure ? ?  ?PT Comments  ? ? Pt seen in ICU room # 1229 ?Spouse and Son present.  General Comments: AxO x 1.5 pleasant but poor recall recent situation.  Following directions.  Feels "weak".  Assisted OOB required increased time and + 2 assist.  General transfer comment: 50% VC's on proper hand placement and increased time to rise. General Gait Details: limited distance of 12 feet due to desats in upper 70"s.  Started at 3 lts at rest 96%.  Increased to 4 lts sats low 80's.  Increased coughing with audible upper congestion.  BP's are soft.  HR avg 83.    Recliner following for safety. Positioned in recliner to comfort.    ?Recommendations for follow up therapy are one component of a multi-disciplinary discharge planning process, led by the attending physician.  Recommendations may be updated based on patient status, additional functional criteria and insurance authorization. ? ?Follow Up Recommendations ? Skilled nursing-short term rehab (<3 hours/day) ?  ?  ?Assistance Recommended at Discharge Frequent or constant Supervision/Assistance  ?Patient can return home with the following A lot of help with walking and/or transfers;A little help with bathing/dressing/bathroom;Assistance with cooking/housework;Assist for transportation;Help with stairs or ramp for entrance ?  ?Equipment Recommendations ? None recommended by PT  ?  ?Recommendations for Other Services   ? ? ?  ?Precautions / Restrictions Precautions ?Precautions: Fall ?Precaution Comments: monitor  vitals ?Restrictions ?Weight Bearing Restrictions: No  ?  ? ?Mobility ? Bed Mobility ?Overal bed mobility: Needs Assistance ?Bed Mobility: Supine to Sit ?  ?  ?Supine to sit: Supervision, Min guard ?  ?  ?General bed mobility comments: good initiation but easily fatigues ?  ? ?Transfers ?Overall transfer level: Needs assistance ?Equipment used: Rolling walker (2 wheels) ?Transfers: Sit to/from Stand ?Sit to Stand: Mod assist, +2 safety/equipment, +2 physical assistance ?  ?  ?  ?  ?  ?General transfer comment: 50% VC's on proper hand placement and increased time to rise. ?  ? ?Ambulation/Gait ?Ambulation/Gait assistance: Mod assist, +2 physical assistance, +2 safety/equipment ?Gait Distance (Feet): 12 Feet ?Assistive device: Rolling walker (2 wheels) ?Gait Pattern/deviations: Step-to pattern, Step-through pattern, Decreased step length - right, Decreased step length - left, Shuffle, Trunk flexed ?Gait velocity: decreased ?  ?  ?General Gait Details: limited distance of 12 feet due to desats in upper 70"s.  Started at 3 lts at rest 96%.  Increased to 4 lts sats low 80's.  Increased coughing with audible upper congestion.  BP's are soft.  HR avg 83.    Recliner following for safety. ? ? ?Stairs ?  ?  ?  ?  ?  ? ? ?Wheelchair Mobility ?  ? ?Modified Rankin (Stroke Patients Only) ?  ? ? ?  ?Balance   ?  ?  ?  ?  ?  ?  ?  ?  ?  ?  ?  ?  ?  ?  ?  ?  ?  ?  ?  ? ?  ?Cognition Arousal/Alertness: Awake/alert, Lethargic (50/50 groggy/tired) ?  ?Overall Cognitive Status: History of  cognitive impairments - at baseline ?  ?  ?  ?  ?  ?  ?  ?  ?  ?  ?  ?  ?  ?  ?  ?  ?General Comments: AxO x 1.5 pleasant but poor recall recent situation ?  ?  ? ?  ?Exercises   ? ?  ?General Comments   ?  ?  ? ?Pertinent Vitals/Pain Pain Assessment ?Pain Assessment: No/denies pain  ? ? ?Home Living   ?  ?  ?  ?  ?  ?  ?  ?  ?  ?   ?  ?Prior Function    ?  ?  ?   ? ?PT Goals (current goals can now be found in the care plan section) Progress towards  PT goals: Progressing toward goals ? ?  ?Frequency ? ? ? Min 3X/week ? ? ? ?  ?PT Plan Current plan remains appropriate  ? ? ?Co-evaluation   ?  ?  ?  ?  ? ?  ?AM-PAC PT "6 Clicks" Mobility   ?Outcome Measure ? Help needed turning from your back to your side while in a flat bed without using bedrails?: A Little ?Help needed moving from lying on your back to sitting on the side of a flat bed without using bedrails?: A Little ?Help needed moving to and from a bed to a chair (including a wheelchair)?: A Lot ?Help needed standing up from a chair using your arms (e.g., wheelchair or bedside chair)?: A Lot ?Help needed to walk in hospital room?: A Lot ?Help needed climbing 3-5 steps with a railing? : Total ?6 Click Score: 13 ? ?  ?End of Session Equipment Utilized During Treatment: Gait belt;Oxygen ?Activity Tolerance: Patient limited by fatigue ?Patient left: in chair;with call bell/phone within reach;with chair alarm set ?Nurse Communication: Mobility status ?PT Visit Diagnosis: Unsteadiness on feet (R26.81);Muscle weakness (generalized) (M62.81);Difficulty in walking, not elsewhere classified (R26.2);History of falling (Z91.81) ?  ? ? ?Time: 1305-1330 ?PT Time Calculation (min) (ACUTE ONLY): 25 min ? ?Charges:  $Gait Training: 8-22 mins ?$Therapeutic Activity: 8-22 mins          ?          ? ?{Luke [name]  PTA ?Acute  Rehabilitation Services ?Pager      [phone] ?Office      [phone] ? ? ? ?

## 2021-10-12 NOTE — Progress Notes (Addendum)
? ?Progress Note ? ?Patient Name: Luke [name] ?Date of Encounter: 10/12/2021 ? ?[facility] Cardiologist: Larae Grooms, MD  ? ?Subjective  ? ?Breathing better. Off restrains. No chest pain.  ? ?Inpatient Medications  ?  ?Scheduled Meds: ? acetaZOLAMIDE ER  1,000 mg Oral Q12H  ? aspirin EC  81 mg Oral Daily  ? Chlorhexidine Gluconate Cloth  6 each Topical Q0600  ? enoxaparin (LOVENOX) injection  40 mg Subcutaneous Q24H  ? insulin detemir  6 Units Subcutaneous Daily  ? mouth rinse  15 mL Mouth Rinse BID  ? metoprolol succinate  12.5 mg Oral Daily  ? mupirocin ointment  1 application. Nasal BID  ? umeclidinium-vilanterol  1 puff Inhalation Daily  ? ?Continuous Infusions: ? ?PRN Meds: ?albuterol, metoprolol tartrate, nitroGLYCERIN  ? ?Vital Signs  ?  ?Vitals:  ? 10/12/21 0400 10/12/21 0500 10/12/21 5035 10/12/21 0738  ?BP: (!) 115/37 (!) 143/55 (!) 111/40   ?Pulse: 69 70 69 67  ?Resp: '18 20 14 16  '$ ?Temp:      ?TempSrc:      ?SpO2: 95% 94% 96% 97%  ?Weight:   95.1 kg   ?Height:      ? ? ?Intake/Output Summary (Last 24 hours) at 10/12/2021 0957 ?Last data filed at 10/12/2021 0600 ?Gross per 24 hour  ?Intake 600 ml  ?Output 500 ml  ?Net 100 ml  ? ?Last 3 Weights 10/12/2021 10/01/2021 10/07/2021  ?Weight (lbs) 209 lb 10.5 oz 210 lb 8.6 oz 223 lb  ?Weight (kg) 95.1 kg 95.5 kg 101.152 kg  ?   ? ?Telemetry  ?  ?Sinus rhyhtm, PVCs - Personally Reviewed ? ?ECG  ?  ?N/A ? ?Physical Exam  ? ?GEN: Elderly, ill appearing male in no acute distress.   ?Neck: No JVD ?Cardiac: RRR, no murmurs, rubs, or gallops.  ?Respiratory: Clear to auscultation bilaterally. ?GI: Soft, nontender, non-distended  ?MS: No edema; No deformity. ?Neuro:  Nonfocal  ?Psych: Normal affect  ? ?Labs  ?  ?High Sensitivity Troponin:   ?Recent Labs  ?Lab 10/08/2021 ?1624 10/12/2021 ?1908  ?TROPONINIHS 32* 33*  ?   ?Chemistry ?Recent Labs  ?Lab 10/22/2021 ?1256 10/10/21 ?0300 10/11/21 ?0258 10/12/21 ?0229  ?NA 138 141 137 136  ?K 4.2 3.9 4.1 3.9  ?CL 99 96* 94*  93*  ?CO2 35* 37* 38* 36*  ?GLUCOSE 182* 147* 117* 156*  ?BUN 26* 24* 40* 54*  ?CREATININE 1.05 1.07 1.71* 1.96*  ?CALCIUM 8.3* 8.8* 8.3* 8.0*  ?MG  --   --  2.2  --   ?PROT 6.3* 6.8 6.1*  --   ?ALBUMIN 3.8 4.0 3.7  --   ?AST '18 19 21  '$ --   ?ALT '14 13 12  '$ --   ?ALKPHOS 43 46 39  --   ?BILITOT 0.7 0.8 0.8  --   ?GFRNONAA >60 >60 39* 33*  ?ANIONGAP 4* '8 5 7  '$ ?  ?Lipids No results for input(s): CHOL, TRIG, HDL, LABVLDL, LDLCALC, CHOLHDL in the last 168 hours.  ?Hematology ?Recent Labs  ?Lab 10/13/2021 ?1256 10/10/21 ?0300 10/11/21 ?0258  ?WBC 5.5 5.2 4.6  ?RBC 4.39 4.62 4.26  ?HGB 13.4 14.0 12.8*  ?HCT 42.3 45.2 41.2  ?MCV 96.4 97.8 96.7  ?MCH 30.5 30.3 30.0  ?MCHC 31.7 31.0 31.1  ?RDW 14.0 14.0 13.6  ?PLT 131* 139* 117*  ? ?Thyroid No results for input(s): TSH, FREET4 in the last 168 hours.  ?BNP ?Recent Labs  ?Lab 09/28/2021 ?1257 10/10/21 ?0300  ?BNP  1,161.9* 1,077.4*  ?  ?DDimer No results for input(s): DDIMER in the last 168 hours.  ? ?Radiology  ?  ?No results found. ? ?Cardiac Studies  ? ?Echo 10/10/21 ? 1. Cannot fully assess wall motion. Left ventricular ejection fraction,  ?by estimation, is 35 to 40%. The left ventricle has moderately decreased  ?function. There is mild left ventricular hypertrophy of the basal segment.  ?Left ventricular diastolic  ?parameters are indeterminate.  ? 2. Right ventricular systolic function is moderately reduced. The right  ?ventricular size is not well visualized. There is normal pulmonary artery  ?systolic pressure.  ? 3. The mitral valve is grossly normal. No evidence of mitral valve  ?regurgitation.  ? 4. Aortic valve regurgitation is not visualized.  ? 5. The inferior vena cava is dilated in size with >50% respiratory  ?variability, suggesting right atrial pressure of 8 mmHg.  ? ?Conclusion(s)/Recommendation(s): Compared to prior study 04/11/2021, EF  ?more reduced.  ? ?Patient Profile  ?   ?88 y.o. male with a hx of CAD s/p CABG and PCI, chronic diastolic heart failure, chronic  kidney disease stage III, hypertension, hyperlipidemia, diabetes mellitus, stroke, COPD with hypoxia and hiatal hernia who is being seen 10/11/2021 for the evaluation of low EF at the request of Dr. Verlon Au. ? ?Assessment & Plan  ?  ?Acute systolic heart failure ?- Recent history of lower extremity edema and dyspnea on exertion.  His dyspnea felt multifactorial.  Lower extremity edema due to discontinuation of diuretics recently.  Seen by Dr. Irish Lack 3/16 and restarted Lasix (see note) ?- BNP 1161>>1077 ?-Now admitted for acute hypoxic respiratory failure secondary to decompensated heart failure and possible community-acquired pneumonia.  He was getting IV Lasix but worsening BUN/creatinine. - Last dose of IV Lasix 60 MG 3/20 @ 2353. ?- No grossly volume overload on exam. ?- He is currently not a candidate for ischemic evaluation.  Recommended discussing goals of care.  If he improves will certainly consider. No family at bedside. Still not fully at baseline. Patient is DNR.  ?- ? Due to PVCs mediated cardiomyopathy ?-Increase Toprol-XL to '25mg'$  daily ?-No ACE/ARB's/Entresto due to worsening renal function ?  ?2. CAD s/p CABG in 1999 and PCI in 2009 ?- No reported chest pain  ?- Continue ASA and BB ?- Consider resuming home statin  ?- EKG with nonspecific ST/T wave abnormality, no significant changes from prior ?  ?3. Acute on CKD III ? - rising BUN /Scr 26/1.05>>24/1.07>>40/1.71>>> today 54/1.96 ?- Avoid nephrotoxic agent ?- Could consider nephrology evaluation  ? ?4. PVCs ?- Increase BB as above  ? ?For questions or updates, please contact [place] ?Please consult [email or web address] for contact info under  ? ?  ?   ?Signed, ?Leanor Kail, PA  ?10/12/2021, 9:57 AM    ? ?Patient seen and examined with [facility] PA.  Agree as above, with the following exceptions and changes as noted below. Tolerating increase in Toprol XL  though PVCs have continued to be frequent, may be challenging to uptitrate further.  Volume status improved. Gen: NAD, CV: iRRR, no murmurs, Lungs: clear, Abd: soft, Extrem: Warm, well perfused, no edema, Neuro/Psych: alert and oriented x 3, normal mood and affect. All available labs, radiology testing, previous records reviewed. Worsening renal function after diuresis. BB increased to 25 mg daily today. Agree with goals of care, if aggressive measures requested can try to further uptitrate BB, however may impact BP which has at times been low. Limited options, does not seem  like an optimal candidate for interventions. Can discuss at follow up if desired. Hold lasix in setting of AKI. ? ?Elouise Munroe, MD ?10/12/21 1:00 PM ? ?

## 2021-10-13 ENCOUNTER — Inpatient Hospital Stay (HOSPITAL_COMMUNITY): Payer: Medicare Other

## 2021-10-13 DIAGNOSIS — I5021 Acute systolic (congestive) heart failure: Secondary | ICD-10-CM | POA: Diagnosis not present

## 2021-10-13 LAB — CBC WITH DIFFERENTIAL/PLATELET
Abs Immature Granulocytes: 0.01 10*3/uL (ref 0.00–0.07)
Basophils Absolute: 0 10*3/uL (ref 0.0–0.1)
Basophils Relative: 0 %
Eosinophils Absolute: 0.2 10*3/uL (ref 0.0–0.5)
Eosinophils Relative: 5 %
HCT: 41.3 % (ref 39.0–52.0)
Hemoglobin: 12.5 g/dL — ABNORMAL LOW (ref 13.0–17.0)
Immature Granulocytes: 0 %
Lymphocytes Relative: 20 %
Lymphs Abs: 0.9 10*3/uL (ref 0.7–4.0)
MCH: 30 pg (ref 26.0–34.0)
MCHC: 30.3 g/dL (ref 30.0–36.0)
MCV: 99.3 fL (ref 80.0–100.0)
Monocytes Absolute: 0.8 10*3/uL (ref 0.1–1.0)
Monocytes Relative: 17 %
Neutro Abs: 2.7 10*3/uL (ref 1.7–7.7)
Neutrophils Relative %: 58 %
Platelets: 114 10*3/uL — ABNORMAL LOW (ref 150–400)
RBC: 4.16 MIL/uL — ABNORMAL LOW (ref 4.22–5.81)
RDW: 13.3 % (ref 11.5–15.5)
WBC: 4.6 10*3/uL (ref 4.0–10.5)
nRBC: 0 % (ref 0.0–0.2)

## 2021-10-13 LAB — GLUCOSE, CAPILLARY
Glucose-Capillary: 148 mg/dL — ABNORMAL HIGH (ref 70–99)
Glucose-Capillary: 147 mg/dL — ABNORMAL HIGH (ref 70–99)
Glucose-Capillary: 193 mg/dL — ABNORMAL HIGH (ref 70–99)
Glucose-Capillary: 159 mg/dL — ABNORMAL HIGH (ref 70–99)
Glucose-Capillary: 161 mg/dL — ABNORMAL HIGH (ref 70–99)
Glucose-Capillary: 135 mg/dL — ABNORMAL HIGH (ref 70–99)

## 2021-10-13 LAB — COMPREHENSIVE METABOLIC PANEL
ALT: 15 U/L (ref 0–44)
AST: 22 U/L (ref 15–41)
Albumin: 3.6 g/dL (ref 3.5–5.0)
Alkaline Phosphatase: 46 U/L (ref 38–126)
Anion gap: 9 (ref 5–15)
BUN: 65 mg/dL — ABNORMAL HIGH (ref 8–23)
CO2: 34 mmol/L — ABNORMAL HIGH (ref 22–32)
Calcium: 8.1 mg/dL — ABNORMAL LOW (ref 8.9–10.3)
Chloride: 94 mmol/L — ABNORMAL LOW (ref 98–111)
Creatinine, Ser: 2.18 mg/dL — ABNORMAL HIGH (ref 0.61–1.24)
GFR, Estimated: 29 mL/min — ABNORMAL LOW (ref >60)
Glucose, Bld: 146 mg/dL — ABNORMAL HIGH (ref 70–99)
Potassium: 4 mmol/L (ref 3.5–5.1)
Sodium: 137 mmol/L (ref 135–145)
Total Bilirubin: 0.8 mg/dL (ref 0.3–1.2)
Total Protein: 6.2 g/dL — ABNORMAL LOW (ref 6.5–8.1)

## 2021-10-13 LAB — MAGNESIUM: Magnesium: 2.7 mg/dL — ABNORMAL HIGH (ref 1.7–2.4)

## 2021-10-13 LAB — CREATININE, URINE, RANDOM: Creatinine, Urine: 174.55 mg/dL

## 2021-10-13 MED ORDER — ACETAMINOPHEN 325 MG PO TABS
650.0000 mg | ORAL_TABLET | Freq: Once | ORAL | Status: AC
  Administered 2021-10-13: 650 mg via ORAL
  Filled 2021-10-13: qty 2

## 2021-10-13 MED ORDER — SODIUM CHLORIDE 0.9 % IV SOLN: INTRAVENOUS | Status: DC

## 2021-10-13 MED ORDER — SODIUM CHLORIDE 0.9 % IV SOLN
3.0000 g | Freq: Two times a day (BID) | INTRAVENOUS | Status: DC
  Administered 2021-10-13 – 2021-10-14 (×2): 3 g via INTRAVENOUS
  Filled 2021-10-13 (×2): qty 8

## 2021-10-13 MED ORDER — ENOXAPARIN SODIUM 30 MG/0.3ML IJ SOSY
30.0000 mg | PREFILLED_SYRINGE | INTRAMUSCULAR | Status: DC
  Administered 2021-10-13: 30 mg via SUBCUTANEOUS
  Filled 2021-10-13: qty 0.3

## 2021-10-13 MED ORDER — FOOD THICKENER (SIMPLYTHICK)
1.0000 | ORAL | Status: DC | PRN
  Filled 2021-10-13: qty 1

## 2021-10-13 NOTE — Progress Notes (Signed)
Patient c/o generalized pain and requested tylenol please. Notified J. Olena Heckle, NP. New order for tylenol 650 mg PO once. Will administer and continue to monitor patient. ?

## 2021-10-13 NOTE — TOC Initial Note (Signed)
Transition of Care (TOC) - Initial/Assessment Note  ? ? ?Patient Details  ?Name: Luke [name] ?MRN: [id number] ?Date of Birth: 1932/08/14 ? ?Transition of Care (TOC) CM/SW Contact:    ?Trish Mage, LCSW ?Phone Number: ?10/13/2021, 1:42 PM ? ?Clinical Narrative:    Patient seen in follow up to PT recommendation of SNF.  As Mr Luke [name] was confused and unable to carry on a meaningful conversation, I called son who confirmed that his father is caregiver for his mother who has dementia, and that he is in favor of SNF.  Bed search process explained and initiated. Mr Luke [name], son, is hopeful for [facility] as he has been looking into living situations for his parents, and that is one of them.  He wondered about ALF's, and I suggested he check into the [facility] system. TOC will continue to follow during the course of hospitalization. ?             ? ? ?Expected Discharge Plan: [place] ?Barriers to Discharge: SNF Pending bed offer ? ? ?Patient Goals and CMS Choice ?Patient states their goals for this hospitalization and ongoing recovery are:: return home ?CMS Medicare.gov Compare Post Acute Care list provided to:: Patient ?Choice offered to / list presented to : Adult Children ? ?Expected Discharge Plan and Services ?Expected Discharge Plan: [place] ?  ?Discharge Planning Services: CM Consult ?Post Acute Care Choice: [place] ?Living arrangements for the past 2 months: [place] ?                ?  ?  ?  ?  ?  ?  ?  ?  ?  ?  ? ?Prior Living Arrangements/Services ?Living arrangements for the past 2 months: [place] ?Lives with:: Spouse ?Patient language and need for interpreter reviewed:: Yes ?       ?Need for Family Participation in Patient Care: Yes (Comment) ?Care giver support system in place?: Yes (comment) ?  ?Criminal Activity/Legal Involvement Pertinent to Current Situation/Hospitalization: No - Comment as needed ? ?Activities of Daily  Living ?Home Assistive Devices/Equipment: [place]ford Rile (specify type) ?ADL Screening (condition at time of admission) ?Patient's cognitive ability adequate to safely complete daily activities?: No ?Is the patient deaf or have difficulty hearing?: Yes ?Does the patient have difficulty seeing, even when wearing glasses/contacts?: No ?Does the patient have difficulty concentrating, remembering, or making decisions?: Yes ?Patient able to express need for assistance with ADLs?: Yes ?Does the patient have difficulty dressing or bathing?: Yes ?Independently performs ADLs?: No ?Does the patient have difficulty walking or climbing stairs?: Yes ?Weakness of Legs: Both ?Weakness of Arms/Hands: Both ? ?Permission Sought/Granted ?Permission sought to share information with : Family Supports ?Permission granted to share information with : No ? Share Information with NAME: Luke [name] (Son)   [phone] ?   ?   ?   ? ?Emotional Assessment ?Appearance:: Appears stated age ?Attitude/Demeanor/Rapport: Unable to Assess ?Affect (typically observed): Unable to Assess ?Orientation: : Oriented to Self ?Alcohol / Substance Use: Not Applicable ?Psych Involvement: No (comment) ? ?Admission diagnosis:  Hypoxia [R09.02] ?Heart failure ([place]) [I50.9] ?Acute clinical systolic heart failure ([place]) [I50.21] ?Syncope, unspecified syncope type [R55] ?Patient Active Problem List  ? Diagnosis Date Noted  ? Acute clinical systolic heart failure ([place]) 10/07/2021  ? Heart failure ([place]) 10/14/2021  ? Recurrent cellulitis of lower leg 08/27/2021  ? Acute encephalopathy 04/10/2021  ? B12 deficiency 11/23/2020  ? Vitamin D deficiency 11/23/2020  ? Falls 11/23/2020  ?  Syncope and collapse 11/23/2020  ? Hypoglycemia 11/23/2020  ? COPD with acute exacerbation ([place]) 11/23/2020  ? COPD (chronic obstructive pulmonary disease) ([place]) 09/18/2017  ? Hypoxemia 09/18/2017  ? Primary hypothyroidism 09/19/2016  ? Stage 3a chronic kidney disease ([place]) 09/19/2016  ? Type 2  diabetes mellitus with hyperglycemia, with long-term current use of insulin ([place]) 09/19/2016  ? CRI (chronic renal insufficiency) 06/09/2014  ? Overweight 06/09/2014  ? Coronary atherosclerosis of native coronary artery 12/06/2013  ? Mixed hyperlipidemia 12/06/2013  ? Nonspecific abnormal results of cardiovascular function study 12/06/2013  ? Chronic diastolic heart failure ([place]) 12/06/2013  ? Essential hypertension, benign 12/06/2013  ? AKI (acute kidney injury) ([place]) 03/17/2013  ? N&V (nausea and vomiting) 03/17/2013  ? Diarrhea 03/17/2013  ? DM (diabetes mellitus) ([place]) 03/17/2013  ? Hypotension 03/17/2013  ? Dizziness 03/17/2013  ? ?PCP:  Aretta Nip, MD ?Pharmacy:   ?[id number] [place] [facility] [id number]?Phone: [phone]: [phone]? ? ? ? ?Social Determinants of Health (SDOH) Interventions ?  ? ?Readmission Risk Interventions ? ?  04/12/2021  ? 11:06 AM  ?Readmission Risk Prevention Plan  ?Transportation Screening Complete  ?PCP or Specialist Appt within 5-7 Days Complete  ?Home Care Screening Complete  ?Medication Review (RN CM) Complete  ? ? ? ?

## 2021-10-13 NOTE — Progress Notes (Signed)
? ?Progress Note ? ?Patient Name: Luke [name] ?Date of Encounter: 10/13/2021 ? ?[facility] Cardiologist: Larae Grooms, MD  ? ?Subjective  ? ?More somnolent today, family not at bedside. PVCs improved. ? ?Inpatient Medications  ?  ?Scheduled Meds: ? acetaZOLAMIDE ER  1,000 mg Oral Q12H  ? aspirin EC  81 mg Oral Daily  ? Chlorhexidine Gluconate Cloth  6 each Topical Q0600  ? enoxaparin (LOVENOX) injection  30 mg Subcutaneous Q24H  ? insulin detemir  6 Units Subcutaneous Daily  ? mouth rinse  15 mL Mouth Rinse BID  ? metoprolol succinate  25 mg Oral Daily  ? mupirocin ointment  1 application. Nasal BID  ? umeclidinium-vilanterol  1 puff Inhalation Daily  ? ?Continuous Infusions: ? sodium chloride Stopped (10/13/21 1831)  ? ampicillin-sulbactam (UNASYN) IV 200 mL/hr at 10/13/21 1832  ? ?PRN Meds: ?albuterol, food thickener, metoprolol tartrate, nitroGLYCERIN  ? ?Vital Signs  ?  ?Vitals:  ? 10/13/21 1604 10/13/21 1722 10/13/21 1800 10/13/21 1817  ?BP:  (!) 134/55    ?Pulse: (!) 122  75 76  ?Resp: '20  20 20  '$ ?Temp:      ?TempSrc:      ?SpO2: 95%  100% 97%  ?Weight:      ?Height:      ? ? ?Intake/Output Summary (Last 24 hours) at 10/13/2021 1845 ?Last data filed at 10/13/2021 1832 ?Gross per 24 hour  ?Intake 721.81 ml  ?Output 900 ml  ?Net -178.19 ml  ? ? ?  10/12/2021  ?  6:43 AM 10/01/2021  ?  8:28 PM 10/07/2021  ? 11:14 AM  ?Last 3 Weights  ?Weight (lbs) 209 lb 10.5 oz 210 lb 8.6 oz 223 lb  ?Weight (kg) 95.1 kg 95.5 kg 101.152 kg  ?   ? ?Telemetry  ?  ?Sinus rhyhtm, PVCs - Personally Reviewed ? ?ECG  ?  ?N/A ? ?Physical Exam  ? ?GEN: Elderly, ill appearing male in no acute distress.   ?Neck: No JVD ?Cardiac: iRRR, no murmurs, rubs, or gallops.  ?Respiratory: Clear to auscultation bilaterally. ?GI: Soft, nontender, non-distended  ?MS: trace edema; No deformity. ?Neuro:  Nonfocal ?Psych: Normal affect  ? ?Labs  ?  ?High Sensitivity Troponin:   ?Recent Labs  ?Lab 09/28/2021 ?1624 10/14/2021 ?1908  ?TROPONINIHS 32* 33*   ?   ?Chemistry ?Recent Labs  ?Lab 10/10/21 ?0300 10/11/21 ?0258 10/12/21 ?0229 10/13/21 ?0304  ?NA 141 137 136 137  ?K 3.9 4.1 3.9 4.0  ?CL 96* 94* 93* 94*  ?CO2 37* 38* 36* 34*  ?GLUCOSE 147* 117* 156* 146*  ?BUN 24* 40* 54* 65*  ?CREATININE 1.07 1.71* 1.96* 2.18*  ?CALCIUM 8.8* 8.3* 8.0* 8.1*  ?MG  --  2.2  --  2.7*  ?PROT 6.8 6.1*  --  6.2*  ?ALBUMIN 4.0 3.7  --  3.6  ?AST 19 21  --  22  ?ALT 13 12  --  15  ?ALKPHOS 46 39  --  46  ?BILITOT 0.8 0.8  --  0.8  ?GFRNONAA >60 39* 33* 29*  ?ANIONGAP '8 5 7 9  '$ ?  ?Lipids No results for input(s): CHOL, TRIG, HDL, LABVLDL, LDLCALC, CHOLHDL in the last 168 hours.  ?Hematology ?Recent Labs  ?Lab 10/10/21 ?0300 10/11/21 ?0258 10/13/21 ?0304  ?WBC 5.2 4.6 4.6  ?RBC 4.62 4.26 4.16*  ?HGB 14.0 12.8* 12.5*  ?HCT 45.2 41.2 41.3  ?MCV 97.8 96.7 99.3  ?MCH 30.3 30.0 30.0  ?MCHC 31.0 31.1 30.3  ?RDW 14.0  13.6 13.3  ?PLT 139* 117* 114*  ? ?Thyroid No results for input(s): TSH, FREET4 in the last 168 hours.  ?BNP ?Recent Labs  ?Lab 10/08/2021 ?1257 10/10/21 ?0300  ?BNP 1,161.9* 1,077.4*  ?  ?DDimer No results for input(s): DDIMER in the last 168 hours.  ? ?Radiology  ?  ?DG CHEST PORT 1 VIEW ? ?Result Date: 10/13/2021 ?CLINICAL DATA:  88 year old male with history of COPD. Decreased oxygen saturations. EXAM: PORTABLE CHEST 1 VIEW COMPARISON:  Chest x-ray 10/08/2021. FINDINGS: Images under penetrated, limiting the diagnostic sensitivity and specificity of this examination. With these limitations in mind, there does appear to be some hazy ill-defined opacities in the lungs, most evident throughout the right mid to lower lung. No confluent consolidative airspace disease. No pleural effusions. No pneumothorax. No evidence of pulmonary edema. Heart size is borderline enlarged. Upper mediastinal contours are within normal limits. Atherosclerotic calcifications in the thoracic aorta. Status post median sternotomy for CABG including LIMA. IMPRESSION: 1. Hazy ill-defined opacities in the lungs,  most evident throughout the right mid to lower lung. Clinical correlation for atypical infection is recommended. 2. Aortic atherosclerosis. Electronically Signed   By: Vinnie Langton M.D.   On: 10/13/2021 08:56   ? ?Cardiac Studies  ? ?Echo 10/10/21 ? 1. Cannot fully assess wall motion. Left ventricular ejection fraction,  ?by estimation, is 35 to 40%. The left ventricle has moderately decreased  ?function. There is mild left ventricular hypertrophy of the basal segment.  ?Left ventricular diastolic  ?parameters are indeterminate.  ? 2. Right ventricular systolic function is moderately reduced. The right  ?ventricular size is not well visualized. There is normal pulmonary artery  ?systolic pressure.  ? 3. The mitral valve is grossly normal. No evidence of mitral valve  ?regurgitation.  ? 4. Aortic valve regurgitation is not visualized.  ? 5. The inferior vena cava is dilated in size with >50% respiratory  ?variability, suggesting right atrial pressure of 8 mmHg.  ? ?Conclusion(s)/Recommendation(s): Compared to prior study 04/11/2021, EF  ?more reduced.  ? ?Patient Profile  ?   ?88 y.o. male with a hx of CAD s/p CABG and PCI, chronic diastolic heart failure, chronic kidney disease stage III, hypertension, hyperlipidemia, diabetes mellitus, stroke, COPD with hypoxia and hiatal hernia who is being seen 10/11/2021 for the evaluation of low EF at the request of Dr. Verlon Au. ? ?Assessment & Plan  ?  ?Acute systolic heart failure ?- toprol XL 25 mg daily. 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 Not a cath candidate at this time. ?- unable to titrate other GDMT due to AKI. Diuresis performed and now on hold.  ?  ?2. CAD s/p CABG in 1999 and PCI in 2009 ?- No reported chest pain  ?- Continue ASA and BB ?- Consider resuming home statin  ?- EKG with nonspecific ST/T wave abnormality, no significant changes from prior ?  ?3. Acute on CKD III ? - rising BUN /Scr  ?- may represent ATN from aggressive diuresis.  ? ?4.  PVCs ?- BB as above  ? ?Palliative medicine involved. At this juncture, no further interventions recommended this admission from cardiology. Please contact our service with further questions. We will sign off. ? ?For questions or updates, please contact [place] ?Please consult [email or web address] for contact info under  ? ?  ?   ?Signed, ?Elouise Munroe, MD  ?10/13/2021, 6:45 PM    ? ? ?

## 2021-10-13 NOTE — Progress Notes (Signed)
PHARMACY NOTE:  RENAL DOSAGE ADJUSTMENT ? ?Lovenox 40> 30 for CrCl < 30 ml/min ? ?Eudelia Bunch, Pharm.D ?10/13/2021 7:29 AM ? ?

## 2021-10-13 NOTE — Progress Notes (Addendum)
?PROGRESS NOTE ? ? ?Luke [name]  WSF:[id number] DOB: [birth year] DOA: 10/05/2021 ?PCP: Aretta Nip, MD  ?Brief Narrative:  ? ?5 community dwelling white male ?CAD CABG 1999 K LAD with stent ?HFpEF ?DM TY 2 with prior hypoglycemic issues on prior admission in 2022 ?CKD 3B-baseline creatinine about 1.6 ?HTN HLD COPD, some mild cognitive impairment- ?Recent admit 2/3-08/30/18/2023 LL cellulitis Rx initially IV and then oral antibiotics--his Lasix was discontinued on discharge ?  ?Recent visit Dr. Irish Lack office 10/07/2021 Theressa Millard his note]-about 13 pound weight gain from last admission noted to go up from  ?210-223 lb ?At that office visit he was told to continue his Lasix at 80 mg in the morning and home health was ordered ?  ?Had also seen Dr. Lamonte Sakai recently and had declined the use of oxygen ?  ?Represented to the hospital with a fall and slight LOC-it appears that the patient fell out of bed accidentally-he lives alone with his wife-he refused EMS transport initially but then called his son in the middle of the night-found to be 88% on room air when worked up by EMS ?  ?He was also found to have a large bruise on the top of his head ? ?He has multifactorial acute resp failure--2/2 type 2 resp failure from Undiagnosed OSA and Acute Heart failure ?Cardiology formally consulted 3/20 ? ?Hospital-Problem based course ? ?Hopsital acquired delirium secondary to either hypercarbia or urinary retention ?Has had mild delirium since 3/20 where he was agitated ?Sleepy today ?Expect will clear slowly--Mandatory bipap at night--Son has allied with us--will encourage patient. ?I am not overtly concerned about stroke-I think that his mental status is explained by delirium ?Multifactorial acute respiratory failure  ?1]Aspiration PNA ?Cxr 3/22 shows lucency in RLL--will start unasyn--discussed with family ideally in such cases would avoid bipap, but can use as PRN for hypercarbia---SLP to revisit--diet down to dys 1, honey  thick ?1]Decompensated CHF with acute type II respiratory failure secondary to probable undiagnosed CPAP- ?Hold Lasix 60 IV twice daily-^^^ in creat--give NS 75cc/h ?low-dose Toprol 12.5 daily-->25 per Cardiology 3/21 ?EF lower than usual [55-->45%]see below ?2]PVC's 3/21 ?Toprol XL increased by cardiology ?2]Probable undiagnosed OSA ?ABG performed 3/18 = CO2 of 69 ?Continue as best possible mandatory hs CPAP and AT ALL TIMES when sleeping ?3] COPD component is small ?Abnormal EKG on admission ?EKG 3/19 = J-point elevation ?Continue aspirin-he is not having active chest pain ?ECHO this admit decreased EF from --no current plans for Cath ?AKI /ATN superimpsed on CKD2  ?Acute urinary retention 3/22 showed 220 being retained- ?Worsened from admit-received lasix till 3/120 ?Start IVF ?Fe UREA is pending ?Bladder scan balancing the risk and benefits we have decided to place an indwelling Foley if patient retains again ?LOC/fall accidentally out of bed ?Bruise ot top of head ?Imaging of head 2/2 negative from admission ?Reorient as needed ?DM TY 2 ?Goal blood sugar is greater than 150 given 2/2 mentation issues below this level ?   ? Levemir 6 units Liberalize diet, CBG's 140-200---KEEP SUGARS ABOVE 160 [gets confused if below 150] ?Moderate COPD with compensated respiratory alkalosis =/-Contraction alaklosis compenent from diuretics ?CO2 34-cont Diamox  ?Repeat labs a.m. ?Patulous esophagus ?Cleared for reg solids thin liquids by SLP ?Graduate diet to soft ? ?DVT prophylaxis: Lovenox ?Code Status: DNR confirmed on admission ?Family Communication: long discussion with Wille Glaser, son on phone ?Palliative has seen ?Care confounded by patient being the care-giver for his wife who has mod dementia ?TOC is working on a  consolidated plan for patient ?Disposition:  ?Status is: Inpatient ?Remains inpatient appropriate because: Requires probable BiPAP and not ready to transfer to floor as he ?  ?Consultants:  ?None at this  time ? ?Procedures: No ? ?Antimicrobials:  ? ?Subjective: ? ?Reevaluated finally at around 5:00 this afternoon ?He is finally awake ?He is somewhat confused ?He has been retaining urine 220 was out with his bladder scan and Foley in and out ?He can recognize his family members but does not know where he is and his delirium remains ? ?Objective: ?Vitals:  ? 10/13/21 0848 10/13/21 1104 10/13/21 1200 10/13/21 1234  ?BP:  (!) 132/55 (!) 117/45   ?Pulse: 76 76 71 61  ?Resp: '19 19 19 20  '$ ?Temp:      ?TempSrc:      ?SpO2: 97% 96% 98% 97%  ?Weight:      ?Height:      ? ? ?Intake/Output Summary (Last 24 hours) at 10/13/2021 1305 ?Last data filed at 10/13/2021 0848 ?Gross per 24 hour  ?Intake --  ?Output 1000 ml  ?Net -1000 ml  ? ? ?Filed Weights  ? 10/11/2021 2028 10/12/21 0643  ?Weight: 95.5 kg 95.1 kg  ? ? ?Examination: ? ?More awake interactive on oxygen-when he is taken off high flow cannula of 3 L he desats to the 83% range ?Chest relatively clear ?S1 s2 no m/r/g ?Abdomen no rebound no guarding ?Neuro--sleepy but rousable ?Power seems 5/5 ? ?Data Reviewed: personally reviewed  ? ?CBC ?   ?Component Value Date/Time  ? WBC 4.6 10/13/2021 0304  ? RBC 4.16 (L) 10/13/2021 0304  ? HGB 12.5 (L) 10/13/2021 0304  ? HCT 41.3 10/13/2021 0304  ? PLT 114 (L) 10/13/2021 0304  ? MCV 99.3 10/13/2021 0304  ? MCH 30.0 10/13/2021 0304  ? MCHC 30.3 10/13/2021 0304  ? RDW 13.3 10/13/2021 0304  ? LYMPHSABS 0.9 10/13/2021 0304  ? MONOABS 0.8 10/13/2021 0304  ? EOSABS 0.2 10/13/2021 0304  ? BASOSABS 0.0 10/13/2021 0304  ? ? ?  Latest Ref Rng & Units 10/13/2021  ?  3:04 AM 10/12/2021  ?  2:29 AM 10/11/2021  ?  2:58 AM  ?CMP  ?Glucose 70 - 99 mg/dL 146   156   117    ?BUN 8 - 23 mg/dL 65   54   40    ?Creatinine 0.61 - 1.24 mg/dL 2.18   1.96   1.71    ?Sodium 135 - 145 mmol/L 137   136   137    ?Potassium 3.5 - 5.1 mmol/L 4.0   3.9   4.1    ?Chloride 98 - 111 mmol/L 94   93   94    ?CO2 22 - 32 mmol/L 34   36   38    ?Calcium 8.9 - 10.3 mg/dL 8.1   8.0    8.3    ?Total Protein 6.5 - 8.1 g/dL 6.2    6.1    ?Total Bilirubin 0.3 - 1.2 mg/dL 0.8    0.8    ?Alkaline Phos 38 - 126 U/L 46    39    ?AST 15 - 41 U/L 22    21    ?ALT 0 - 44 U/L 15    12    ? ? ? ?Radiology Studies: ?DG CHEST PORT 1 VIEW ? ?Result Date: 10/13/2021 ?CLINICAL DATA:  88 year old male with history of COPD. Decreased oxygen saturations. EXAM: PORTABLE CHEST 1 VIEW COMPARISON:  Chest x-ray 10/12/2021. FINDINGS:  Images under penetrated, limiting the diagnostic sensitivity and specificity of this examination. With these limitations in mind, there does appear to be some hazy ill-defined opacities in the lungs, most evident throughout the right mid to lower lung. No confluent consolidative airspace disease. No pleural effusions. No pneumothorax. No evidence of pulmonary edema. Heart size is borderline enlarged. Upper mediastinal contours are within normal limits. Atherosclerotic calcifications in the thoracic aorta. Status post median sternotomy for CABG including LIMA. IMPRESSION: 1. Hazy ill-defined opacities in the lungs, most evident throughout the right mid to lower lung. Clinical correlation for atypical infection is recommended. 2. Aortic atherosclerosis. Electronically Signed   By: Vinnie Langton M.D.   On: 10/13/2021 08:56   ? ? ?Scheduled Meds: ? acetaZOLAMIDE ER  1,000 mg Oral Q12H  ? aspirin EC  81 mg Oral Daily  ? Chlorhexidine Gluconate Cloth  6 each Topical Q0600  ? enoxaparin (LOVENOX) injection  30 mg Subcutaneous Q24H  ? insulin detemir  6 Units Subcutaneous Daily  ? mouth rinse  15 mL Mouth Rinse BID  ? metoprolol succinate  25 mg Oral Daily  ? mupirocin ointment  1 application. Nasal BID  ? umeclidinium-vilanterol  1 puff Inhalation Daily  ? ?Continuous Infusions: ? sodium chloride 75 mL/hr at 10/13/21 0853  ? ? ? LOS: 4 days  ? ?Time spent: 37 minutes ? ?Nita Sells, MD ?[facility] ?To contact the attending provider between 7A-7P or the covering provider during  after hours 7P-7A, please log into the web site [email or web address] and access using universal [place] password for that web site. If you do not have the password, please call the hospital operator. ? ?10/13/2021, 1:05 PM

## 2021-10-13 NOTE — Progress Notes (Incomplete)
? ?Progress Note ? ?Patient Name: Luke [name] ?Date of Encounter: 10/13/2021 ? ?[facility] Cardiologist: Larae Grooms, MD *** ? ?Subjective  ? ?*** ? ?Inpatient Medications  ?  ?Scheduled Meds: ? acetaZOLAMIDE ER  1,000 mg Oral Q12H  ? aspirin EC  81 mg Oral Daily  ? Chlorhexidine Gluconate Cloth  6 each Topical Q0600  ? enoxaparin (LOVENOX) injection  30 mg Subcutaneous Q24H  ? insulin detemir  6 Units Subcutaneous Daily  ? mouth rinse  15 mL Mouth Rinse BID  ? metoprolol succinate  25 mg Oral Daily  ? mupirocin ointment  1 application. Nasal BID  ? umeclidinium-vilanterol  1 puff Inhalation Daily  ? ?Continuous Infusions: ? sodium chloride 75 mL/hr at 10/13/21 0853  ? ?PRN Meds: ?albuterol, food thickener, metoprolol tartrate, nitroGLYCERIN  ? ?Vital Signs  ?  ?Vitals:  ? 10/13/21 0800 10/13/21 0840 10/13/21 0848 10/13/21 1104  ?BP: 102/60   (!) 132/55  ?Pulse: (!) 59  76 76  ?Resp: '19  19 19  '$ ?Temp:  98.7 ?F (37.1 ?C)    ?TempSrc:  Axillary    ?SpO2: 97% 97% 97% 96%  ?Weight:      ?Height:      ? ? ?Intake/Output Summary (Last 24 hours) at 10/13/2021 1126 ?Last data filed at 10/13/2021 0848 ?Gross per 24 hour  ?Intake --  ?Output 1000 ml  ?Net -1000 ml  ? ? ?  10/12/2021  ?  6:43 AM 10/22/2021  ?  8:28 PM 10/07/2021  ? 11:14 AM  ?Last 3 Weights  ?Weight (lbs) 209 lb 10.5 oz 210 lb 8.6 oz 223 lb  ?Weight (kg) 95.1 kg 95.5 kg 101.152 kg  ?   ? ?Telemetry  ?  ?*** - Personally Reviewed ? ?ECG  ?  ?*** - Personally Reviewed ? ?Physical Exam  ?*** ?GEN: No acute distress.   ?Neck: No JVD ?Cardiac: RRR, no murmurs, rubs, or gallops.  ?Respiratory: Clear to auscultation bilaterally. ?GI: Soft, nontender, non-distended  ?MS: No edema; No deformity. ?Neuro:  Nonfocal  ?Psych: Normal affect  ? ?Labs  ?  ?High Sensitivity Troponin:   ?Recent Labs  ?Lab 10/03/2021 ?1624 09/30/2021 ?1908  ?TROPONINIHS 32* 33*  ?   ?Chemistry ?Recent Labs  ?Lab 10/10/21 ?0300 10/11/21 ?0258 10/12/21 ?0229 10/13/21 ?0304  ?NA 141 137 136  137  ?K 3.9 4.1 3.9 4.0  ?CL 96* 94* 93* 94*  ?CO2 37* 38* 36* 34*  ?GLUCOSE 147* 117* 156* 146*  ?BUN 24* 40* 54* 65*  ?CREATININE 1.07 1.71* 1.96* 2.18*  ?CALCIUM 8.8* 8.3* 8.0* 8.1*  ?MG  --  2.2  --  2.7*  ?PROT 6.8 6.1*  --  6.2*  ?ALBUMIN 4.0 3.7  --  3.6  ?AST 19 21  --  22  ?ALT 13 12  --  15  ?ALKPHOS 46 39  --  46  ?BILITOT 0.8 0.8  --  0.8  ?GFRNONAA >60 39* 33* 29*  ?ANIONGAP '8 5 7 9  '$ ?  ?Lipids No results for input(s): CHOL, TRIG, HDL, LABVLDL, LDLCALC, CHOLHDL in the last 168 hours.  ?Hematology ?Recent Labs  ?Lab 10/10/21 ?0300 10/11/21 ?0258 10/13/21 ?0304  ?WBC 5.2 4.6 4.6  ?RBC 4.62 4.26 4.16*  ?HGB 14.0 12.8* 12.5*  ?HCT 45.2 41.2 41.3  ?MCV 97.8 96.7 99.3  ?MCH 30.3 30.0 30.0  ?MCHC 31.0 31.1 30.3  ?RDW 14.0 13.6 13.3  ?PLT 139* 117* 114*  ? ?Thyroid No results for input(s): TSH, FREET4 in  the last 168 hours.  ?BNP ?Recent Labs  ?Lab 10/10/2021 ?1257 10/10/21 ?0300  ?BNP 1,161.9* 1,077.4*  ?  ?DDimer No results for input(s): DDIMER in the last 168 hours.  ? ?Radiology  ?  ?DG CHEST PORT 1 VIEW ? ?Result Date: 10/13/2021 ?CLINICAL DATA:  88 year old male with history of COPD. Decreased oxygen saturations. EXAM: PORTABLE CHEST 1 VIEW COMPARISON:  Chest x-ray 10/18/2021. FINDINGS: Images under penetrated, limiting the diagnostic sensitivity and specificity of this examination. With these limitations in mind, there does appear to be some hazy ill-defined opacities in the lungs, most evident throughout the right mid to lower lung. No confluent consolidative airspace disease. No pleural effusions. No pneumothorax. No evidence of pulmonary edema. Heart size is borderline enlarged. Upper mediastinal contours are within normal limits. Atherosclerotic calcifications in the thoracic aorta. Status post median sternotomy for CABG including LIMA. IMPRESSION: 1. Hazy ill-defined opacities in the lungs, most evident throughout the right mid to lower lung. Clinical correlation for atypical infection is recommended.  2. Aortic atherosclerosis. Electronically Signed   By: Vinnie Langton M.D.   On: 10/13/2021 08:56   ? ?Cardiac Studies  ? ?*** ? ?Patient Profile  ?   ?88 y.o. male *** ? ?Assessment & Plan  ?  ?*** ?{Are we signing off today?:[id number]} ? ?For questions or updates, please contact [place] ?Please consult [email or web address] for contact info under  ? ?  ?   ?Signed, ?Almyra Deforest, [place]  ?10/13/2021, 11:26 AM    ?

## 2021-10-13 NOTE — Progress Notes (Signed)
Palliative: ? ?Came by Luke [name]'s room x 3 - no family. I attempted to call Wille Glaser to discuss status and path forward including aggressiveness of cardiac intervention desired. He would be a very poor candidate for cardiac catheterization given ongoing confusion this hospitalization. Mr. Sida is more sedate and confused today and intake has declined. Concern for renal decline and mental status. Unable to reach [place]. Will ask my colleagues to follow up on attempts tomorrow.  ? ?No charge ? ?Vinie Sill, NP ?Palliative Medicine Team ?Pager [phone] (Please see [email or web address] for schedule) ?Team Phone [phone]  ?

## 2021-10-13 NOTE — NC FL2 (Signed)
?[place] MEDICAID FL2 LEVEL OF CARE SCREENING TOOL  ?  ? ?IDENTIFICATION  ?Patient Name: ?Luke [name] Birthdate: Mar 27, 1933 Sex: male Admission Date (Current Location): ?09/29/2021  ?[place] and [place] Number: ? [place] ?  Facility and Address:  ?[facility],  [address] [place], [place] ?     Provider Number: ?[id number]  ?Attending Physician Name and Address:  ?Nita Sells, MD ? Relative Name and Phone Number:  ?Bartell,Jon Pandora Leiter)   [phone] ?   ?Current Level of Care: ?Hospital Recommended Level of Care: ?[place] Prior Approval Number: ?  ? ?Date Approved/Denied: ?  PASRR Number: ?[id number] A ? ?Discharge Plan: ?SNF ?  ? ?Current Diagnoses: ?Patient Active Problem List  ? Diagnosis Date Noted  ? Acute clinical systolic heart failure ([place]) 10/10/2021  ? Heart failure ([place]) 09/27/2021  ? Recurrent cellulitis of lower leg 08/27/2021  ? Acute encephalopathy 04/10/2021  ? B12 deficiency 11/23/2020  ? Vitamin D deficiency 11/23/2020  ? Falls 11/23/2020  ? Syncope and collapse 11/23/2020  ? Hypoglycemia 11/23/2020  ? COPD with acute exacerbation ([place]) 11/23/2020  ? COPD (chronic obstructive pulmonary disease) ([place]) 09/18/2017  ? Hypoxemia 09/18/2017  ? Primary hypothyroidism 09/19/2016  ? Stage 3a chronic kidney disease ([place]) 09/19/2016  ? Type 2 diabetes mellitus with hyperglycemia, with long-term current use of insulin ([place]) 09/19/2016  ? CRI (chronic renal insufficiency) 06/09/2014  ? Overweight 06/09/2014  ? Coronary atherosclerosis of native coronary artery 12/06/2013  ? Mixed hyperlipidemia 12/06/2013  ? Nonspecific abnormal results of cardiovascular function study 12/06/2013  ? Chronic diastolic heart failure ([place]) 12/06/2013  ? Essential hypertension, benign 12/06/2013  ? AKI (acute kidney injury) ([place]) 03/17/2013  ? N&V (nausea and vomiting) 03/17/2013  ? Diarrhea 03/17/2013  ? DM (diabetes mellitus) ([place]) 03/17/2013  ? Hypotension 03/17/2013  ?  Dizziness 03/17/2013  ? ? ?Orientation RESPIRATION BLADDER Height & Weight   ?  ?Self ? O2 (3L [place]) External catheter Weight: 95.1 kg ?Height:  '5\' 11"'$  (180.3 cm)  ?BEHAVIORAL SYMPTOMS/MOOD NEUROLOGICAL BOWEL NUTRITION STATUS  ?    Incontinent Diet (see d/c summary)  ?AMBULATORY STATUS COMMUNICATION OF NEEDS Skin   ?Extensive Assist Verbally Other (Comment) (Laceration-top of head) ?  ?  ?  ?    ?     ?     ? ? ?Personal Care Assistance Level of Assistance  ?Bathing, Feeding, Dressing Bathing Assistance: Maximum assistance ?Feeding assistance: Independent ?Dressing Assistance: Maximum assistance ?   ? ?Functional Limitations Info  ?Sight, Hearing, Speech Sight Info: Adequate ?Hearing Info: Adequate ?Speech Info: Adequate  ? ? ?SPECIAL CARE FACTORS FREQUENCY  ?PT (By licensed PT), OT (By licensed OT)   ?  ?PT Frequency: 5X/W ?OT Frequency: 5X/W ?  ?  ?  ?   ? ? ?Contractures Contractures Info: Not present  ? ? ?Additional Factors Info  ?Code Status, Allergies Code Status Info: DNR ?Allergies Info: NKA ?  ?  ?  ?   ? ?Current Medications (10/13/2021):  This is the current hospital active medication list ?Current Facility-Administered Medications  ?Medication Dose Route Frequency Provider Last Rate Last Admin  ? 0.9 %  sodium chloride infusion   Intravenous Continuous Nita Sells, MD 75 mL/hr at 10/13/21 0853 New Bag at 10/13/21 0853  ? acetaZOLAMIDE ER (DIAMOX) 12 hr capsule 1,000 mg  1,000 mg Oral Q12H Samtani, Jai-Gurmukh, MD   1,000 mg at 10/13/21 1105  ? albuterol (PROVENTIL) (2.5 MG/3ML) 0.083% nebulizer solution 3 mL  3 mL  Nebulization Q6H PRN Nita Sells, MD   3 mL at 10/10/21 0859  ? aspirin EC tablet 81 mg  81 mg Oral Daily Nita Sells, MD   81 mg at 10/13/21 1105  ? Chlorhexidine Gluconate Cloth 2 % PADS 6 each  6 each Topical Q0600 Nita Sells, MD   6 each at 10/12/21 2228  ? enoxaparin (LOVENOX) injection 30 mg  30 mg Subcutaneous Q24H Eudelia Bunch, RPH      ? food  thickener (SIMPLYTHICK (NECTAR/LEVEL 2/MILDLY THICK)) 1 packet  1 packet Oral PRN Nita Sells, MD      ? insulin detemir (LEVEMIR) injection 6 Units  6 Units Subcutaneous Daily Nita Sells, MD   6 Units at 10/13/21 1103  ? MEDLINE mouth rinse  15 mL Mouth Rinse BID Nita Sells, MD   15 mL at 10/13/21 1111  ? metoprolol succinate (TOPROL-XL) 24 hr tablet 25 mg  25 mg Oral Daily Bhagat, Bhavinkumar, PA   25 mg at 10/13/21 1104  ? metoprolol tartrate (LOPRESSOR) injection 2.5 mg  2.5 mg Intravenous Q6H PRN Nita Sells, MD      ? mupirocin ointment (BACTROBAN) 2 % 1 application.  1 application. Nasal BID Nita Sells, MD   1 application. at 10/13/21 1110  ? nitroGLYCERIN (NITROSTAT) SL tablet 0.4 mg  0.4 mg Sublingual Q5 min PRN Nita Sells, MD      ? umeclidinium-vilanterol (ANORO ELLIPTA) 62.5-25 MCG/ACT 1 puff  1 puff Inhalation Daily Nita Sells, MD   1 puff at 10/13/21 8315  ? ? ? ?Discharge Medications: ?Please see discharge summary for a list of discharge medications. ? ?Relevant Imaging Results: ? ?Relevant Lab Results: ? ? ?Additional Information ?[phone] ? ?Trish Mage, LCSW ? ? ? ? ?

## 2021-10-13 NOTE — Progress Notes (Incomplete)
Palliative:  HPI: 88 y.o. male  with past medical history of CHF, s/p [employer] PPM/ICD, CAD, IDDM, CKD stage 3b, prostatomegaly admitted on 12/22/2021 with weakness x 3 days after fall followed with pain, difficulty to ambulate, decrease intake and urine output. Noted to have hypotension, leukocytosis related to discitis aspiration showing +Klebsiella along with UTI. Hospitalization complicated by cardiogenic shock EF <20%, grade 2 diastolic dysfunction, moderate pulmonary hypertension, renal failure, bladder outlet obstruction, paroxysmal atrial fibrillation. Ongoing significant fluid overload and poor response to diuresis. Trial large dose of Lasix while on dobutamine.   I met again today with Luke [name] along with his daughter and wife at bedside. Luke [name] continues to speak to his poor prognosis and is able to tell his family that he is very much at peace. He reviews that his vital signs have been good but he understands that his heart is "give out" - he says that his heart has served him well for many good years. He is a very spiritual man and reports that he had wonderful visits with chaplain Luke [name]. He is at peace and is prepared for transition to comfort care when his other daughter returns from [place]. Family at bedside report that they believed she would be back Thurs/Fri but she will not return until Monday July 3rd. Luke [name] was disheartened that she does not return until Monday but they all continue to report their goal to continue to maintain until she returns and then we will transition to full comfort care and allow natural and peaceful death.   I spoke with them again about my recommendation for deactivation of AICD and they all agree that this would be in his best interest at this time. I explained the process and will place order for [employer] to come to deactivate.   All questions/concerns addressed. Emotional support provided.   Exam: Alert, oriented. Frail.  Lying in bed. No distress. Generalized weakness and fatigue. HR 90-100s. Breathing regular, unlabored at rest. Abd soft. BLE edema.   Plan: - DNR - Deactivate AICD - Awaiting daughter's return from vacation to say goodbye and then will transition to full comfort care (she should be back Monday)  40 min  Luke Obst, NP Palliative Medicine Team Pager [phone] (Please see [email or web address] for schedule) Team Phone [phone]    Greater than 50%  of this time was spent counseling and coordinating care related to the above assessment and plan   

## 2021-10-13 NOTE — Progress Notes (Signed)
Pharmacy Antibiotic Note ? ?Luke [name] is a 88 y.o. male admitted on 09/28/2021 with chief complaint of SOB, fall. Pharmacy has been consulted for Unasyn dosing for aspiration pneumonia. ? ?Plan: ?Unasyn 3g IV q12h ?Monitor renal function, cultures, clinical course ? ?Height: '5\' 11"'$  (180.3 cm) ?Weight: 95.1 kg (209 lb 10.5 oz) ?IBW/kg (Calculated) : 75.3 ? ?Temp (24hrs), Avg:98.7 ?F (37.1 ?C), Min:97.4 ?F (36.3 ?C), Max:99.5 ?F (37.5 ?C) ? ?Recent Labs  ?Lab 10/18/2021 ?1256 10/10/21 ?0300 10/11/21 ?0258 10/12/21 ?0229 10/13/21 ?0304  ?WBC 5.5 5.2 4.6  --  4.6  ?CREATININE 1.05 1.07 1.71* 1.96* 2.18*  ?  ?Estimated Creatinine Clearance: 28.6 mL/min (A) (by C-G formula based on SCr of 2.18 mg/dL (H)).   ? ?No Known Allergies ? ? ?Thank you for allowing pharmacy to be a part of this patient?s care. ? ?Luiz Ochoa ?10/13/2021 5:53 PM ? ?

## 2021-10-14 ENCOUNTER — Inpatient Hospital Stay (HOSPITAL_COMMUNITY): Payer: Medicare Other

## 2021-10-14 DIAGNOSIS — I5021 Acute systolic (congestive) heart failure: Secondary | ICD-10-CM | POA: Diagnosis not present

## 2021-10-14 LAB — CBC WITH DIFFERENTIAL/PLATELET
Abs Immature Granulocytes: 0.02 10*3/uL (ref 0.00–0.07)
Basophils Absolute: 0 10*3/uL (ref 0.0–0.1)
Basophils Relative: 1 %
Eosinophils Absolute: 0.2 10*3/uL (ref 0.0–0.5)
Eosinophils Relative: 3 %
HCT: 39.8 % (ref 39.0–52.0)
Hemoglobin: 12.1 g/dL — ABNORMAL LOW (ref 13.0–17.0)
Immature Granulocytes: 0 %
Lymphocytes Relative: 13 %
Lymphs Abs: 0.7 10*3/uL (ref 0.7–4.0)
MCH: 30.3 pg (ref 26.0–34.0)
MCHC: 30.4 g/dL (ref 30.0–36.0)
MCV: 99.7 fL (ref 80.0–100.0)
Monocytes Absolute: 0.7 10*3/uL (ref 0.1–1.0)
Monocytes Relative: 13 %
Neutro Abs: 3.9 10*3/uL (ref 1.7–7.7)
Neutrophils Relative %: 70 %
Platelets: 128 10*3/uL — ABNORMAL LOW (ref 150–400)
RBC: 3.99 MIL/uL — ABNORMAL LOW (ref 4.22–5.81)
RDW: 13.2 % (ref 11.5–15.5)
WBC: 5.5 10*3/uL (ref 4.0–10.5)
nRBC: 0 % (ref 0.0–0.2)

## 2021-10-14 LAB — GLUCOSE, CAPILLARY
Glucose-Capillary: 128 mg/dL — ABNORMAL HIGH (ref 70–99)
Glucose-Capillary: 116 mg/dL — ABNORMAL HIGH (ref 70–99)
Glucose-Capillary: 131 mg/dL — ABNORMAL HIGH (ref 70–99)
Glucose-Capillary: 145 mg/dL — ABNORMAL HIGH (ref 70–99)
Glucose-Capillary: 121 mg/dL — ABNORMAL HIGH (ref 70–99)

## 2021-10-14 LAB — MAGNESIUM: Magnesium: 2.6 mg/dL — ABNORMAL HIGH (ref 1.7–2.4)

## 2021-10-14 LAB — COMPREHENSIVE METABOLIC PANEL
ALT: 15 U/L (ref 0–44)
AST: 18 U/L (ref 15–41)
Albumin: 3.6 g/dL (ref 3.5–5.0)
Alkaline Phosphatase: 42 U/L (ref 38–126)
Anion gap: 7 (ref 5–15)
BUN: 61 mg/dL — ABNORMAL HIGH (ref 8–23)
CO2: 32 mmol/L (ref 22–32)
Calcium: 8 mg/dL — ABNORMAL LOW (ref 8.9–10.3)
Chloride: 99 mmol/L (ref 98–111)
Creatinine, Ser: 1.7 mg/dL — ABNORMAL HIGH (ref 0.61–1.24)
GFR, Estimated: 39 mL/min — ABNORMAL LOW (ref >60)
Glucose, Bld: 144 mg/dL — ABNORMAL HIGH (ref 70–99)
Potassium: 3.7 mmol/L (ref 3.5–5.1)
Sodium: 138 mmol/L (ref 135–145)
Total Bilirubin: 0.6 mg/dL (ref 0.3–1.2)
Total Protein: 5.9 g/dL — ABNORMAL LOW (ref 6.5–8.1)

## 2021-10-14 LAB — BLOOD GAS, ARTERIAL
Acid-Base Excess: 6.3 mmol/L — ABNORMAL HIGH (ref 0.0–2.0)
Bicarbonate: 35.6 mmol/L — ABNORMAL HIGH (ref 20.0–28.0)
Drawn by: 270211
FIO2: 32 %
O2 Saturation: 96.2 %
Patient temperature: 37
pCO2 arterial: 74 mmHg (ref 32–48)
pH, Arterial: 7.29 — ABNORMAL LOW (ref 7.35–7.45)
pO2, Arterial: 80 mmHg — ABNORMAL LOW (ref 83–108)

## 2021-10-14 LAB — UREA NITROGEN, URINE: Urea Nitrogen, Ur: 781 mg/dL

## 2021-10-14 MED ORDER — DOCUSATE SODIUM 100 MG PO CAPS
100.0000 mg | ORAL_CAPSULE | Freq: Two times a day (BID) | ORAL | Status: DC
  Administered 2021-10-14 – 2021-10-18 (×8): 100 mg via ORAL
  Filled 2021-10-14 (×9): qty 1

## 2021-10-14 MED ORDER — SODIUM CHLORIDE 0.9 % IV SOLN
3.0000 g | Freq: Three times a day (TID) | INTRAVENOUS | Status: DC
  Administered 2021-10-14 – 2021-10-18 (×13): 3 g via INTRAVENOUS
  Filled 2021-10-14 (×14): qty 8

## 2021-10-14 MED ORDER — ENOXAPARIN SODIUM 40 MG/0.4ML IJ SOSY
40.0000 mg | PREFILLED_SYRINGE | INTRAMUSCULAR | Status: DC
  Administered 2021-10-14 – 2021-10-17 (×4): 40 mg via SUBCUTANEOUS
  Filled 2021-10-14 (×3): qty 0.4

## 2021-10-14 NOTE — Progress Notes (Signed)
?PROGRESS NOTE ? ? ? ?Luke [name]  IPJ:[id number] DOB: 01-Sep-1932 DOA: 10/16/2021 ?PCP: Aretta Nip, MD  ? ? ?Brief Narrative:  ?88 year old gentleman from home with history of coronary artery disease status post CABG, LAD with stenting, chronic diastolic heart failure, type 2 diabetes, CKD stage II with baseline creatinine about 1.6, hypertension, hyperlipidemia, COPD and mild cognitive impairment with recent multiple hospitalization admitted to hospital, following at home and confusion.  In the emergency room he was 88% on room air.  Recently visited cardiology office with 13 pound weight gain and was continued on Lasix. ?Remains in the hospital with multifactorial respiratory failure, encephalopathy. ? ? ?Assessment & Plan: ?  ?Acute metabolic encephalopathy suspect multifactorial, likely hospital-acquired delirium/hypercapnic respiratory failure: ?Agitation and delirium started 3/20, mostly sleepy.  Not on any sedative medications.  Continue monitoring.  Nonfocal exam.  CT head and C-spine on admission were essentially normal. ? ?Multifactorial acute hypoxemic and hypercapnic respiratory failure: ?- Aspiration pneumonia, chest x-ray 3/22 with worsening asymmetrical opacity right lower lobe.  On Unasyn.  Repeat speech evaluation today.  Afebrile.  Possibly aspiration pneumonitis. ?-Probable undiagnosed obstructive sleep apnea with COPD: Presentation ABG with CO2 69, will repeat ABG today he has been on consistent BiPAP with 40% FiO2.  Keep on oxygen to keep saturation more than 90%.  BiPAP as needed for respiratory distress.  Bronchodilator therapy. ?-Acute on chronic congestive heart failure with preserved ejection fraction: Treated with Lasix, decompensated renal functions.  Currently on IV fluid 75 mL/h.  Renal functions normalizing.  Urine output 1000 mL last 24 hours.  Discontinue IV fluids today.  Will allow for some compensation and start on low-dose IV Lasix if further improvement in renal  functions by tomorrow.  Patient remains on Toprol-XL.  Patient is on metolazone that was continued. ? ?Acute kidney injury due to ATN superimposed on CKD stage II: Presentation creatinine was 1.07.  Peaked to 2.18 and trending down today.  Urine output more than 1000 mL.  Potassium is adequate.  Unlikely uremia. ?Continue intake and output monitoring.  Discontinue maintenance IV fluid today.  Recheck levels tomorrow. ? ?Type 2 diabetes, well controlled with hyperglycemia: Previous episodes of hypoglycemia.  Keeping blood sugars more than 150.  Liberalize diet as he does not have any good intake.  Blood sugars adequately controlled today on current regimen. ? ?Goal of care: ?Palliative care following.  DNR/DNI.  Current plan is to try to stabilize and transfer to SNF for rehab. ?If he does not do appropriate clinical improvement, may need to discuss about hospice.  Will connect with family. ? ? ? ?DVT prophylaxis: enoxaparin (LOVENOX) injection 30 mg Start: 10/13/21 2200 ? ? ?Code Status: DNR/DNI all ?Family Communication: will call ?Disposition Plan: Status is: Inpatient ?Remains inpatient appropriate because: Altered mental status ?  ? ? ?Consultants:  ?Cardiology, signed off ?Palliative medicine ? ?Procedures:  ?None ? ?Antimicrobials:  ?Unasyn 3/22--- ? ? ?Subjective: ?Patient seen and examined.  Remains pretty somnolent.  He is wearing BiPAP so it is hard to assess his interaction, however patient was able to move all extremities, was incoherent. ?Later on patient was able to wake up with the nursing staff, take morning medications. ? ?Objective: ?Vitals:  ? 10/14/21 0800 10/14/21 0820 10/14/21 0940 10/14/21 1000  ?BP: (!) 139/42   (!) 139/42  ?Pulse:   81 80  ?Resp: (!) 21   (!) 23  ?Temp:  97.9 ?F (36.6 ?C)    ?TempSrc:  Oral    ?SpO2:  93%  ?Weight:      ?Height:      ? ? ?Intake/Output Summary (Last 24 hours) at 10/14/2021 1124 ?Last data filed at 10/14/2021 0800 ?Gross per 24 hour  ?Intake 1842.87 ml   ?Output 750 ml  ?Net 1092.87 ml  ? ?Filed Weights  ? 09/25/2021 2028 10/12/21 0643  ?Weight: 95.5 kg 95.1 kg  ? ? ?Examination: ? ?General exam: Appears calm but confused.  Frail and debilitated gentleman.  Currently on BiPAP with 40% FiO2.  Does not look like he is in any distress. ?Respiratory system: Mostly conducted airway sounds. ?Cardiovascular system: S1 & S2 heard, RRR.  Trace edema left.  No edema on the right leg. ?Gastrointestinal system: Abdomen is nondistended, soft and nontender. No organomegaly or masses felt. Normal bowel sounds heard. ?Central nervous system: Sleepy.  Lethargic.  Arousable.  Unable to assess orientation because of difficult to engage in conversation.  Moves all extremities. ?Extremities: Symmetric 5 x 5 power. ? ? ? ?Data Reviewed: I have personally reviewed following labs and imaging studies ? ?CBC: ?Recent Labs  ?Lab 10/12/2021 ?1256 10/10/21 ?0300 10/11/21 ?0258 10/13/21 ?0304 10/14/21 ?9470  ?WBC 5.5 5.2 4.6 4.6 5.5  ?NEUTROABS 4.1  --  3.8 2.7 3.9  ?HGB 13.4 14.0 12.8* 12.5* 12.1*  ?HCT 42.3 45.2 41.2 41.3 39.8  ?MCV 96.4 97.8 96.7 99.3 99.7  ?PLT 131* 139* 117* 114* 128*  ? ?Basic Metabolic Panel: ?Recent Labs  ?Lab 10/10/21 ?0300 10/11/21 ?0258 10/12/21 ?0229 10/13/21 ?0304 10/14/21 ?9628  ?NA 141 137 136 137 138  ?K 3.9 4.1 3.9 4.0 3.7  ?CL 96* 94* 93* 94* 99  ?CO2 37* 38* 36* 34* 32  ?GLUCOSE 147* 117* 156* 146* 144*  ?BUN 24* 40* 54* 65* 61*  ?CREATININE 1.07 1.71* 1.96* 2.18* 1.70*  ?CALCIUM 8.8* 8.3* 8.0* 8.1* 8.0*  ?MG  --  2.2  --  2.7* 2.6*  ? ?GFR: ?Estimated Creatinine Clearance: 36.7 mL/min (A) (by C-G formula based on SCr of 1.7 mg/dL (H)). ?Liver Function Tests: ?Recent Labs  ?Lab 10/12/2021 ?1256 10/10/21 ?0300 10/11/21 ?0258 10/13/21 ?0304 10/14/21 ?3662  ?AST '18 19 21 22 18  '$ ?ALT '14 13 12 15 15  '$ ?ALKPHOS 43 46 39 46 42  ?BILITOT 0.7 0.8 0.8 0.8 0.6  ?PROT 6.3* 6.8 6.1* 6.2* 5.9*  ?ALBUMIN 3.8 4.0 3.7 3.6 3.6  ? ?No results for input(s): LIPASE, AMYLASE in the last  168 hours. ?No results for input(s): AMMONIA in the last 168 hours. ?Coagulation Profile: ?No results for input(s): INR, PROTIME in the last 168 hours. ?Cardiac Enzymes: ?No results for input(s): CKTOTAL, CKMB, CKMBINDEX, TROPONINI in the last 168 hours. ?BNP (last 3 results) ?No results for input(s): PROBNP in the last 8760 hours. ?HbA1C: ?No results for input(s): HGBA1C in the last 72 hours. ?CBG: ?Recent Labs  ?Lab 10/13/21 ?1514 10/13/21 ?1950 10/13/21 ?2322 10/14/21 ?0405 10/14/21 ?0830  ?GLUCAP 159* 161* 135* 128* 116*  ? ?Lipid Profile: ?No results for input(s): CHOL, HDL, LDLCALC, TRIG, CHOLHDL, LDLDIRECT in the last 72 hours. ?Thyroid Function Tests: ?No results for input(s): TSH, T4TOTAL, FREET4, T3FREE, THYROIDAB in the last 72 hours. ?Anemia Panel: ?No results for input(s): VITAMINB12, FOLATE, FERRITIN, TIBC, IRON, RETICCTPCT in the last 72 hours. ?Sepsis Labs: ?No results for input(s): PROCALCITON, LATICACIDVEN in the last 168 hours. ? ?Recent Results (from the past 240 hour(s))  ?Resp Panel by RT-PCR (Flu A&B, Covid) Nasopharyngeal Swab     Status: None  ? Collection Time: 10/12/2021 12:59  PM  ? Specimen: Nasopharyngeal Swab; Nasopharyngeal(NP) swabs in vial transport medium  ?Result Value Ref Range Status  ? SARS Coronavirus 2 by RT PCR NEGATIVE NEGATIVE Final  ?  Comment: (NOTE) ?SARS-CoV-2 target nucleic acids are NOT DETECTED. ? ?The SARS-CoV-2 RNA is generally detectable in upper respiratory ?specimens during the acute phase of infection. The lowest ?concentration of SARS-CoV-2 viral copies this assay can detect is ?138 copies/mL. A negative result does not preclude SARS-Cov-2 ?infection and should not be used as the sole basis for treatment or ?other patient management decisions. A negative result may occur with  ?improper specimen collection/handling, submission of specimen other ?than nasopharyngeal swab, presence of viral mutation(s) within the ?areas targeted by this assay, and inadequate  number of viral ?copies(<138 copies/mL). A negative result must be combined with ?clinical observations, patient history, and epidemiological ?information. The expected result is Negative. ? ?Fact Sheet for Patients

## 2021-10-14 NOTE — Progress Notes (Signed)
PHARMACY NOTE:  ANTIMICROBIAL RENAL DOSAGE ADJUSTMENT ? ?Current antimicrobial regimen includes a mismatch between antimicrobial dosage and estimated renal function.  As per policy approved by the Pharmacy & Therapeutics and Medical Executive Committees, the antimicrobial dosage will be adjusted accordingly. ? ?Current antimicrobial dosage:  Unasyn 3 g IV q12h ? ?Indication: aspiration pneumonia ? ?Renal Function: ? ?Estimated Creatinine Clearance: 36.7 mL/min (A) (by C-G formula based on SCr of 1.7 mg/dL (H)). ?   ?Antimicrobial dosage has been changed to:  Unasyn 3 g IV q8h ? ? ?Thank you for allowing pharmacy to be a part of this patient's care. ? ?Tawnya Crook, PharmD, BCPS ?Clinical Pharmacist ?10/14/2021 11:38 AM ? ?

## 2021-10-14 NOTE — Progress Notes (Signed)
SLP Cancellation Note ? ?Patient Details ?Name: Jeanette Moffatt Nathanson ?MRN: [id number] ?DOB: [birth year] ? ? ?Cancelled treatment:       Reason Eval/Treat Not Completed: Other (comment) (PT had just walked pt to help with his mentation, agitation, He was finally in bed resting  - thus  was asked to defer SLP. Will continue efforts.) ?Kathleen Lime, MS CCC SLP ?Acute Rehab Services ?Office [phone] ?Pager [phone] ? ? ?[name] ?10/14/2021, 2:30 PM ?

## 2021-10-14 NOTE — TOC Progression Note (Signed)
Transition of Care (TOC) - Progression Note  ? ? ?Patient Details  ?Name: Kallon Caylor Stefanski ?MRN: [id number] ?Date of Birth: Jan 17, 1933 ? ?Transition of Care (TOC) CM/SW Contact  ?Trish Mage, LCSW ?Phone Number: ?10/14/2021, 12:15 PM ? ?Clinical Narrative:   Spoke with son about bed offers. He is interested in [place] and [facility], still waiting to hear if [facility] will make a bed offer. TOC will continue to follow during the course of hospitalization. ? ? ? ? ?Expected Discharge Plan: [place] ?Barriers to Discharge: SNF Pending bed offer ? ?Expected Discharge Plan and Services ?Expected Discharge Plan: [place] ?  ?Discharge Planning Services: CM Consult ?Post Acute Care Choice: [place] ?Living arrangements for the past 2 months: [place] ?                ?  ?  ?  ?  ?  ?  ?  ?  ?  ?  ? ? ?Social Determinants of Health (SDOH) Interventions ?  ? ?Readmission Risk Interventions ? ?  04/12/2021  ? 11:06 AM  ?Readmission Risk Prevention Plan  ?Transportation Screening Complete  ?PCP or Specialist Appt within 5-7 Days Complete  ?Home Care Screening Complete  ?Medication Review (RN CM) Complete  ? ? ?

## 2021-10-14 NOTE — Progress Notes (Signed)
Physical Therapy Treatment ?Patient Details ?Name: Luke [name] ?MRN: [id number] ?DOB: 23-Sep-1932 ?Today's Date: 10/14/2021 ? ? ?History of Present Illness This 88 years old male with PMH significant for COPD, CAD, hypertension, hyperlipidemia, CKD stage IIIb, hypothyroidism, diabetes mellitus type 2 presented in the ED with complaints of fall at home with head wound and dx with acute decompensated HFpEF and acute repiratory failure 2* heart failure ? ?  ?PT Comments  ? ? Pt very restless and irritable upon PT arrival to room, agreeable to OOB after multiple promptings. Pt ambulated x2 hallway distances with mod physical assist and max cuing for improving step length, upright balance, and RW use. Pt fatigues quickly and is not good at self-regulating rest breaks, requires PT intervention and assisted seated rest break to recover fatigue. Pt drowsy by end of session, returned to supine in much more restful state. Will continue to follow acutely.  ?   ?Recommendations for follow up therapy are one component of a multi-disciplinary discharge planning process, led by the attending physician.  Recommendations may be updated based on patient status, additional functional criteria and insurance authorization. ? ?Follow Up Recommendations ? Skilled nursing-short term rehab (<3 hours/day) ?  ?  ?Assistance Recommended at Discharge Frequent or constant Supervision/Assistance  ?Patient can return home with the following A lot of help with walking and/or transfers;A little help with bathing/dressing/bathroom;Assistance with cooking/housework;Assist for transportation;Help with stairs or ramp for entrance ?  ?Equipment Recommendations ? None recommended by PT  ?  ?Recommendations for Other Services   ? ? ?  ?Precautions / Restrictions Precautions ?Precautions: Fall ?Precaution Comments: monitor vitals ?Restrictions ?Weight Bearing Restrictions: No  ?  ? ?Mobility ? Bed Mobility ?Overal bed mobility: Needs Assistance ?Bed  Mobility: Supine to Sit, Sit to Supine ?  ?  ?Supine to sit: Min assist ?Sit to supine: Mod assist, +2 for physical assistance ?  ?General bed mobility comments: assist for trunk rise when moving supine>sit, trunk lower and LE lifting into bed upon return to supine. ?  ? ?Transfers ?Overall transfer level: Needs assistance ?Equipment used: Rolling walker (2 wheels) ?Transfers: Sit to/from Stand ?Sit to Stand: Mod assist, +2 safety/equipment ?  ?  ?  ?  ?  ?General transfer comment: assist for power up, rise, steadying. Close +2 in case of LOB. STS x3, from EOB, recliner x2. ?  ? ?Ambulation/Gait ?Ambulation/Gait assistance: Mod assist, +2 physical assistance, +2 safety/equipment ?Gait Distance (Feet): 100 Feet (+100 (seated rest break x2 minutes to recover fatigue)) ?Assistive device: Rolling walker (2 wheels) ?Gait Pattern/deviations: Step-through pattern, Shuffle, Trunk flexed, Decreased stride length ?Gait velocity: decr ?  ?  ?General Gait Details: mod assist for correcting heavy R bias, navigating pt and RW in hallway, steadying, supporting bodyweight when fatigues. on 4LO2 throughout mobility, no DOE. Requires PT and PT student to break pt at hips for seated rest break with close recliner follow, unsafe to continue to walk with LEs buckling and pt declined stopping. ? ? ?Stairs ?  ?  ?  ?  ?  ? ? ?Wheelchair Mobility ?  ? ?Modified Rankin (Stroke Patients Only) ?  ? ? ?  ?Balance Overall balance assessment: Needs assistance ?Sitting-balance support: Feet supported, No upper extremity supported ?Sitting balance-Leahy Scale: Fair ?  ?  ?Standing balance support: Bilateral upper extremity supported ?Standing balance-Leahy Scale: Poor ?Standing balance comment: reliant on external support ?  ?  ?  ?  ?  ?  ?  ?  ?  ?  ?  ?  ? ?  ?  Cognition Arousal/Alertness: Awake/alert ?Behavior During Therapy: Restless, Agitated ?Overall Cognitive Status: History of cognitive impairments - at baseline ?  ?  ?  ?  ?  ?  ?  ?  ?   ?  ?  ?  ?  ?  ?  ?  ?General Comments: pt oriented to self only, states he is at home and is very restless/resisting RN assist upon PT arrival to room. Pt contradicts self often, states he wants to get up but when PT attempts to assist pt states he doesn't want to get up. Follows functional cues and gestures ("walk to the door") better. Pt motivated by idea of wife visiting, she visits daily ?  ?  ? ?  ?Exercises   ? ?  ?General Comments   ?  ?  ? ?Pertinent Vitals/Pain Pain Assessment ?Pain Assessment: Faces ?Faces Pain Scale: No hurt ?Pain Intervention(s): Monitored during session  ? ? ?Home Living   ?  ?  ?  ?  ?  ?  ?  ?  ?  ?   ?  ?Prior Function    ?  ?  ?   ? ?PT Goals (current goals can now be found in the care plan section) Acute Rehab PT Goals ?Patient Stated Goal: Regain IND and go HOME ?PT Goal Formulation: With patient ?Time For Goal Achievement: 10/24/21 ?Potential to Achieve Goals: Fair ?Progress towards PT goals: Progressing toward goals ? ?  ?Frequency ? ? ? Min 2X/week ? ? ? ?  ?PT Plan Current plan remains appropriate  ? ? ?Co-evaluation   ?  ?  ?  ?  ? ?  ?AM-PAC PT "6 Clicks" Mobility   ?Outcome Measure ? Help needed turning from your back to your side while in a flat bed without using bedrails?: A Little ?Help needed moving from lying on your back to sitting on the side of a flat bed without using bedrails?: A Little ?Help needed moving to and from a bed to a chair (including a wheelchair)?: A Lot ?Help needed standing up from a chair using your arms (e.g., wheelchair or bedside chair)?: A Lot ?Help needed to walk in hospital room?: A Lot ?Help needed climbing 3-5 steps with a railing? : Total ?6 Click Score: 13 ? ?  ?End of Session Equipment Utilized During Treatment: Gait belt;Oxygen ?Activity Tolerance: Patient limited by fatigue ?Patient left: with call bell/phone within reach;in bed;with bed alarm set ?Nurse Communication: Mobility status ?PT Visit Diagnosis: Unsteadiness on feet  (R26.81);Muscle weakness (generalized) (M62.81);Difficulty in walking, not elsewhere classified (R26.2);History of falling (Z91.81) ?  ? ? ?Time: 3154-0086 ?PT Time Calculation (min) (ACUTE ONLY): 29 min ? ?Charges:  $Gait Training: 23-37 mins          ?          ? ?Stacie Glaze, PT DPT ?Acute Rehabilitation Services ?Pager [phone]  ?Office [phone] ? ? ? ?Paw Karstens E Stroup ?10/14/2021, 2:19 PM ? ?

## 2021-10-15 DIAGNOSIS — I5021 Acute systolic (congestive) heart failure: Secondary | ICD-10-CM | POA: Diagnosis not present

## 2021-10-15 LAB — CBC WITH DIFFERENTIAL/PLATELET
Abs Immature Granulocytes: 0.01 10*3/uL (ref 0.00–0.07)
Basophils Absolute: 0 10*3/uL (ref 0.0–0.1)
Basophils Relative: 1 %
Eosinophils Absolute: 0.2 10*3/uL (ref 0.0–0.5)
Eosinophils Relative: 5 %
HCT: 39.9 % (ref 39.0–52.0)
Hemoglobin: 12.1 g/dL — ABNORMAL LOW (ref 13.0–17.0)
Immature Granulocytes: 0 %
Lymphocytes Relative: 15 %
Lymphs Abs: 0.5 10*3/uL — ABNORMAL LOW (ref 0.7–4.0)
MCH: 30 pg (ref 26.0–34.0)
MCHC: 30.3 g/dL (ref 30.0–36.0)
MCV: 99 fL (ref 80.0–100.0)
Monocytes Absolute: 0.6 10*3/uL (ref 0.1–1.0)
Monocytes Relative: 17 %
Neutro Abs: 2.3 10*3/uL (ref 1.7–7.7)
Neutrophils Relative %: 62 %
Platelets: 123 10*3/uL — ABNORMAL LOW (ref 150–400)
RBC: 4.03 MIL/uL — ABNORMAL LOW (ref 4.22–5.81)
RDW: 12.9 % (ref 11.5–15.5)
WBC: 3.6 10*3/uL — ABNORMAL LOW (ref 4.0–10.5)
nRBC: 0 % (ref 0.0–0.2)

## 2021-10-15 LAB — GLUCOSE, CAPILLARY
Glucose-Capillary: 106 mg/dL — ABNORMAL HIGH (ref 70–99)
Glucose-Capillary: 108 mg/dL — ABNORMAL HIGH (ref 70–99)
Glucose-Capillary: 111 mg/dL — ABNORMAL HIGH (ref 70–99)
Glucose-Capillary: 115 mg/dL — ABNORMAL HIGH (ref 70–99)
Glucose-Capillary: 123 mg/dL — ABNORMAL HIGH (ref 70–99)
Glucose-Capillary: 125 mg/dL — ABNORMAL HIGH (ref 70–99)
Glucose-Capillary: 158 mg/dL — ABNORMAL HIGH (ref 70–99)

## 2021-10-15 LAB — BLOOD GAS, ARTERIAL
Acid-Base Excess: 6.7 mmol/L — ABNORMAL HIGH (ref 0.0–2.0)
Bicarbonate: 35 mmol/L — ABNORMAL HIGH (ref 20.0–28.0)
Delivery systems: POSITIVE
FIO2: 35 %
O2 Saturation: 99.3 %
Patient temperature: 36.9
pCO2 arterial: 68 mmHg (ref 32–48)
pH, Arterial: 7.32 — ABNORMAL LOW (ref 7.35–7.45)
pO2, Arterial: 105 mmHg (ref 83–108)

## 2021-10-15 LAB — BASIC METABOLIC PANEL
Anion gap: 5 (ref 5–15)
BUN: 59 mg/dL — ABNORMAL HIGH (ref 8–23)
CO2: 33 mmol/L — ABNORMAL HIGH (ref 22–32)
Calcium: 8.5 mg/dL — ABNORMAL LOW (ref 8.9–10.3)
Chloride: 103 mmol/L (ref 98–111)
Creatinine, Ser: 1.41 mg/dL — ABNORMAL HIGH (ref 0.61–1.24)
GFR, Estimated: 49 mL/min — ABNORMAL LOW (ref >60)
Glucose, Bld: 115 mg/dL — ABNORMAL HIGH (ref 70–99)
Potassium: 3.7 mmol/L (ref 3.5–5.1)
Sodium: 141 mmol/L (ref 135–145)

## 2021-10-15 LAB — MAGNESIUM: Magnesium: 2.8 mg/dL — ABNORMAL HIGH (ref 1.7–2.4)

## 2021-10-15 LAB — PHOSPHORUS: Phosphorus: 4.2 mg/dL (ref 2.5–4.6)

## 2021-10-15 MED ORDER — HALOPERIDOL LACTATE 5 MG/ML IJ SOLN
2.0000 mg | Freq: Four times a day (QID) | INTRAMUSCULAR | Status: DC | PRN
  Administered 2021-10-15 – 2021-10-20 (×9): 2 mg via INTRAVENOUS
  Filled 2021-10-15 (×10): qty 1

## 2021-10-15 MED ORDER — HALOPERIDOL LACTATE 5 MG/ML IJ SOLN
5.0000 mg | Freq: Once | INTRAMUSCULAR | Status: AC
  Administered 2021-10-15: 5 mg via INTRAMUSCULAR

## 2021-10-15 MED ORDER — SODIUM CHLORIDE 0.9% FLUSH: 10.0000 mL | INTRAVENOUS | Status: DC | PRN

## 2021-10-15 MED ORDER — LORAZEPAM 2 MG/ML IJ SOLN
1.0000 mg | Freq: Once | INTRAMUSCULAR | Status: AC
  Administered 2021-10-15: 1 mg via INTRAVENOUS
  Filled 2021-10-15: qty 1

## 2021-10-15 MED ORDER — LORAZEPAM 2 MG/ML IJ SOLN: 0.5000 mg | Freq: Four times a day (QID) | INTRAMUSCULAR | Status: DC | PRN

## 2021-10-15 MED ORDER — QUETIAPINE 12.5 MG HALF TABLET
12.5000 mg | ORAL_TABLET | Freq: Two times a day (BID) | ORAL | Status: DC
  Administered 2021-10-15 – 2021-10-18 (×6): 12.5 mg via ORAL
  Filled 2021-10-15 (×7): qty 1

## 2021-10-15 MED ORDER — FUROSEMIDE 10 MG/ML IJ SOLN
4.0000 mg/h | INTRAVENOUS | Status: DC
  Administered 2021-10-15 – 2021-10-16 (×3): 4 mg/h via INTRAVENOUS
  Filled 2021-10-15 (×5): qty 20

## 2021-10-15 MED ORDER — SODIUM CHLORIDE 0.9% FLUSH
10.0000 mL | Freq: Two times a day (BID) | INTRAVENOUS | Status: DC
  Administered 2021-10-15 – 2021-10-20 (×11): 10 mL

## 2021-10-15 MED ORDER — LORAZEPAM 2 MG/ML IJ SOLN
0.5000 mg | INTRAMUSCULAR | Status: DC | PRN
  Administered 2021-10-15 – 2021-10-18 (×6): 0.5 mg via INTRAVENOUS
  Filled 2021-10-15 (×7): qty 1

## 2021-10-15 MED ORDER — HALOPERIDOL LACTATE 5 MG/ML IJ SOLN
2.0000 mg | Freq: Once | INTRAMUSCULAR | Status: AC
  Administered 2021-10-15: 2 mg via INTRAMUSCULAR
  Filled 2021-10-15: qty 1

## 2021-10-15 NOTE — Care Plan (Signed)
Goal of care discussion. ? ?Met with patient's son and wife at the bedside multiple times.  Discussed with patient's son about ongoing issues, difficult to treat conditions, delirium and confusion, agitation requiring Haldol and Ativan.  Also discussed about overall clinical status, unknown outcome. ?Patient's son is very understandable, he is aware about patient's sickness and is also concerned that patient may not survive this hospitalization.  Patient does have wishes for comfort care and hospice if it comes to untreatable or difficult to treat conditions. ? ?Plan of care: ?- Continue reasonable attempt with BiPAP, oxygen, medication trial including Lasix drip and look for response to treatment. ?-If any decompensation, discomfort.  Will call patient's son and we will discuss about starting comfort care and hospice approach. ?-If clinical stabilization, will pursue a SNF placement. ? ? ?

## 2021-10-15 NOTE — Progress Notes (Signed)

## 2021-10-15 NOTE — Progress Notes (Signed)
?PROGRESS NOTE ? ? ? ?Luke [name]  GLO:[id number] DOB: August 28, 1932 DOA: 09/26/2021 ?PCP: Aretta Nip, MD  ? ? ?Brief Narrative:  ?88 year old gentleman from home with history of coronary artery disease status post CABG, LAD with stenting, chronic diastolic heart failure, type 2 diabetes, CKD stage II with baseline creatinine about 1.6, hypertension, hyperlipidemia, COPD and mild cognitive impairment with recent multiple hospitalization admitted to hospital, falling at home and confusion.  In the emergency room he was 88% on room air.  Recently visited cardiology office with 13 pound weight gain and was continued on Lasix. ?Remains in the hospital with multifactorial respiratory failure, encephalopathy. ? ? ?Assessment & Plan: ?  ?Acute metabolic encephalopathy suspect multifactorial, likely hospital-acquired delirium/hypercapnic respiratory failure: ?Agitation and delirium started 3/20. Not on any sedative medications.  Continue monitoring.  Nonfocal exam.   ?On admission CT head and C-spine on admission were essentially normal. ?3/23, repeat CT head essentially normal.  Too much uncomfortable to do MRI.  However nonfocal exam. ?Patient continues to be delirious, agitated and wanting to go home. ?Soft restraints as needed to protect from injury and protect IV lines. ?We will start patient on Seroquel 25 mg twice daily, Haldol 2 mg IV every 6 hours as needed. ? ?Multifactorial acute hypoxemic and hypercapnic respiratory failure: ?- Aspiration pneumonia, chest x-ray 3/22 with worsening asymmetrical opacity right lower lobe.  On Unasyn.   ?-Probable undiagnosed obstructive sleep apnea with COPD: Presentation ABG with CO2 69, CO2 74 and Ph 7.28. Keep on oxygen to keep saturation more than 90%.  ?We will try to keep on BiPAP as much as possible.  Patient is DNI.  ?Bronchodilator therapy. ?-Acute on chronic congestive heart failure with preserved ejection fraction:  ?Patient initially received bolus Lasix  causing renal failure.  Given received isotonic fluid for 24 hours.  Urine output is adequate.  Renal functions trending towards normal.  We will start patient on Lasix drip 40 mg/h, intake output monitoring.  Daily weight.   ?Cardiology was following, they signed off.  Patient on metoprolol.  Patient is on metolazone that was continued. ? ?Acute kidney injury due to ATN superimposed on CKD stage II: Presentation creatinine was 1.07.  Peaked to 2.18 and trending down today.  Urine output more than 667m.  Potassium is adequate.  Unlikely uremia. ?Continue intake and output monitoring.  Recheck levels tomorrow. ? ?Type 2 diabetes, well controlled with hyperglycemia: Previous episodes of hypoglycemia.  Keeping blood sugars more than 150.  Liberalize diet as he does not have any good intake.  Blood sugars adequately controlled today on current regimen. ? ?Goal of care: ?Palliative care following.  DNR/DNI.  Current plan is to try to stabilize and transfer to SNF for rehab. ?If he does not do appropriate clinical improvement, may need to discuss about hospice.   ?3/24, patient is more agitated but awake and confused, trying to go home.  Updated patient's son at the bedside.  Wife was present but she has dementia. ? ? ? ?DVT prophylaxis: enoxaparin (LOVENOX) injection 40 mg Start: 10/14/21 2200 ? ? ?Code Status: DNR/DNI  ?Family Communication: Son and wife at the bedside. ?Disposition Plan: Status is: Inpatient ?Remains inpatient appropriate because: Altered mental status ?  ? ? ?Consultants:  ?Cardiology, signed off ?Palliative medicine ? ?Procedures:  ?None ? ?Antimicrobials:  ?Unasyn 3/22--- ? ? ?Subjective: ? ?Patient seen and examined.  Overnight he did pretty well and tolerated BiPAP.  Early morning, he started getting more agitated, getting out of  the bed and wanting to go home.  Remains on minimal oxygen. ?Confused, unable to explain any symptoms. ? ?Objective: ?Vitals:  ? 10/15/21 0500 10/15/21 0600 10/15/21  0812 10/15/21 0917  ?BP:  (!) 150/48    ?Pulse:  70 74 73  ?Resp:  16 19 (!) 26  ?Temp:   97.8 ?F (36.6 ?C)   ?TempSrc:   Oral   ?SpO2:  98% 97% 100%  ?Weight: 95.9 kg     ?Height:      ? ? ?Intake/Output Summary (Last 24 hours) at 10/15/2021 1125 ?Last data filed at 10/15/2021 0400 ?Gross per 24 hour  ?Intake 215.39 ml  ?Output 750 ml  ?Net -534.61 ml  ? ?Filed Weights  ? 10/16/2021 2028 10/12/21 0643 10/15/21 0500  ?Weight: 95.5 kg 95.1 kg 95.9 kg  ? ? ?Examination: ? ?General exam: Sick looking.  Frail and debilitated.  Earlier on BiPAP.  Now on nasal cannula oxygen, ripping off IV lines and BiPAP.  Looks uncomfortable, anxious and agitated. ?Respiratory system: Mostly conducted airway sounds. ?Cardiovascular system: S1 & S2 heard, RRR.  Trace edema left.  No edema on the right leg. ?Gastrointestinal system: Abdomen is nondistended, soft and nontender. No organomegaly or masses felt. Normal bowel sounds heard. ?Central nervous system: Moves all extremities. ?Extremities: Symmetric 5 x 5 power. ? ? ? ?Data Reviewed: I have personally reviewed following labs and imaging studies ? ?CBC: ?Recent Labs  ?Lab 09/23/2021 ?1256 10/10/21 ?0300 10/11/21 ?0258 10/13/21 ?0304 10/14/21 ?0244 10/15/21 ?0241  ?WBC 5.5 5.2 4.6 4.6 5.5 3.6*  ?NEUTROABS 4.1  --  3.8 2.7 3.9 2.3  ?HGB 13.4 14.0 12.8* 12.5* 12.1* 12.1*  ?HCT 42.3 45.2 41.2 41.3 39.8 39.9  ?MCV 96.4 97.8 96.7 99.3 99.7 99.0  ?PLT 131* 139* 117* 114* 128* 123*  ? ?Basic Metabolic Panel: ?Recent Labs  ?Lab 10/11/21 ?0258 10/12/21 ?0229 10/13/21 ?0304 10/14/21 ?9735 10/15/21 ?0241  ?NA 137 136 137 138 141  ?K 4.1 3.9 4.0 3.7 3.7  ?CL 94* 93* 94* 99 103  ?CO2 38* 36* 34* 32 33*  ?GLUCOSE 117* 156* 146* 144* 115*  ?BUN 40* 54* 65* 61* 59*  ?CREATININE 1.71* 1.96* 2.18* 1.70* 1.41*  ?CALCIUM 8.3* 8.0* 8.1* 8.0* 8.5*  ?MG 2.2  --  2.7* 2.6* 2.8*  ?PHOS  --   --   --   --  4.2  ? ?GFR: ?Estimated Creatinine Clearance: 44.4 mL/min (A) (by C-G formula based on SCr of 1.41 mg/dL  (H)). ?Liver Function Tests: ?Recent Labs  ?Lab 09/28/2021 ?1256 10/10/21 ?0300 10/11/21 ?0258 10/13/21 ?0304 10/14/21 ?3299  ?AST '18 19 21 22 18  '$ ?ALT '14 13 12 15 15  '$ ?ALKPHOS 43 46 39 46 42  ?BILITOT 0.7 0.8 0.8 0.8 0.6  ?PROT 6.3* 6.8 6.1* 6.2* 5.9*  ?ALBUMIN 3.8 4.0 3.7 3.6 3.6  ? ?No results for input(s): LIPASE, AMYLASE in the last 168 hours. ?No results for input(s): AMMONIA in the last 168 hours. ?Coagulation Profile: ?No results for input(s): INR, PROTIME in the last 168 hours. ?Cardiac Enzymes: ?No results for input(s): CKTOTAL, CKMB, CKMBINDEX, TROPONINI in the last 168 hours. ?BNP (last 3 results) ?No results for input(s): PROBNP in the last 8760 hours. ?HbA1C: ?No results for input(s): HGBA1C in the last 72 hours. ?CBG: ?Recent Labs  ?Lab 10/14/21 ?1548 10/14/21 ?1932 10/15/21 ?0000 10/15/21 ?2426 10/15/21 ?0825  ?GLUCAP 145* 121* 123* 115* 108*  ? ?Lipid Profile: ?No results for input(s): CHOL, HDL, LDLCALC, TRIG, CHOLHDL, LDLDIRECT in  the last 72 hours. ?Thyroid Function Tests: ?No results for input(s): TSH, T4TOTAL, FREET4, T3FREE, THYROIDAB in the last 72 hours. ?Anemia Panel: ?No results for input(s): VITAMINB12, FOLATE, FERRITIN, TIBC, IRON, RETICCTPCT in the last 72 hours. ?Sepsis Labs: ?No results for input(s): PROCALCITON, LATICACIDVEN in the last 168 hours. ? ?Recent Results (from the past 240 hour(s))  ?Resp Panel by RT-PCR (Flu A&B, Covid) Nasopharyngeal Swab     Status: None  ? Collection Time: 10/12/2021 12:59 PM  ? Specimen: Nasopharyngeal Swab; Nasopharyngeal(NP) swabs in vial transport medium  ?Result Value Ref Range Status  ? SARS Coronavirus 2 by RT PCR NEGATIVE NEGATIVE Final  ?  Comment: (NOTE) ?SARS-CoV-2 target nucleic acids are NOT DETECTED. ? ?The SARS-CoV-2 RNA is generally detectable in upper respiratory ?specimens during the acute phase of infection. The lowest ?concentration of SARS-CoV-2 viral copies this assay can detect is ?138 copies/mL. A negative result does not preclude  SARS-Cov-2 ?infection and should not be used as the sole basis for treatment or ?other patient management decisions. A negative result may occur with  ?improper specimen collection/handling, submission of specimen o

## 2021-10-15 NOTE — Plan of Care (Signed)
?  Problem: Education: ?Goal: Ability to demonstrate management of disease process will improve ?Outcome: Not Progressing ?Goal: Ability to verbalize understanding of medication therapies will improve ?Outcome: Not Progressing ?Goal: Individualized Educational Video(s) ?Outcome: Not Applicable ?  ?Problem: Activity: ?Goal: Capacity to carry out activities will improve ?Outcome: Not Progressing ?  ?Problem: Cardiac: ?Goal: Ability to achieve and maintain adequate cardiopulmonary perfusion will improve ?Outcome: Progressing ?  ?Problem: Education: ?Goal: Knowledge of disease or condition will improve ?Outcome: Not Progressing ?Goal: Knowledge of the prescribed therapeutic regimen will improve ?Outcome: Not Progressing ?Goal: Individualized Educational Video(s) ?Outcome: Not Applicable ?  ?Problem: Activity: ?Goal: Ability to tolerate increased activity will improve ?Outcome: Progressing ?Goal: Will verbalize the importance of balancing activity with adequate rest periods ?Outcome: Not Progressing ?  ?Problem: Respiratory: ?Goal: Ability to maintain a clear airway will improve ?Outcome: Progressing ?Goal: Levels of oxygenation will improve ?Outcome: Progressing ?Goal: Ability to maintain adequate ventilation will improve ?Outcome: Progressing ?  ?Problem: Safety: ?Goal: Non-violent Restraint(s) ?Outcome: Completed/Met ?  ?

## 2021-10-15 NOTE — Evaluation (Addendum)
SLP Cancellation Note ? ?Patient Details ?Name: Luke [name] ?MRN: [id number] ?DOB: 1932/09/26 ? ? ?Cancelled treatment:       Reason Eval/Treat Not Completed: Other (comment);Patient not medically ready (pt currently on Bipap due to CO2 retention, agitation, will continue efforts)    ? ?Per RN, MD speaking to pt regarding plan of care.  Consumed breakfast without overt s/s of aspiration per RN, [name].   ?Will continue efforts.  ? ?Kathleen Lime, MS [facility] SLP ?Acute Rehab Services ?Office [phone] ?Pager [phone] ? ?[name] ?10/15/2021, 4:05 PM ? ? ? ?

## 2021-10-16 DIAGNOSIS — I5021 Acute systolic (congestive) heart failure: Secondary | ICD-10-CM | POA: Diagnosis not present

## 2021-10-16 DIAGNOSIS — F05 Delirium due to known physiological condition: Secondary | ICD-10-CM | POA: Diagnosis not present

## 2021-10-16 DIAGNOSIS — R0902 Hypoxemia: Secondary | ICD-10-CM | POA: Diagnosis not present

## 2021-10-16 DIAGNOSIS — Z7189 Other specified counseling: Secondary | ICD-10-CM | POA: Diagnosis not present

## 2021-10-16 LAB — GLUCOSE, CAPILLARY
Glucose-Capillary: 102 mg/dL — ABNORMAL HIGH (ref 70–99)
Glucose-Capillary: 103 mg/dL — ABNORMAL HIGH (ref 70–99)
Glucose-Capillary: 114 mg/dL — ABNORMAL HIGH (ref 70–99)
Glucose-Capillary: 77 mg/dL (ref 70–99)
Glucose-Capillary: 83 mg/dL (ref 70–99)
Glucose-Capillary: 85 mg/dL (ref 70–99)

## 2021-10-16 LAB — CBC WITH DIFFERENTIAL/PLATELET
Abs Immature Granulocytes: 0.01 10*3/uL (ref 0.00–0.07)
Basophils Absolute: 0 10*3/uL (ref 0.0–0.1)
Basophils Relative: 1 %
Eosinophils Absolute: 0.2 10*3/uL (ref 0.0–0.5)
Eosinophils Relative: 6 %
HCT: 40.4 % (ref 39.0–52.0)
Hemoglobin: 12.6 g/dL — ABNORMAL LOW (ref 13.0–17.0)
Immature Granulocytes: 0 %
Lymphocytes Relative: 16 %
Lymphs Abs: 0.5 10*3/uL — ABNORMAL LOW (ref 0.7–4.0)
MCH: 30.7 pg (ref 26.0–34.0)
MCHC: 31.2 g/dL (ref 30.0–36.0)
MCV: 98.5 fL (ref 80.0–100.0)
Monocytes Absolute: 0.5 10*3/uL (ref 0.1–1.0)
Monocytes Relative: 15 %
Neutro Abs: 1.9 10*3/uL (ref 1.7–7.7)
Neutrophils Relative %: 62 %
Platelets: 127 10*3/uL — ABNORMAL LOW (ref 150–400)
RBC: 4.1 MIL/uL — ABNORMAL LOW (ref 4.22–5.81)
RDW: 13.1 % (ref 11.5–15.5)
WBC: 3.1 10*3/uL — ABNORMAL LOW (ref 4.0–10.5)
nRBC: 0 % (ref 0.0–0.2)

## 2021-10-16 LAB — BASIC METABOLIC PANEL
Anion gap: 6 (ref 5–15)
BUN: 57 mg/dL — ABNORMAL HIGH (ref 8–23)
CO2: 33 mmol/L — ABNORMAL HIGH (ref 22–32)
Calcium: 8.5 mg/dL — ABNORMAL LOW (ref 8.9–10.3)
Chloride: 105 mmol/L (ref 98–111)
Creatinine, Ser: 1.39 mg/dL — ABNORMAL HIGH (ref 0.61–1.24)
GFR, Estimated: 49 mL/min — ABNORMAL LOW (ref >60)
Glucose, Bld: 119 mg/dL — ABNORMAL HIGH (ref 70–99)
Potassium: 3.5 mmol/L (ref 3.5–5.1)
Sodium: 144 mmol/L (ref 135–145)

## 2021-10-16 NOTE — Progress Notes (Signed)
Severely agitated this morning, following commands, not answering questions appropriately. Patient given haldol IV to prevent pulling out lines and placed in mittens bilaterally. Bladder scan performed reading 238 residual urine after patient soaked the bedpan with urine. Patient cleaned, new primofit placed, morning meds held due to confusion/agitation. Patient placed on bipap instead. MD notified, will continue to await orders.  ?

## 2021-10-16 NOTE — H&P (Signed)
RN took Patient off of bipap at this time for a break. ?

## 2021-10-16 NOTE — Progress Notes (Signed)
?PROGRESS NOTE ? ? ? ?Luke [name]  AGT:[id number] DOB: Mar 30, 1933 DOA: 10/02/2021 ?PCP: Aretta Nip, MD  ? ? ?Brief Narrative:  ?88 year old gentleman from home with history of coronary artery disease status post CABG, LAD with stenting, chronic diastolic heart failure, type 2 diabetes, CKD stage II with baseline creatinine about 1.6, hypertension, hyperlipidemia, COPD and mild cognitive impairment with recent multiple hospitalization admitted to hospital, falling at home and confusion.  In the emergency room he was 88% on room air.  Recently visited cardiology office with 13 pound weight gain and was continued on Lasix. ?Remains in the hospital with multifactorial respiratory failure, encephalopathy. ? ?10/16/2021: Patient seen.  Also discussed with the patient's nurse.  Mild improvement in renal function noted.  Patient does not look severely volume overloaded.  Patient remains agitated and delirious.  No significant history from patient. ? ? ?Assessment & Plan: ?Acute metabolic encephalopathy: ?-Likely multifactorial.   ?-Patient has chronic versus acute on chronic combined hypoxic and hypercapnic respiratory failure. ?-Discontinue acetazolamide.  Current serum CO2 is likely compensatory. ?-On admission CT head and C-spine on admission were essentially normal. ?3/23, repeat CT head essentially normal.  Too much uncomfortable to do MRI.  However nonfocal exam. ?Patient continues to be delirious, agitated  ?-Continue quetiapine.  If patient does not take orally, consider sublingual olanzapine. ?-Continue intermittent very low-dose Haldol, but patient will need telemetry monitoring.   ? ?Multifactorial acute hypoxemic and hypercapnic respiratory failure: ?- Aspiration pneumonia, chest x-ray 3/22 with worsening asymmetrical opacity right lower lobe.  On Unasyn.   ?-Probable undiagnosed obstructive sleep apnea with COPD: Presentation ABG with CO2 69, CO2 74 and Ph 7.28. Keep on oxygen to keep saturation more  than 90%.  ?We will try to keep on BiPAP as much as possible.  Patient is DNI.  ?Bronchodilator therapy. ?-Acute on chronic congestive heart failure with preserved ejection fraction:  ?Patient initially received bolus Lasix causing renal failure.  Given received isotonic fluid for 24 hours.  Urine output is adequate.  Renal functions trending towards normal.  We will start patient on Lasix drip 40 mg/h, intake output monitoring.  Daily weight.   ?Cardiology was following, they signed off.  Patient on metoprolol.  Patient is on metolazone that was continued. ?10/16/2021: Cautious use of Lasix drip.  Discontinue acetazolamide.  Continue to assess closely. ? ?Acute kidney injury due to ATN superimposed on CKD stage II:  ?-Presentation creatinine was 1.07.  Peaked to 2.18 and trending down today.  Urine output more than 650m.  Potassium is adequate.  Unlikely uremia. ?Continue intake and output monitoring.  Recheck levels tomorrow. ?10/16/2021: AKI slowly improving.  Possible cardiorenal component. ? ?Type 2 diabetes, well controlled with hyperglycemia: Previous episodes of hypoglycemia.  Keeping blood sugars more than 150.  Liberalize diet as he does not have any good intake.  Blood sugars adequately controlled today on current regimen. ? ?Goal of care: ?Palliative care following.  DNR/DNI.  Current plan is to try to stabilize and transfer to SNF for rehab. ?If he does not do appropriate clinical improvement, may need to discuss about hospice.   ?3/24, patient is more agitated but awake and confused, trying to go home.  Updated patient's son at the bedside.  Wife was present but she has dementia. ? ? ? ?DVT prophylaxis: enoxaparin (LOVENOX) injection 40 mg Start: 10/14/21 2200 ? ? ?Code Status: DNR/DNI  ?Family Communication: Son and wife at the bedside. ?Disposition Plan: Status is: Inpatient ?Remains inpatient appropriate because: Altered  mental status ?  ? ? ?Consultants:  ?Cardiology, signed off ?Palliative  medicine ? ?Procedures:  ?None ? ?Antimicrobials:  ?Unasyn 3/22--- ? ? ?Subjective: ?Patient seen.  No history from patient. ?Patient remains agitated ? ?Objective: ?Vitals:  ? 10/16/21 0700 10/16/21 0800 10/16/21 0920 10/16/21 1014  ?BP: (!) 158/39 (!) 155/23    ?Pulse: 74 78 78   ?Resp: 17 (!) 25 (!) 25   ?Temp:    98.3 ?F (36.8 ?C)  ?TempSrc:    Axillary  ?SpO2: 96% 98% 98%   ?Weight:      ?Height:      ? ? ?Intake/Output Summary (Last 24 hours) at 10/16/2021 1044 ?Last data filed at 10/16/2021 6160 ?Gross per 24 hour  ?Intake 342.21 ml  ?Output 2300 ml  ?Net -1957.79 ml  ? ? ?Filed Weights  ? 10/12/21 0643 10/15/21 0500 10/16/21 0353  ?Weight: 95.1 kg 95.9 kg 95 kg  ? ? ?Examination: ? ?General exam: Agitated.  No significant history from patient. ?HEENT: Patient is pale.  No jaundice. ?Neck: Supple.  JVD is difficult to assess. ?Lungs: Decreased air entry. ?CVS: S1-S2. ?Abdomen: Soft and nontender. ?Neuro: Agitated. ?Extremities fullness of the ankle.  . ? ? ? ?Data Reviewed: I have personally reviewed following labs and imaging studies ? ?CBC: ?Recent Labs  ?Lab 10/11/21 ?0258 10/13/21 ?0304 10/14/21 ?7371 10/15/21 ?0626 10/16/21 ?9485  ?WBC 4.6 4.6 5.5 3.6* 3.1*  ?NEUTROABS 3.8 2.7 3.9 2.3 1.9  ?HGB 12.8* 12.5* 12.1* 12.1* 12.6*  ?HCT 41.2 41.3 39.8 39.9 40.4  ?MCV 96.7 99.3 99.7 99.0 98.5  ?PLT 117* 114* 128* 123* 127*  ? ? ?Basic Metabolic Panel: ?Recent Labs  ?Lab 10/11/21 ?4627 10/12/21 ?0350 10/13/21 ?0304 10/14/21 ?0938 10/15/21 ?1829 10/16/21 ?9371  ?NA 137 136 137 138 141 144  ?K 4.1 3.9 4.0 3.7 3.7 3.5  ?CL 94* 93* 94* 99 103 105  ?CO2 38* 36* 34* 32 33* 33*  ?GLUCOSE 117* 156* 146* 144* 115* 119*  ?BUN 40* 54* 65* 61* 59* 57*  ?CREATININE 1.71* 1.96* 2.18* 1.70* 1.41* 1.39*  ?CALCIUM 8.3* 8.0* 8.1* 8.0* 8.5* 8.5*  ?MG 2.2  --  2.7* 2.6* 2.8*  --   ?PHOS  --   --   --   --  4.2  --   ? ? ?GFR: ?Estimated Creatinine Clearance: 44.9 mL/min (A) (by C-G formula based on SCr of 1.39 mg/dL (H)). ?Liver  Function Tests: ?Recent Labs  ?Lab 10/13/2021 ?1256 10/10/21 ?0300 10/11/21 ?0258 10/13/21 ?0304 10/14/21 ?6967  ?AST '18 19 21 22 18  '$ ?ALT '14 13 12 15 15  '$ ?ALKPHOS 43 46 39 46 42  ?BILITOT 0.7 0.8 0.8 0.8 0.6  ?PROT 6.3* 6.8 6.1* 6.2* 5.9*  ?ALBUMIN 3.8 4.0 3.7 3.6 3.6  ? ? ?No results for input(s): LIPASE, AMYLASE in the last 168 hours. ?No results for input(s): AMMONIA in the last 168 hours. ?Coagulation Profile: ?No results for input(s): INR, PROTIME in the last 168 hours. ?Cardiac Enzymes: ?No results for input(s): CKTOTAL, CKMB, CKMBINDEX, TROPONINI in the last 168 hours. ?BNP (last 3 results) ?No results for input(s): PROBNP in the last 8760 hours. ?HbA1C: ?No results for input(s): HGBA1C in the last 72 hours. ?CBG: ?Recent Labs  ?Lab 10/15/21 ?1721 10/15/21 ?2008 10/15/21 ?2353 10/16/21 ?8938 10/16/21 ?1017  ?GLUCAP 125* 111* 106* 114* 102*  ? ? ?Lipid Profile: ?No results for input(s): CHOL, HDL, LDLCALC, TRIG, CHOLHDL, LDLDIRECT in the last 72 hours. ?Thyroid Function Tests: ?No results for input(s):  TSH, T4TOTAL, FREET4, T3FREE, THYROIDAB in the last 72 hours. ?Anemia Panel: ?No results for input(s): VITAMINB12, FOLATE, FERRITIN, TIBC, IRON, RETICCTPCT in the last 72 hours. ?Sepsis Labs: ?No results for input(s): PROCALCITON, LATICACIDVEN in the last 168 hours. ? ?Recent Results (from the past 240 hour(s))  ?Resp Panel by RT-PCR (Flu A&B, Covid) Nasopharyngeal Swab     Status: None  ? Collection Time: 10/05/2021 12:59 PM  ? Specimen: Nasopharyngeal Swab; Nasopharyngeal(NP) swabs in vial transport medium  ?Result Value Ref Range Status  ? SARS Coronavirus 2 by RT PCR NEGATIVE NEGATIVE Final  ?  Comment: (NOTE) ?SARS-CoV-2 target nucleic acids are NOT DETECTED. ? ?The SARS-CoV-2 RNA is generally detectable in upper respiratory ?specimens during the acute phase of infection. The lowest ?concentration of SARS-CoV-2 viral copies this assay can detect is ?138 copies/mL. A negative result does not preclude  SARS-Cov-2 ?infection and should not be used as the sole basis for treatment or ?other patient management decisions. A negative result may occur with  ?improper specimen collection/handling, submission of specimen other ?t

## 2021-10-16 NOTE — Progress Notes (Signed)
Bladder scan reading 293 mL this would be 4th in and out catheterization. Foley catheter placed per protocol. 94 F placed using sterile technique witnessed by [employer]. 700 mL of red urine returned. MD notified.   ?

## 2021-10-17 DIAGNOSIS — N1832 Chronic kidney disease, stage 3b: Secondary | ICD-10-CM

## 2021-10-17 DIAGNOSIS — G9341 Metabolic encephalopathy: Secondary | ICD-10-CM

## 2021-10-17 DIAGNOSIS — N179 Acute kidney failure, unspecified: Secondary | ICD-10-CM

## 2021-10-17 DIAGNOSIS — J189 Pneumonia, unspecified organism: Secondary | ICD-10-CM | POA: Diagnosis not present

## 2021-10-17 DIAGNOSIS — I5033 Acute on chronic diastolic (congestive) heart failure: Secondary | ICD-10-CM

## 2021-10-17 DIAGNOSIS — J9601 Acute respiratory failure with hypoxia: Secondary | ICD-10-CM | POA: Diagnosis not present

## 2021-10-17 DIAGNOSIS — J9602 Acute respiratory failure with hypercapnia: Secondary | ICD-10-CM

## 2021-10-17 DIAGNOSIS — D61818 Other pancytopenia: Secondary | ICD-10-CM

## 2021-10-17 DIAGNOSIS — R410 Disorientation, unspecified: Secondary | ICD-10-CM

## 2021-10-17 LAB — CBC WITH DIFFERENTIAL/PLATELET
Abs Immature Granulocytes: 0.01 10*3/uL (ref 0.00–0.07)
Basophils Absolute: 0 10*3/uL (ref 0.0–0.1)
Basophils Relative: 1 %
Eosinophils Absolute: 0.2 10*3/uL (ref 0.0–0.5)
Eosinophils Relative: 5 %
HCT: 40.7 % (ref 39.0–52.0)
Hemoglobin: 12.6 g/dL — ABNORMAL LOW (ref 13.0–17.0)
Immature Granulocytes: 0 %
Lymphocytes Relative: 13 %
Lymphs Abs: 0.5 10*3/uL — ABNORMAL LOW (ref 0.7–4.0)
MCH: 30.2 pg (ref 26.0–34.0)
MCHC: 31 g/dL (ref 30.0–36.0)
MCV: 97.6 fL (ref 80.0–100.0)
Monocytes Absolute: 0.6 10*3/uL (ref 0.1–1.0)
Monocytes Relative: 16 %
Neutro Abs: 2.5 10*3/uL (ref 1.7–7.7)
Neutrophils Relative %: 65 %
Platelets: 142 10*3/uL — ABNORMAL LOW (ref 150–400)
RBC: 4.17 MIL/uL — ABNORMAL LOW (ref 4.22–5.81)
RDW: 13.1 % (ref 11.5–15.5)
WBC: 3.8 10*3/uL — ABNORMAL LOW (ref 4.0–10.5)
nRBC: 0 % (ref 0.0–0.2)

## 2021-10-17 LAB — GLUCOSE, CAPILLARY
Glucose-Capillary: 116 mg/dL — ABNORMAL HIGH (ref 70–99)
Glucose-Capillary: 119 mg/dL — ABNORMAL HIGH (ref 70–99)
Glucose-Capillary: 123 mg/dL — ABNORMAL HIGH (ref 70–99)
Glucose-Capillary: 72 mg/dL (ref 70–99)
Glucose-Capillary: 95 mg/dL (ref 70–99)
Glucose-Capillary: 96 mg/dL (ref 70–99)

## 2021-10-17 LAB — BASIC METABOLIC PANEL
Anion gap: 10 (ref 5–15)
BUN: 49 mg/dL — ABNORMAL HIGH (ref 8–23)
CO2: 34 mmol/L — ABNORMAL HIGH (ref 22–32)
Calcium: 8.7 mg/dL — ABNORMAL LOW (ref 8.9–10.3)
Chloride: 104 mmol/L (ref 98–111)
Creatinine, Ser: 1.35 mg/dL — ABNORMAL HIGH (ref 0.61–1.24)
GFR, Estimated: 51 mL/min — ABNORMAL LOW (ref >60)
Glucose, Bld: 71 mg/dL (ref 70–99)
Potassium: 3.3 mmol/L — ABNORMAL LOW (ref 3.5–5.1)
Sodium: 148 mmol/L — ABNORMAL HIGH (ref 135–145)

## 2021-10-17 MED ORDER — INSULIN ASPART 100 UNIT/ML IJ SOLN: 0.0000 [IU] | Freq: Three times a day (TID) | INTRAMUSCULAR | Status: DC

## 2021-10-17 MED ORDER — INSULIN ASPART 100 UNIT/ML IJ SOLN: 0.0000 [IU] | Freq: Every day | INTRAMUSCULAR | Status: DC

## 2021-10-17 NOTE — Progress Notes (Addendum)
? ?                                                                                                                                                     ?                                                   ?Daily Progress Note  ? ?Patient Name: Luke [name]       Date: 10/17/2021 ?DOB: 18-Dec-1932  Age: 88 y.o. MRN#: [id number] ?Attending Physician: Aline August, MD ?Primary Care Physician: Aretta Nip, MD ?Admit Date: 09/23/2021 ? ?Reason for Consultation/Follow-up: Establishing goals of care ? ?Subjective: ?I saw and examined Luke [name] today.  He was restless in bed and confused. ? ?His son and wife were at the bedside.  Son reports that he had discussed with Dr. Verlon Au and Dr. Sloan Leiter about his dad's confusion and hospital delirium.  They had talked about a plan to give him a couple more days to see if his mental status improved, but he also said understands possible implication that this is not going to get better. ? ?We reviewed his dad's prior functional status, nutrition, and his cognition.  We also talked about his acute declines this admission.  His son is open to conversation and reports understanding this may not be something from which he improves, however, he and his mother discussed giving him a couple of days to see if there is any improvement. ? ?Length of Stay: 8 ? ?Current Medications: ?Scheduled Meds:  ? aspirin EC  81 mg Oral Daily  ? Chlorhexidine Gluconate Cloth  6 each Topical Q0600  ? docusate sodium  100 mg Oral BID  ? enoxaparin (LOVENOX) injection  40 mg Subcutaneous Q24H  ? insulin detemir  6 Units Subcutaneous Daily  ? mouth rinse  15 mL Mouth Rinse BID  ? metoprolol succinate  25 mg Oral Daily  ? QUEtiapine  12.5 mg Oral BID  ? sodium chloride flush  10-40 mL Intracatheter Q12H  ? umeclidinium-vilanterol  1 puff Inhalation Daily  ? ? ?Continuous Infusions: ? ampicillin-sulbactam (UNASYN) IV Stopped (10/17/21 0617)  ? furosemide (LASIX) 200 mg in dextrose 5% 100 mL ('2mg'$ /mL)  infusion 4 mg/hr (10/17/21 0700)  ? ? ?PRN Meds: ?albuterol, food thickener, haloperidol lactate, LORazepam, metoprolol tartrate, nitroGLYCERIN, sodium chloride flush ? ?Physical Exam       ?General: Restless and agitated  ?Heart: Regular rate and rhythm. No murmur appreciated. ?Abdomen: Soft, nontender, nondistended, positive bowel sounds.   ?Ext: Mittens in place  ?skin: Warm and dry ? ? ?Vital Signs: BP (!) 179/86   Pulse 80   Temp 98.4 ?F (36.9 ?C) (Axillary)  Resp 15   Ht '5\' 11"'$  (1.803 m)   Wt 93 kg   SpO2 98%   BMI 28.60 kg/m?  ?SpO2: SpO2: 98 % ?O2 Device: O2 Device: Nasal Cannula ?O2 Flow Rate: O2 Flow Rate (L/min): 2 L/min ? ?Intake/output summary:  ?Intake/Output Summary (Last 24 hours) at 10/17/2021 0942 ?Last data filed at 10/17/2021 0700 ?Gross per 24 hour  ?Intake 308.14 ml  ?Output 2525 ml  ?Net -2216.86 ml  ? ?LBM: Last BM Date :  (3/23 per previous RN) ?Baseline Weight: Weight: 95.5 kg ?Most recent weight: Weight: 93 kg ? ?     ?Palliative Assessment/Data: ? ? ? ? ? ?Patient Active Problem List  ? Diagnosis Date Noted  ? Falls 11/23/2020  ? Syncope and collapse 11/23/2020  ? Acute clinical systolic heart failure ([place]) 10/06/2021  ? Heart failure ([place]) 09/30/2021  ? Recurrent cellulitis of lower leg 08/27/2021  ? Acute encephalopathy 04/10/2021  ? B12 deficiency 11/23/2020  ? Vitamin D deficiency 11/23/2020  ? Hypoglycemia 11/23/2020  ? COPD with acute exacerbation ([place]) 11/23/2020  ? COPD (chronic obstructive pulmonary disease) ([place]) 09/18/2017  ? Hypoxemia 09/18/2017  ? Primary hypothyroidism 09/19/2016  ? Stage 3a chronic kidney disease ([place]) 09/19/2016  ? Type 2 diabetes mellitus with hyperglycemia, with long-term current use of insulin ([place]) 09/19/2016  ? CRI (chronic renal insufficiency) 06/09/2014  ? Overweight 06/09/2014  ? Coronary atherosclerosis of native coronary artery 12/06/2013  ? Mixed hyperlipidemia 12/06/2013  ? Nonspecific abnormal results of cardiovascular function study  12/06/2013  ? Chronic diastolic heart failure ([place]) 12/06/2013  ? Essential hypertension, benign 12/06/2013  ? AKI (acute kidney injury) ([place]) 03/17/2013  ? N&V (nausea and vomiting) 03/17/2013  ? Diarrhea 03/17/2013  ? DM (diabetes mellitus) ([place]) 03/17/2013  ? Hypotension 03/17/2013  ? Dizziness 03/17/2013  ? ? ?Palliative Care Assessment & Plan  ? ?Patient Profile: ?88 y.o. male  with past medical history of mild cognitive impairment, CAD, CABG 1999, HFpEF, HTN, HLD, COPD, diabetes, CKD stage 3b admitted on 10/01/2021 from home where he lives with his wife with fall with loss of consciousness due to heart failure exacerbation, likely underlying OSA, underlying COPD. Unfortunately it seems that he had known fluid overload with prescription for Lasix but was noted to not be taking Lasix and also noted to have previously refused oxygen from pulmonary.  ? ?Recommendations/Plan: ?DNR/DNI ?Continue current care.  Family understands severity of his condition and we discussed hospital delirium.  Family has been in discussion with hospitalist regarding plan to continue with current care with focus on modifying any modifiable risk factors for delirium through the weekend but are realistic that this may be something for which he does not recover. ?Standard delirium management (adapted from NICE guidelines 2011 for prevention of delirium):  ?Provide continuity of care when possible (avoid frequent changing of surroundings and staff).  ?Frequent reorientation to time with:  ?A clock should always be visible.  ?Make sure Calendar/white board is updated.  ?Lights on/blinds open during the day and off/closed at night.  ?Encourage frequent family visits.  ?Monitor for and treat dehydration/constipation.  ?Optimize oxygen saturation.  ?Avoid catheters and IV's when possible and look for/treat infections.  ?Encourage early mobility.  ?Assess and treat pain.  ?Ensure adequate nutrition and functioning dentures.  ?Address reversible  causes of hearing and visual impairment:  ?Use pocket talker if hearing aids are unavailable.  ?Avoid sleep disturbance (normalize sleep/wake cycle).  ?Minimize disturbances and consider NOT obtaining vitals at night if  possible.  ?Review Medications to avoid polypharmacy and avoid deliriogenic medications when possible:  ?Benzodiazepines.  ?Dihydropyridines.  ?Antihistamines.  ?Anticholinergics.  ?(Possibly avoid: H2 blockers, tricyclic antidepressants, antiparkinson medications, steroids, NSAID's).   ? ? ?Code Status: ? ?  ?Code Status Orders  ?(From admission, onward)  ?  ? ? ?  ? ?  Start     Ordered  ? 10/10/2021 1814  Do not attempt resuscitation (DNR)  Continuous       ?Question Answer Comment  ?In the event of cardiac or respiratory ARREST Do not call a ?code blue?   ?In the event of cardiac or respiratory ARREST Do not perform Intubation, CPR, defibrillation or ACLS   ?In the event of cardiac or respiratory ARREST Use medication by any route, position, wound care, and other measures to relive pain and suffering. May use oxygen, suction and manual treatment of airway obstruction as needed for comfort.   ?  ? 09/25/2021 1815  ? ?  ?  ? ?  ? ?Code Status History   ? ? Date Active Date Inactive Code Status Order ID Comments User Context  ? 08/27/2021 1637 08/30/2021 1705 Full Code [id number]  Shawna Clamp, MD ED  ? 04/10/2021 1649 04/12/2021 1928 Full Code [id number]  Jonnie Finner, DO Inpatient  ? 11/23/2020 0659 11/24/2020 2038 Full Code [id number]  Para Skeans, MD ED  ? 03/17/2013 1630 03/20/2013 1738 Full Code [id number]  Geradine Girt, DO Inpatient  ? ?  ? ? ?Prognosis: ?Guarded ? ?Discharge Planning: ?To Be Determined ? ?Care plan was discussed with son, wife at bedside ? ?Thank you for allowing the Palliative Medicine Team to assist in the care of this patient. ? ? ?Total Time 30 Prolonged Time Billed No  ? ?   ?Greater than 50%  of this time was spent counseling and coordinating care related to the above assessment  and plan. ? ?Micheline Rough, MD ? ?Please contact Palliative Medicine Team phone at [phone] for questions and concerns.  ? ? ? ? ? ?

## 2021-10-17 NOTE — Progress Notes (Signed)
?PROGRESS NOTE ? ? ? ?Luke [name]  KWI:[id number] DOB: 10-Mar-1933 DOA: 10/17/2021 ?PCP: Aretta Nip, MD  ? ?Brief Narrative:  ?88 year old male with a history of CAD status post stenting/CABG, chronic diastolic CHF, diabetes mellitus type 2, CKD stage IIIb, hyperlipidemia, COPD, cognitive impairment with recent multiple hospitalizations presented with fall at home and confusion.  On presentation, his saturation was 88% on room air.  He was admitted for multifactorial respiratory failure and encephalopathy.  He has been treated with IV antibiotics and Lasix.  Cardiology was initially consulted who has signed off.  Lasix was initially discontinued because of AKI but after improvement in renal function, he has been currently on Lasix drip.  Palliative care was also consulted for goals of care discussion. ? ?Assessment & Plan: ?  ?Acute metabolic encephalopathy ?Agitation/delirium ?-CT head on presentation and repeat on 10/15/2021 showed no acute intracranial abnormality.  MRI of brain was not pursued.   ?-Patient has still required intermittent Ativan and Haldol.  Patient was started on scheduled Seroquel.  Might need soft restraints as needed ? ?Acute hypoxic and hypercapnic respiratory failure ?Possible aspiration pneumonia ?Acute on chronic diastolic congestive heart failure ?COPD ?Probable undiagnosed obstructive sleep apnea ?-BiPAP is being used at night.  Currently on 2 L oxygen via nasal cannula.  Chest x-ray from 10/13/2021 showed hazy ill-defined opacities in the lungs.  We will repeat chest x-ray today.  CTA chest on 10/10/2018 had not shown any PE. ?-Currently on Unasyn: Today is day #5. ?-Lasix was initially discontinued because of AKI but patient currently remains on Lasix drip from 10/15/2021.  Negative fluid balance of 5356.7 cc since admission.  Currently not on metolazone.  Continue metoprolol.  Acetazolamide has been discontinued. ? ?Acute kidney injury on stroke chronic kidney disease stage  IIIb ?-Creatinine peaked to 2.18 during this hospitalization.  Creatinine 1.35 today.  Diuretic plan as above.  Monitor creatinine ? ?Hypokalemia ?-Replace.  Repeat a.m. labs ? ?Hypernatremia ?-Possibly from poor oral intake and diuresis.  Monitor sodium. ? ?Diabetes mellitus type 2 with episodes of hyperglycemia and hypoglycemia ?-Blood sugars currently on the lower side.  Discontinue long-acting insulin. ? ?Pancytopenia (leukopenia/anemia of chronic disease/thrombocytopenia) ?-Patient is currently leukopenic/anemic and thrombocytopenic.  No signs of active bleeding. ? ?Goals of care ?-Patient remains intermittently agitated with delirium and confusion, currently on Lasix drip.  Overall prognosis is very poor.  I recommend comfort measures/hospice ?-Palliative care following ? ? ?DVT prophylaxis: Lovenox ?Code Status: DNR ?Family Communication: None at bedside ?Disposition Plan: ?Status is: Inpatient ?Remains inpatient appropriate because: Of severity of illness ? ?Consultants: Palliative care/cardiology, signed off ? ?Procedures: None ? ?Antimicrobials: Unasyn from 10/13/2021 onwards ? ? ?Subjective: ?Patient seen and examined at bedside.  Wakes up slightly, hardly answers any questions.  Nursing staff reports overnight agitation requiring Haldol/Ativan.  No overnight fever, vomiting reported. ? ?Objective: ?Vitals:  ? 10/17/21 0400 10/17/21 0500 10/17/21 0700 10/17/21 0810  ?BP: (!) 150/59  (!) 179/86   ?Pulse: 74  80   ?Resp: 13  15   ?Temp: 98.3 ?F (36.8 ?C)   98.4 ?F (36.9 ?C)  ?TempSrc: Axillary   Axillary  ?SpO2: 100%  98%   ?Weight:  93 kg    ?Height:      ? ? ?Intake/Output Summary (Last 24 hours) at 10/17/2021 1048 ?Last data filed at 10/17/2021 0700 ?Gross per 24 hour  ?Intake 308.14 ml  ?Output 2525 ml  ?Net -2216.86 ml  ? ?Filed Weights  ? 10/15/21 0500 10/16/21  3235 10/17/21 0500  ?Weight: 95.9 kg 95 kg 93 kg  ? ? ?Examination: ? ?General exam: Looks chronically ill and deconditioned.  Currently on 2 L  oxygen via nasal cannula.  No distress.   ?Respiratory system: Bilateral decreased breath sounds at bases with scattered crackles ?Cardiovascular system: S1 & S2 heard, Rate controlled ?Gastrointestinal system: Abdomen is distended slightly, soft and nontender. Normal bowel sounds heard. ?Extremities: No cyanosis, clubbing; bilateral lower extremity edema present ?Central nervous system: Drowsy, wakes up slightly, does not follow commands.  No focal neurological deficits. Moving extremities ?Skin: No obvious ecchymosis/lesions  ?psychiatry: Could not be assessed because of mental status ? ? ?Data Reviewed: I have personally reviewed following labs and imaging studies ? ?CBC: ?Recent Labs  ?Lab 10/13/21 ?0304 10/14/21 ?5732 10/15/21 ?0241 10/16/21 ?2025 10/17/21 ?0253  ?WBC 4.6 5.5 3.6* 3.1* 3.8*  ?NEUTROABS 2.7 3.9 2.3 1.9 2.5  ?HGB 12.5* 12.1* 12.1* 12.6* 12.6*  ?HCT 41.3 39.8 39.9 40.4 40.7  ?MCV 99.3 99.7 99.0 98.5 97.6  ?PLT 114* 128* 123* 127* 142*  ? ?Basic Metabolic Panel: ?Recent Labs  ?Lab 10/11/21 ?4270 10/12/21 ?6237 10/13/21 ?0304 10/14/21 ?6283 10/15/21 ?1517 10/16/21 ?6160 10/17/21 ?7371  ?NA 137   < > 137 138 141 144 148*  ?K 4.1   < > 4.0 3.7 3.7 3.5 3.3*  ?CL 94*   < > 94* 99 103 105 104  ?CO2 38*   < > 34* 32 33* 33* 34*  ?GLUCOSE 117*   < > 146* 144* 115* 119* 71  ?BUN 40*   < > 65* 61* 59* 57* 49*  ?CREATININE 1.71*   < > 2.18* 1.70* 1.41* 1.39* 1.35*  ?CALCIUM 8.3*   < > 8.1* 8.0* 8.5* 8.5* 8.7*  ?MG 2.2  --  2.7* 2.6* 2.8*  --   --   ?PHOS  --   --   --   --  4.2  --   --   ? < > = values in this interval not displayed.  ? ?GFR: ?Estimated Creatinine Clearance: 45.8 mL/min (A) (by C-G formula based on SCr of 1.35 mg/dL (H)). ?Liver Function Tests: ?Recent Labs  ?Lab 10/11/21 ?0258 10/13/21 ?0304 10/14/21 ?0626  ?AST '21 22 18  '$ ?ALT '12 15 15  '$ ?ALKPHOS 39 46 42  ?BILITOT 0.8 0.8 0.6  ?PROT 6.1* 6.2* 5.9*  ?ALBUMIN 3.7 3.6 3.6  ? ?No results for input(s): LIPASE, AMYLASE in the last 168 hours. ?No  results for input(s): AMMONIA in the last 168 hours. ?Coagulation Profile: ?No results for input(s): INR, PROTIME in the last 168 hours. ?Cardiac Enzymes: ?No results for input(s): CKTOTAL, CKMB, CKMBINDEX, TROPONINI in the last 168 hours. ?BNP (last 3 results) ?No results for input(s): PROBNP in the last 8760 hours. ?HbA1C: ?No results for input(s): HGBA1C in the last 72 hours. ?CBG: ?Recent Labs  ?Lab 10/16/21 ?1517 10/16/21 ?1950 10/16/21 ?2331 10/17/21 ?9485 10/17/21 ?0724  ?GLUCAP 85 77 83 72 96  ? ?Lipid Profile: ?No results for input(s): CHOL, HDL, LDLCALC, TRIG, CHOLHDL, LDLDIRECT in the last 72 hours. ?Thyroid Function Tests: ?No results for input(s): TSH, T4TOTAL, FREET4, T3FREE, THYROIDAB in the last 72 hours. ?Anemia Panel: ?No results for input(s): VITAMINB12, FOLATE, FERRITIN, TIBC, IRON, RETICCTPCT in the last 72 hours. ?Sepsis Labs: ?No results for input(s): PROCALCITON, LATICACIDVEN in the last 168 hours. ? ?Recent Results (from the past 240 hour(s))  ?Resp Panel by RT-PCR (Flu A&B, Covid) Nasopharyngeal Swab     Status: None  ?  Collection Time: 10/18/2021 12:59 PM  ? Specimen: Nasopharyngeal Swab; Nasopharyngeal(NP) swabs in vial transport medium  ?Result Value Ref Range Status  ? SARS Coronavirus 2 by RT PCR NEGATIVE NEGATIVE Final  ?  Comment: (NOTE) ?SARS-CoV-2 target nucleic acids are NOT DETECTED. ? ?The SARS-CoV-2 RNA is generally detectable in upper respiratory ?specimens during the acute phase of infection. The lowest ?concentration of SARS-CoV-2 viral copies this assay can detect is ?138 copies/mL. A negative result does not preclude SARS-Cov-2 ?infection and should not be used as the sole basis for treatment or ?other patient management decisions. A negative result may occur with  ?improper specimen collection/handling, submission of specimen other ?than nasopharyngeal swab, presence of viral mutation(s) within the ?areas targeted by this assay, and inadequate number of viral ?copies(<138  copies/mL). A negative result must be combined with ?clinical observations, patient history, and epidemiological ?information. The expected result is Negative. ? ?Fact Sheet for Patients:  ?https://www.fd

## 2021-10-17 NOTE — Evaluation (Signed)
Clinical/Bedside Swallow Evaluation ?Patient Details  ?Name: Luke [name] ?MRN: [id number] ?Date of Birth: Feb 19, 1933 ? ?Today's Date: 10/17/2021 ?Time: SLP Start Time (ACUTE ONLY): 0825 SLP Stop Time (ACUTE ONLY): 5956 ?SLP Time Calculation (min) (ACUTE ONLY): 25 min ? ?Past Medical History:  ?Past Medical History:  ?Diagnosis Date  ? Anginal pain ([place]) 02/08/12  ? Arthritis   ? "all over"  ? CKD (chronic kidney disease), stage III ([place])   ? COPD (chronic obstructive pulmonary disease) ([place])   ? Coronary artery disease   ? a. MI/CABG in 9/99 at [facility], [place] with (RIMA to LAD, LIMA to OM, SVG to PDA. b. LAD drug eluting stent in 3/09 through the graft to the distal LAD.  ? Exertional dyspnea   ? "doesn't take much these days"  ? H/O hiatal hernia   ? Hyperlipemia   ? Hypertension   ? Hypothyroidism   ? Hypoxia   ? Stroke [facility]) 09/2010  ? denies residual  ? Type II diabetes mellitus ([place])   ? ?Past Surgical History:  ?Past Surgical History:  ?Procedure Laterality Date  ? CATARACT EXTRACTION W/ INTRAOCULAR LENS  IMPLANT, BILATERAL  ~ 2007  ? COLONOSCOPY W/ POLYPECTOMY  09/2011  ? "removed 7"  ? CORONARY ANGIOPLASTY WITH STENT PLACEMENT  ~ 2004  ? "1"  ? CORONARY ARTERY BYPASS GRAFT  1999  ? CABG X4  ? EYE SURGERY    ? RETINAL DETACHMENT SURGERY  10/2011  ? right eye  ? ?HPI:  ?88 y.o. male presented to [facility] after sustaining a fall with LOC. Dx acute resp failure, CT head negative after fall, abnormal EKGPMHx CAD, COPD, stroke 2012, HH, DM2. Notes indicate patulous esophagus with some concern for aspiration.  Dx 2013 esophagram showed disruption of all primary esophageal peristaltic waves. Clinical/bedside swallow evaluation completed on 3/19 recommending regular solids, thin liquids and no f/u. On 3/22, speech swallow eval reordered as patient with possible right sided PNA.  ?  ?Assessment / Plan / Recommendation  ?Clinical Impression ? Patient presents with what appears to be a cognitive-based  dysphagia likely from requiring sedating meds but could also be exacerbation of reported mild cognitive disorder. Patient was able to cough up and expectorate some secretions but SLP also did remove some thick gobs of yellowish secretions from back of oral cavity. Patient was not able to suck through straw or sip through cup secondary to him not appropriately attentive to or responding to it. He did accept spoon sips of water and although he had immediate coughing, this appeared more related to secretion management as patient then did have some productive coughing. Patient then refused any further PO's. SLP spoke with RN and recommendation is for continuing on Dys 1, honey thick liquids diet but only feed if patient fully alert and accepting of PO's, focus more on oral care, attempting oral meds and allowing small amounts of plain (unthickened) water after oral care and not with any other PO's. SLP will follow patient for ability to advance with solids and liquids. ?SLP Visit Diagnosis: Dysphagia, unspecified (R13.10) ?   ?Aspiration Risk ? Risk for inadequate nutrition/hydration;Mild aspiration risk  ?  ?Diet Recommendation Other (Comment) (continue with Dys 1, honey thick, water in between meals if awake and alert)  ? ?Liquid Administration via: Cup;Spoon ?Medication Administration: Crushed with puree ?Supervision: Full supervision/cueing for compensatory strategies ?Compensations: Slow rate;Small sips/bites ?Postural Changes: Seated upright at 90 degrees  ?  ?Other  Recommendations Oral Care Recommendations: Oral care  BID;Staff/trained caregiver to provide oral care   ? ?Recommendations for follow up therapy are one component of a multi-disciplinary discharge planning process, led by the attending physician.  Recommendations may be updated based on patient status, additional functional criteria and insurance authorization. ? ?Follow up Recommendations Other (comment) (TBD)  ? ? ?  ?Assistance Recommended at  Discharge Frequent or constant Supervision/Assistance  ?Functional Status Assessment Patient has had a recent decline in their functional status and demonstrates the ability to make significant improvements in function in a reasonable and predictable amount of time.  ?Frequency and Duration min 2x/week  ?1 week ?  ?   ? ?Prognosis Prognosis for Safe Diet Advancement: Good ?Barriers to Reach Goals: Cognitive deficits;Behavior  ? ?  ? ?Swallow Study   ?General Date of Onset: 10/10/21 ?HPI: 88 y.o. male presented to [facility] after sustaining a fall with LOC. Dx acute resp failure, CT head negative after fall, abnormal EKGPMHx CAD, COPD, stroke 2012, HH, DM2. Notes indicate patulous esophagus with some concern for aspiration.  Dx 2013 esophagram showed disruption of all primary esophageal peristaltic waves. Clinical/bedside swallow evaluation completed on 3/19 recommending regular solids, thin liquids and no f/u. On 3/22, speech swallow eval reordered as patient with possible right sided PNA. ?Type of Study: Bedside Swallow Evaluation ?Previous Swallow Assessment: BSE on 3/19 no f/u recommended at that time ?Diet Prior to this Study: Dysphagia 1 (puree);Honey-thick liquids ?Temperature Spikes Noted: No ?Respiratory Status: Nasal cannula ?History of Recent Intubation: No ?Behavior/Cognition: Alert;Confused;Agitated;Requires cueing;Doesn't follow directions;Distractible ?Oral Cavity Assessment: Dry;Other (comment) (mild amount of thick gobs of phlegm in oral cavity) ?Oral Care Completed by SLP: Yes ?Oral Cavity - Dentition: Edentulous ?Self-Feeding Abilities: Total assist ?Patient Positioning: Upright in bed ?Baseline Vocal Quality: Normal ?Volitional Cough: Weak;Congested ?Volitional Swallow: Unable to elicit  ?  ?Oral/Motor/Sensory Function Overall Oral Motor/Sensory Function: Within functional limits   ?Ice Chips     ?Thin Liquid Thin Liquid: Impaired ?Presentation: Spoon;Cup;Straw ?Oral Phase Impairments: Poor awareness of  bolus;Reduced labial seal ?Pharyngeal  Phase Impairments: Cough - Immediate;Cough - Delayed ?Other Comments: Patient cognitively unable to suck through straw or drink through cup. He was able to take water via spoon. Coughing response appeared related to secretions/phlegm and although cough was weak, he was able to expectorate some mildly thick secretions  ?  ?Nectar Thick Nectar Thick Liquid: Not tested   ?Honey Thick Honey Thick Liquid: Not tested   ?Puree Puree: Not tested   ?Solid ? ? ?  Solid: Not tested  ? ?  ?Sonia Baller, MA, CCC-SLP ?Speech Therapy ? ? ? ? ?

## 2021-10-18 ENCOUNTER — Inpatient Hospital Stay (HOSPITAL_COMMUNITY): Payer: Medicare Other

## 2021-10-18 DIAGNOSIS — R0902 Hypoxemia: Secondary | ICD-10-CM | POA: Diagnosis not present

## 2021-10-18 DIAGNOSIS — N179 Acute kidney failure, unspecified: Secondary | ICD-10-CM | POA: Diagnosis not present

## 2021-10-18 DIAGNOSIS — I5033 Acute on chronic diastolic (congestive) heart failure: Secondary | ICD-10-CM | POA: Diagnosis not present

## 2021-10-18 DIAGNOSIS — G9341 Metabolic encephalopathy: Secondary | ICD-10-CM | POA: Diagnosis not present

## 2021-10-18 DIAGNOSIS — Z794 Long term (current) use of insulin: Secondary | ICD-10-CM

## 2021-10-18 DIAGNOSIS — E1165 Type 2 diabetes mellitus with hyperglycemia: Secondary | ICD-10-CM

## 2021-10-18 DIAGNOSIS — R410 Disorientation, unspecified: Secondary | ICD-10-CM

## 2021-10-18 DIAGNOSIS — J449 Chronic obstructive pulmonary disease, unspecified: Secondary | ICD-10-CM

## 2021-10-18 DIAGNOSIS — J9601 Acute respiratory failure with hypoxia: Secondary | ICD-10-CM | POA: Diagnosis not present

## 2021-10-18 DIAGNOSIS — J189 Pneumonia, unspecified organism: Secondary | ICD-10-CM | POA: Diagnosis not present

## 2021-10-18 LAB — CBC WITH DIFFERENTIAL/PLATELET
Abs Immature Granulocytes: 0.01 10*3/uL (ref 0.00–0.07)
Basophils Absolute: 0 10*3/uL (ref 0.0–0.1)
Basophils Relative: 1 %
Eosinophils Absolute: 0.2 10*3/uL (ref 0.0–0.5)
Eosinophils Relative: 5 %
HCT: 41.9 % (ref 39.0–52.0)
Hemoglobin: 13.2 g/dL (ref 13.0–17.0)
Immature Granulocytes: 0 %
Lymphocytes Relative: 17 %
Lymphs Abs: 0.7 10*3/uL (ref 0.7–4.0)
MCH: 30.2 pg (ref 26.0–34.0)
MCHC: 31.5 g/dL (ref 30.0–36.0)
MCV: 95.9 fL (ref 80.0–100.0)
Monocytes Absolute: 0.6 10*3/uL (ref 0.1–1.0)
Monocytes Relative: 14 %
Neutro Abs: 2.5 10*3/uL (ref 1.7–7.7)
Neutrophils Relative %: 63 %
Platelets: 156 10*3/uL (ref 150–400)
RBC: 4.37 MIL/uL (ref 4.22–5.81)
RDW: 12.8 % (ref 11.5–15.5)
WBC: 3.9 10*3/uL — ABNORMAL LOW (ref 4.0–10.5)
nRBC: 0 % (ref 0.0–0.2)

## 2021-10-18 LAB — BASIC METABOLIC PANEL
Anion gap: 8 (ref 5–15)
BUN: 45 mg/dL — ABNORMAL HIGH (ref 8–23)
CO2: 36 mmol/L — ABNORMAL HIGH (ref 22–32)
Calcium: 8.9 mg/dL (ref 8.9–10.3)
Chloride: 106 mmol/L (ref 98–111)
Creatinine, Ser: 1.34 mg/dL — ABNORMAL HIGH (ref 0.61–1.24)
GFR, Estimated: 52 mL/min — ABNORMAL LOW (ref >60)
Glucose, Bld: 85 mg/dL (ref 70–99)
Potassium: 3.2 mmol/L — ABNORMAL LOW (ref 3.5–5.1)
Sodium: 150 mmol/L — ABNORMAL HIGH (ref 135–145)

## 2021-10-18 LAB — GLUCOSE, CAPILLARY
Glucose-Capillary: 80 mg/dL (ref 70–99)
Glucose-Capillary: 84 mg/dL (ref 70–99)
Glucose-Capillary: 110 mg/dL — ABNORMAL HIGH (ref 70–99)

## 2021-10-18 LAB — MAGNESIUM: Magnesium: 2.4 mg/dL (ref 1.7–2.4)

## 2021-10-18 MED ORDER — POTASSIUM CHLORIDE 10 MEQ/100ML IV SOLN
10.0000 meq | INTRAVENOUS | Status: DC
  Administered 2021-10-18 (×4): 10 meq via INTRAVENOUS
  Filled 2021-10-18 (×4): qty 100

## 2021-10-18 MED ORDER — GLYCOPYRROLATE 0.2 MG/ML IJ SOLN
0.2000 mg | INTRAMUSCULAR | Status: DC | PRN
  Administered 2021-10-20: 0.2 mg via INTRAVENOUS

## 2021-10-18 MED ORDER — BISACODYL 10 MG RE SUPP
10.0000 mg | Freq: Once | RECTAL | Status: DC
  Filled 2021-10-18: qty 1

## 2021-10-18 MED ORDER — LORAZEPAM 2 MG/ML IJ SOLN
1.0000 mg | INTRAMUSCULAR | Status: DC | PRN
  Administered 2021-10-18 – 2021-10-19 (×3): 1 mg via INTRAVENOUS
  Filled 2021-10-18 (×3): qty 1

## 2021-10-18 MED ORDER — QUETIAPINE FUMARATE 25 MG PO TABS
25.0000 mg | ORAL_TABLET | Freq: Two times a day (BID) | ORAL | Status: DC
Start: 2021-10-18 — End: 2021-10-20
  Filled 2021-10-18: qty 1

## 2021-10-18 MED ORDER — SENNA 8.6 MG PO TABS
2.0000 | ORAL_TABLET | ORAL | Status: AC
  Administered 2021-10-18: 17.2 mg via ORAL
  Filled 2021-10-18: qty 2

## 2021-10-18 MED ORDER — GLYCOPYRROLATE 0.2 MG/ML IJ SOLN
0.2000 mg | INTRAMUSCULAR | Status: DC | PRN
  Filled 2021-10-18: qty 1

## 2021-10-18 MED ORDER — HALOPERIDOL LACTATE 5 MG/ML IJ SOLN
2.0000 mg | Freq: Once | INTRAMUSCULAR | Status: AC
  Administered 2021-10-18: 2 mg via INTRAVENOUS
  Filled 2021-10-18: qty 1

## 2021-10-18 MED ORDER — GLYCOPYRROLATE 1 MG PO TABS: 1.0000 mg | ORAL_TABLET | ORAL | Status: DC | PRN

## 2021-10-18 NOTE — Progress Notes (Signed)
?PROGRESS NOTE ? ? ? ?Luke [name]  EML:[id number] DOB: September 19, 1932 DOA: 10/08/2021 ?PCP: Aretta Nip, MD  ? ?Brief Narrative:  ?88 year old male with a history of CAD status post stenting/CABG, chronic diastolic CHF, diabetes mellitus type 2, CKD stage IIIb, hyperlipidemia, COPD, cognitive impairment with recent multiple hospitalizations presented with fall at home and confusion.  On presentation, his saturation was 88% on room air.  He was admitted for multifactorial respiratory failure and encephalopathy.  He has been treated with IV antibiotics and Lasix.  Cardiology was initially consulted who has signed off.  Lasix was initially discontinued because of AKI but after improvement in renal function, he has been currently on Lasix drip.  Palliative care was also consulted for goals of care discussion. ? ?Assessment & Plan: ?  ?Acute metabolic encephalopathy ?Agitation/delirium ?-CT head on presentation and repeat on 10/15/2021 showed no acute intracranial abnormality.  MRI of brain was not pursued.   ?-Patient has still required intermittent Ativan and Haldol, required last night as well.  Patient was started on scheduled Seroquel, will increase dose to 25 mg BID.  Might need soft restraints as needed ? ?Acute hypoxic and hypercapnic respiratory failure ?Possible aspiration pneumonia ?Acute on chronic diastolic congestive heart failure ?COPD ?Probable undiagnosed obstructive sleep apnea ?-Currently on 2 L oxygen via nasal cannula.  Chest x-ray from 10/13/2021 showed hazy ill-defined opacities in the lungs.  We will repeat chest x-ray today.  CTA chest on 10/15/2021 had not shown any PE. ?-Currently on Unasyn: Today is day #6. ?-Lasix was initially discontinued because of AKI but patient currently remains on Lasix drip from 10/15/2021.  Negative fluid balance of 7015.8 cc since admission.  Currently not on metolazone.  Continue metoprolol.  Acetazolamide has been discontinued.  Will DC Lasix drip today because  of worsening hypernatremia. ? ?Acute kidney injury on stroke chronic kidney disease stage IIIb ?-Creatinine peaked to 2.18 during this hospitalization.  Creatinine 1.34 today.  Diuretic plan as above.  Monitor creatinine ? ?Hypokalemia ?-Replace.  Repeat a.m. labs ? ?Hypernatremia ?-Possibly from poor oral intake and diuresis.  Monitor sodium. ? ?Diabetes mellitus type 2 with episodes of hyperglycemia and hypoglycemia ?-Blood sugars currently on the lower side.  Off long-acting insulin. ? ?Leukopenia ?-Thrombocytopenia has resolved ? ?Goals of care ?-Patient remains intermittently agitated with delirium and confusion, currently on Lasix drip.  Overall prognosis is very poor.  I recommend comfort measures/hospice ?-Palliative care following ? ? ?DVT prophylaxis: Lovenox ?Code Status: DNR ?Family Communication: None at bedside ?Disposition Plan: ?Status is: Inpatient ?Remains inpatient appropriate because: Of severity of illness ? ?Consultants: Palliative care/cardiology, signed off ? ?Procedures: None ? ?Antimicrobials: Unasyn from 10/13/2021 onwards ? ? ?Subjective: ?Patient seen and examined at bedside.  Extremely poor historian.  Required IV Haldol and Ativan last night.  No seizures, fever, vomiting reported. ?Objective: ?Vitals:  ? 10/18/21 0451 10/18/21 0500 10/18/21 0600 10/18/21 0700  ?BP:  (!) 144/41 122/70 136/87  ?Pulse: 66 66 (!) 103 73  ?Resp: 19 19 (!) 21 20  ?Temp:    98.3 ?F (36.8 ?C)  ?TempSrc:    Axillary  ?SpO2: 97% 98% 97% 96%  ?Weight:  92.9 kg    ?Height:      ? ? ?Intake/Output Summary (Last 24 hours) at 10/18/2021 0716 ?Last data filed at 10/18/2021 0400 ?Gross per 24 hour  ?Intake 340.97 ml  ?Output 2000 ml  ?Net -1659.03 ml  ? ? ?Filed Weights  ? 10/16/21 0353 10/17/21 0500 10/18/21 0500  ?  Weight: 95 kg 93 kg 92.9 kg  ? ? ?Examination: ? ?General: No acute distress, on 2 L oxygen by nasal cannula.   ?ENT/neck: No elevated JVD.  No obvious masses  ?respiratory: Decreased breath sounds at bases  with bibasilar crackles, no wheezing  ?CVS: Currently rate controlled; S1-S2 heard  ?abdominal: Soft, nontender, mildly distended, no organomegaly, bowel sounds are heard ?Extremities: Mild lower extremity edema present; no cyanosis  ?CNS: Drowsy.  Wakes up only very slightly.  No focal neurologic deficit.  Moves extremities  ?lymph: No cervical lymphadenopathy ?Skin: No obvious ecchymosis/lesions ?Psych: Cannot assess because of drowsiness  ?musculoskeletal: No obvious joint deformity/tenderness/swelling ? ? ? ?Data Reviewed: I have personally reviewed following labs and imaging studies ? ?CBC: ?Recent Labs  ?Lab 10/14/21 ?0244 10/15/21 ?0241 10/16/21 ?0259 10/17/21 ?1791 10/18/21 ?0256  ?WBC 5.5 3.6* 3.1* 3.8* 3.9*  ?NEUTROABS 3.9 2.3 1.9 2.5 2.5  ?HGB 12.1* 12.1* 12.6* 12.6* 13.2  ?HCT 39.8 39.9 40.4 40.7 41.9  ?MCV 99.7 99.0 98.5 97.6 95.9  ?PLT 128* 123* 127* 142* 156  ? ? ?Basic Metabolic Panel: ?Recent Labs  ?Lab 10/13/21 ?0304 10/14/21 ?5056 10/15/21 ?9794 10/16/21 ?8016 10/17/21 ?5537 10/18/21 ?0256  ?NA 137 138 141 144 148* 150*  ?K 4.0 3.7 3.7 3.5 3.3* 3.2*  ?CL 94* 99 103 105 104 106  ?CO2 34* 32 33* 33* 34* 36*  ?GLUCOSE 146* 144* 115* 119* 71 85  ?BUN 65* 61* 59* 57* 49* 45*  ?CREATININE 2.18* 1.70* 1.41* 1.39* 1.35* 1.34*  ?CALCIUM 8.1* 8.0* 8.5* 8.5* 8.7* 8.9  ?MG 2.7* 2.6* 2.8*  --   --  2.4  ?PHOS  --   --  4.2  --   --   --   ? ? ?GFR: ?Estimated Creatinine Clearance: 46.1 mL/min (A) (by C-G formula based on SCr of 1.34 mg/dL (H)). ?Liver Function Tests: ?Recent Labs  ?Lab 10/13/21 ?0304 10/14/21 ?4827  ?AST 22 18  ?ALT 15 15  ?ALKPHOS 46 42  ?BILITOT 0.8 0.6  ?PROT 6.2* 5.9*  ?ALBUMIN 3.6 3.6  ? ? ?No results for input(s): LIPASE, AMYLASE in the last 168 hours. ?No results for input(s): AMMONIA in the last 168 hours. ?Coagulation Profile: ?No results for input(s): INR, PROTIME in the last 168 hours. ?Cardiac Enzymes: ?No results for input(s): CKTOTAL, CKMB, CKMBINDEX, TROPONINI in the last 168  hours. ?BNP (last 3 results) ?No results for input(s): PROBNP in the last 8760 hours. ?HbA1C: ?No results for input(s): HGBA1C in the last 72 hours. ?CBG: ?Recent Labs  ?Lab 10/17/21 ?1220 10/17/21 ?1527 10/17/21 ?2005 10/17/21 ?2342 10/18/21 ?0324  ?GLUCAP 123* 116* 119* 95 80  ? ? ?Lipid Profile: ?No results for input(s): CHOL, HDL, LDLCALC, TRIG, CHOLHDL, LDLDIRECT in the last 72 hours. ?Thyroid Function Tests: ?No results for input(s): TSH, T4TOTAL, FREET4, T3FREE, THYROIDAB in the last 72 hours. ?Anemia Panel: ?No results for input(s): VITAMINB12, FOLATE, FERRITIN, TIBC, IRON, RETICCTPCT in the last 72 hours. ?Sepsis Labs: ?No results for input(s): PROCALCITON, LATICACIDVEN in the last 168 hours. ? ?Recent Results (from the past 240 hour(s))  ?Resp Panel by RT-PCR (Flu A&B, Covid) Nasopharyngeal Swab     Status: None  ? Collection Time: 10/19/2021 12:59 PM  ? Specimen: Nasopharyngeal Swab; Nasopharyngeal(NP) swabs in vial transport medium  ?Result Value Ref Range Status  ? SARS Coronavirus 2 by RT PCR NEGATIVE NEGATIVE Final  ?  Comment: (NOTE) ?SARS-CoV-2 target nucleic acids are NOT DETECTED. ? ?The SARS-CoV-2 RNA is generally detectable in  upper respiratory ?specimens during the acute phase of infection. The lowest ?concentration of SARS-CoV-2 viral copies this assay can detect is ?138 copies/mL. A negative result does not preclude SARS-Cov-2 ?infection and should not be used as the sole basis for treatment or ?other patient management decisions. A negative result may occur with  ?improper specimen collection/handling, submission of specimen other ?than nasopharyngeal swab, presence of viral mutation(s) within the ?areas targeted by this assay, and inadequate number of viral ?copies(<138 copies/mL). A negative result must be combined with ?clinical observations, patient history, and epidemiological ?information. The expected result is Negative. ? ?Fact Sheet for Patients:   ?EntrepreneurPulse.com.au ? ?Fact Sheet for Healthcare Providers:  ?IncredibleEmployment.be ? ?This test is no t yet approved or cleared by the [place] and  ?has been authorized for detection an

## 2021-10-18 NOTE — Progress Notes (Addendum)
Physical Therapy Treatment ?Patient Details ?Name: Luke [name] ?MRN: [id number] ?DOB: 27-Aug-1932 ?Today's Date: 10/18/2021 ? ? ?History of Present Illness This 88 years old male with PMH significant for COPD, CAD, hypertension, hyperlipidemia, CKD stage IIIb, hypothyroidism, diabetes mellitus type 2 presented in the ED with complaints of fall at home with head wound and dx with acute decompensated HFpEF and acute repiratory failure 2* heart failure ? ?  ?PT Comments  ? ? Pt requiring significantly more assist this sessions, +2 total assist for bed mobility. Pt attempting to climb OOB on PT arrival however once in sitting pt seemed to be completely asleep/lethargic after ~ 3 minutes and unable to self assist, increasing posterior bias with incr time in sitting.   ? ?VS: ?SPO2=93-96% on 4L [place] ?HR 80s ?RR19-22 ?  ?Recommendations for follow up therapy are one component of a multi-disciplinary discharge planning process, led by the attending physician.  Recommendations may be updated based on patient status, additional functional criteria and insurance authorization. ? ?Follow Up Recommendations ? Other (comment) (possible Hospice, if improves--SNF) ?  ?  ?Assistance Recommended at Discharge Frequent or constant Supervision/Assistance  ?Patient can return home with the following Two people to help with walking and/or transfers;Two people to help with bathing/dressing/bathroom;Assistance with cooking/housework;Assistance with feeding;Direct supervision/assist for medications management;Direct supervision/assist for financial management;Assist for transportation;Help with stairs or ramp for entrance ?  ?Equipment Recommendations ? None recommended by PT  ?  ?Recommendations for Other Services   ? ? ?  ?Precautions / Restrictions Precautions ?Precautions: Fall ?Precaution Comments: monitor vitals ?Restrictions ?Weight Bearing Restrictions: No  ?  ? ?Mobility ? Bed Mobility ?Overal bed mobility: Needs Assistance ?Bed  Mobility: Supine to Sit, Sit to Supine ?  ?  ?Supine to sit: Max assist, Total assist, +2 for physical assistance, +2 for safety/equipment ?Sit to supine: Total assist, Max assist, +2 for physical assistance, +2 for safety/equipment ?  ?General bed mobility comments: assist to elevate trunk, pt is able to self assist LEs off bed, total assist to lower trunk, lift LEs on to bed and to scoot in supine ?  ? ?Transfers ?  ?  ?  ?  ?  ?  ?  ?  ?  ?General transfer comment: unable ?  ? ?Ambulation/Gait ?  ?  ?  ?  ?  ?  ?  ?  ? ? ?Stairs ?  ?  ?  ?  ?  ? ? ?Wheelchair Mobility ?  ? ?Modified Rankin (Stroke Patients Only) ?  ? ? ?  ?Balance Overall balance assessment: Needs assistance ?Sitting-balance support: Feet supported, No upper extremity supported ?Sitting balance-Leahy Scale: Poor ?Sitting balance - Comments: increasing posterior lean with incr time in sitting ?Postural control: Posterior lean ?  ?  ?  ?  ?  ?  ?  ?  ?  ?  ?  ?  ?  ?  ?  ? ?  ?Cognition Arousal/Alertness: Lethargic, Suspect due to medications ?Behavior During Therapy: Restless ?Overall Cognitive Status: History of cognitive impairments - at baseline ?  ?  ?  ?  ?  ?  ?  ?  ?  ?  ?  ?  ?  ?  ?  ?  ?General Comments: pt follows commands inconsistently, attempts to speak however speech is garbled. pt is attempting to climb out of bed on PT arrival however appears fully asleep once sitting on EOB for ~ 3 minutes, despite multi-modal stimuli ?  ?  ? ?  ?  Exercises   ? ?  ?General Comments   ?  ?  ? ?Pertinent Vitals/Pain Pain Assessment ?Pain Assessment: Faces ?Faces Pain Scale: No hurt  ? ? ?Home Living   ?  ?  ?  ?  ?  ?  ?  ?  ?  ?   ?  ?Prior Function    ?  ?  ?   ? ?PT Goals (current goals can now be found in the care plan section) Acute Rehab PT Goals ?PT Goal Formulation: With patient ?Time For Goal Achievement: 10/24/21 ?Potential to Achieve Goals: Fair ?Progress towards PT goals: Not progressing toward goals - comment (lethargic) ? ?   ?Frequency ? ? ? Min 2X/week ? ? ? ?  ?PT Plan Other (comment);Current plan remains appropriate  ? ? ?Co-evaluation   ?  ?  ?  ?  ? ?  ?AM-PAC PT "6 Clicks" Mobility   ?Outcome Measure ? Help needed turning from your back to your side while in a flat bed without using bedrails?: Total ?Help needed moving from lying on your back to sitting on the side of a flat bed without using bedrails?: Total ?Help needed moving to and from a bed to a chair (including a wheelchair)?: Total ?Help needed standing up from a chair using your arms (e.g., wheelchair or bedside chair)?: Total ?Help needed to walk in hospital room?: Total ?Help needed climbing 3-5 steps with a railing? : Total ?6 Click Score: 6 ? ?  ?End of Session   ?Activity Tolerance: Patient limited by lethargy ?Patient left: in bed;with call bell/phone within reach;with bed alarm set;Other (comment) (in sight of RN) ?Nurse Communication: Mobility status ?PT Visit Diagnosis: Unsteadiness on feet (R26.81);Muscle weakness (generalized) (M62.81);Difficulty in walking, not elsewhere classified (R26.2);History of falling (Z91.81) ?  ? ? ?Time: 2841-3244 ?PT Time Calculation (min) (ACUTE ONLY): 12 min ? ?Charges:  $Therapeutic Activity: 8-22 mins          ?          ? Baxter Flattery, PT ? ?Acute Rehab Dept [facility]) [phone] ?Pager [phone] ? ?10/18/2021 ? ? ? ?Luke [name] ?10/18/2021, 2:31 PM ? ?

## 2021-10-18 NOTE — Progress Notes (Signed)
Very confused and agitated this shift. PRN meds not effective at reducing agitation. Provider on call aware. Pulling on foley, yelling, removing monitor equipment, and attempting bed exit ?

## 2021-10-18 NOTE — Progress Notes (Signed)
Patient continues to be aggitated when RT tries to put his BIPAP on. RN aware.  ?

## 2021-10-18 NOTE — Progress Notes (Signed)
Speech Language Pathology Treatment: Dysphagia  ?Patient Details ?Name: Luke [name] ?MRN: [id number] ?DOB: [birth year] ?Today's Date: 10/18/2021 ?Time: 0940-1005 ?SLP Time Calculation (min) (ACUTE ONLY): 25 min ? ?Assessment / Plan / Recommendation ?Clinical Impression ? Patient seen by SLP for skilled treatment session focused on dysphagia goals. RN in room reporting that patient has been more calm this morning and has not had his AM dose of Haldol yet. Patient and RN repositioned him in bed and were able to achieve a more upright seated position but patient quickly starts sliding down in bed and leaning to right. SLP performed oral care via toothette sponge and yaunkers suctionl. Patient cooperated and was more responsive to this as compared to previous session. He continues with weak cough however he was able to cough up some secretions to back of oral cavity and SLP suctioned up (thick, light yellow) and he did this twice during session. Patient accepted toothette sponges soaked in water and he sucked the water and did exhibit a swallow response. SLP then held ice chip to his lips and he licked the water, followed by SLP allowing patient to take whole ice chip in mouth. He exhibited some mild attempts to masticate. No overt s/s aspiration or penetration observed. SLP will continue to follow for PO toleration and ability to trial upgraded solids and liquids. ? ?  ?HPI HPI: 88 y.o. male presented to [facility] after sustaining a fall with LOC. Dx acute resp failure, CT head negative after fall, abnormal EKGPMHx CAD, COPD, stroke 2012, HH, DM2. Notes indicate patulous esophagus with some concern for aspiration.  Dx 2013 esophagram showed disruption of all primary esophageal peristaltic waves. Clinical/bedside swallow evaluation completed on 3/19 recommending regular solids, thin liquids and no f/u. On 3/22, speech swallow eval reordered as patient with possible right sided PNA. ?  ?   ?SLP Plan ? Continue with current  plan of care ? ?  ?  ?Recommendations for follow up therapy are one component of a multi-disciplinary discharge planning process, led by the attending physician.  Recommendations may be updated based on patient status, additional functional criteria and insurance authorization. ?  ? ?Recommendations  ?Diet recommendations: Dysphagia 1 (puree);Honey-thick liquid ?Liquids provided via: Cup ?Medication Administration: Crushed with puree ?Supervision: Full supervision/cueing for compensatory strategies;Trained caregiver to feed patient;Staff to assist with self feeding ?Compensations: Slow rate;Small sips/bites ?Postural Changes and/or Swallow Maneuvers: Seated upright 90 degrees  ?   ?    ?   ? ? ? ? Oral Care Recommendations: Oral care BID;Staff/trained caregiver to provide oral care ?Follow Up Recommendations: Skilled nursing-short term rehab (<3 hours/day) ?Assistance recommended at discharge: Frequent or constant Supervision/Assistance ?SLP Visit Diagnosis: Dysphagia, unspecified (R13.10) ?Plan: Continue with current plan of care ? ? ? ? ?  ?  ? ?Sonia Baller, MA, CCC-SLP ?Speech Therapy ? ?

## 2021-10-18 NOTE — Progress Notes (Signed)
Pharmacy Antibiotic Note ? ?SINGLETON [name] is a 88 y.o. male admitted on 10/08/2021.  Pharmacy has been consulted for Unasyn dosing for aspiration pneumonia. ? ?Plan: ?Continue Unasyn 3g IV q8h ?Discussed stop date with Dr. Starla Link - stop on 3/28 after 7 days of antibiotic therapy.   ? ?Height: '5\' 11"'$  (180.3 cm) ?Weight: 92.9 kg (204 lb 12.9 oz) ?IBW/kg (Calculated) : 75.3 ? ?Temp (24hrs), Avg:98.4 ?F (36.9 ?C), Min:98.1 ?F (36.7 ?C), Max:98.6 ?F (37 ?C) ? ?Recent Labs  ?Lab 10/14/21 ?0244 10/15/21 ?0241 10/16/21 ?0259 10/17/21 ?3354 10/18/21 ?0256  ?WBC 5.5 3.6* 3.1* 3.8* 3.9*  ?CREATININE 1.70* 1.41* 1.39* 1.35* 1.34*  ?  ?Estimated Creatinine Clearance: 46.1 mL/min (A) (by C-G formula based on SCr of 1.34 mg/dL (H)).   ? ?No Known Allergies ? ?Antimicrobials this admission: ?3/22 Unasyn >>  (3/28) ? ?Microbiology results: ?3/18 Covid neg; influenza neg ?3/18 MRSA PCR: detected ? ?Thank you for allowing pharmacy to be a part of this patientLukes care. ? ? ?Gretta Arab PharmD, BCPS ?Clinical Pharmacist ?[facility] [phone] ?10/18/2021 11:00 AM ? ? ?

## 2021-10-18 NOTE — Progress Notes (Signed)
? ?                                                                                                                                                     ?                                                   ?Daily Progress Note  ? ?Patient Name: Luke [name]       Date: 10/18/2021 ?DOB: Feb 04, 1933  Age: 88 y.o. MRN#: [id number] ?Attending Physician: Aline August, MD ?Primary Care Physician: Aretta Nip, MD ?Admit Date: 09/26/2021 ? ?Reason for Consultation/Follow-up: Establishing goals of care ? ?Patient Profile/HPI:  88 y.o. male  with past medical history of mild cognitive impairment, CAD, CABG 1999, HFpEF, HTN, HLD, COPD, diabetes, CKD stage 3b admitted on 09/30/2021 from home where he lives with his wife with fall with loss of consciousness due to heart failure exacerbation, likely underlying OSA, underlying COPD. Unfortunately it seems that he had known fluid overload with prescription for Lasix but was noted to not be taking Lasix and also noted to have previously refused oxygen from pulmonary.  ? ?Subjective: ?Chart reviewed including epic progress notes labs and imaging. ?Evaluated patient this morning he was quite agitated. ?Lasix has been discontinued due to hyponatremia.  He is not eating or drinking. ?Unfortunately he is not showing signs of improvement. ?I met later this afternoon with his family.  After discussion with patient's attending Dr. And myself family has made decision to transition patient to full comfort measures only.  We discussed that full comfort measures only entails stopping IV fluids, IV antibiotics, lab draws and focusing on symptom management including medications for pain, agitation, shortness of breath, and supporting patient through natural end-of-life process. ?We discussed possible inpatient hospice and family is in agreement and would like patient evaluated for hospice bed at [facility]. ? ?Review of Systems  ?Unable to perform ROS: Mental acuity  ? ? ?Physical  Exam ?Vitals and nursing note reviewed.  ?Constitutional:   ?   Appearance: He is ill-appearing.  ?Pulmonary:  ?   Breath sounds: Wheezing present.  ?Neurological:  ?   Comments: Agitated, does not follow commands  ?         ? ?Vital Signs: BP 136/87 (BP Location: Right Wrist)   Pulse 73   Temp (!) 97.3 ?F (36.3 ?C) (Axillary)   Resp 20   Ht $R'5\' 11"'Uc$  (1.803 m)   Wt 92.9 kg   SpO2 96%   BMI 28.56 kg/m?  ?SpO2: SpO2: 96 % ?O2 Device: O2 Device: Nasal Cannula ?O2 Flow Rate: O2 Flow Rate (L/min): 2 L/min ? ?Intake/output summary:  ?Intake/Output  Summary (Last 24 hours) at 10/18/2021 1510 ?Last data filed at 10/18/2021 1353 ?Gross per 24 hour  ?Intake 806.91 ml  ?Output 2650 ml  ?Net -1843.09 ml  ? ?LBM: Last BM Date :  (3/23 per previous RN) ?Baseline Weight: Weight: 95.5 kg ?Most recent weight: Weight: 92.9 kg ? ?     ?Palliative Assessment/Data: PPS: 10% ? ? ? ? ? ?Patient Active Problem List  ? Diagnosis Date Noted  ? Acute respiratory failure with hypoxia and hypercapnia ([place]) 10/17/2021  ? Multifocal pneumonia 10/17/2021  ? Acute metabolic encephalopathy 65/09/5463  ? Delirium 10/17/2021  ? Pancytopenia ([place]) 10/17/2021  ? Recurrent cellulitis of lower leg 08/27/2021  ? Acute encephalopathy 04/10/2021  ? B12 deficiency 11/23/2020  ? Vitamin D deficiency 11/23/2020  ? Falls 11/23/2020  ? Syncope and collapse 11/23/2020  ? Hypoglycemia 11/23/2020  ? COPD with acute exacerbation ([place]) 11/23/2020  ? COPD (chronic obstructive pulmonary disease) ([place]) 09/18/2017  ? Hypoxemia 09/18/2017  ? Primary hypothyroidism 09/19/2016  ? CKD (chronic kidney disease), stage III ([place]) 09/19/2016  ? Type 2 diabetes mellitus with hyperglycemia, with long-term current use of insulin ([place]) 09/19/2016  ? CRI (chronic renal insufficiency) 06/09/2014  ? Overweight 06/09/2014  ? Coronary atherosclerosis of native coronary artery 12/06/2013  ? Mixed hyperlipidemia 12/06/2013  ? Nonspecific abnormal results of cardiovascular function study  12/06/2013  ? Acute on chronic diastolic CHF (congestive heart failure) ([place]) 12/06/2013  ? Essential hypertension, benign 12/06/2013  ? AKI (acute kidney injury) ([place]) 03/17/2013  ? N&V (nausea and vomiting) 03/17/2013  ? Diarrhea 03/17/2013  ? DM (diabetes mellitus) ([place]) 03/17/2013  ? Hypotension 03/17/2013  ? Dizziness 03/17/2013  ? ? ?Palliative Care Assessment & Plan  ? ? ?Assessment/Recommendations/Plan ? ?Transition to full comfort measures only ?Transition to full comfort measures ?Hydromorphone .5mg  IV q15 min prn for SOB, discomfort, agitation ?Lorazepam 1mg   IV q4 hr prn anxiety ?Haldol .5mg   IV q4 hr PRN agitation ?Glycopyrrolate .2mg  IV q4hr prn secretions ?TOC referral for inpatient hospice house placement ? ? ?Code Status: ?DNR ? ?Prognosis: ? < 2 weeks ? ?Discharge Planning: ?Hospice facility ? ?Care plan was discussed with family and care team. ? ?Thank you for allowing the Palliative Medicine Team to assist in the care of this patient. ? ?Total time: 80 minutes  ? ?Mariana Kaufman, AGNP-C ?Palliative Medicine ? ? ?Please contact Palliative Medicine Team phone at [phone] for questions and concerns.  ? ? ? ? ? ? ?

## 2021-10-19 DIAGNOSIS — Z515 Encounter for palliative care: Secondary | ICD-10-CM | POA: Diagnosis not present

## 2021-10-19 DIAGNOSIS — N179 Acute kidney failure, unspecified: Secondary | ICD-10-CM | POA: Diagnosis not present

## 2021-10-19 DIAGNOSIS — J9601 Acute respiratory failure with hypoxia: Secondary | ICD-10-CM | POA: Diagnosis not present

## 2021-10-19 DIAGNOSIS — R410 Disorientation, unspecified: Secondary | ICD-10-CM | POA: Diagnosis not present

## 2021-10-19 DIAGNOSIS — G9341 Metabolic encephalopathy: Secondary | ICD-10-CM | POA: Diagnosis not present

## 2021-10-19 DIAGNOSIS — I5033 Acute on chronic diastolic (congestive) heart failure: Secondary | ICD-10-CM | POA: Diagnosis not present

## 2021-10-19 MED ORDER — LORAZEPAM 2 MG/ML IJ SOLN: 2.0000 mg | INTRAMUSCULAR | Status: DC | PRN

## 2021-10-19 MED ORDER — HYDROMORPHONE HCL 1 MG/ML IJ SOLN
0.5000 mg | INTRAMUSCULAR | Status: DC | PRN
  Administered 2021-10-20: 0.5 mg via INTRAVENOUS
  Filled 2021-10-19: qty 0.5

## 2021-10-19 MED ORDER — LORAZEPAM 2 MG/ML IJ SOLN
2.0000 mg | INTRAMUSCULAR | Status: DC
  Administered 2021-10-19 – 2021-10-20 (×6): 2 mg via INTRAVENOUS
  Filled 2021-10-19 (×6): qty 1

## 2021-10-19 NOTE — Progress Notes (Addendum)
Manufacturing engineer [facility]) Hospital Liaison Note ? ?Received request from [facility] for family interest in [facility]. Chart under review for hospice eligibility by [facility] physician at this time. Spoke to patient son to confirm interest and explain services. Family agreeable to transfer to [facility] when medically stable and room available.  ? ?Unfortunately, [facility] is not able to offer a room today. Family and TOC are aware that a [facility] Hosptial Liaison will follow with TOC and family tomorrow or sooner if room becomes available.\\ ? ?Please do not hesitate to call with any hospice related questions, ? ?Gar Ponto, RN ?[facility] HLT ?[phone] ? ?UPDATE at 1342 - Patient is eligible for residential hospice per Dr. Tomasa Hosteller of [facility].  ?

## 2021-10-19 NOTE — Progress Notes (Signed)
? ?                                                                                                                                                 ?                                                   ?Daily Progress Note  ? ?Patient Name: Luke [name]       Date: 10/19/2021 ?DOB: 1933-02-27  Age: 88 y.o. MRN#: [id number] ?Attending Physician: Aline August, MD ?Primary Care Physician: Aretta Nip, MD ?Admit Date: 10/04/2021 ? ?Reason for Consultation/Follow-up: Establishing goals of care ? ?Patient Profile/HPI:  88 y.o. male  with past medical history of mild cognitive impairment, CAD, CABG 1999, HFpEF, HTN, HLD, COPD, diabetes, CKD stage 3b admitted on 10/22/2021 from home where he lives with his wife with fall with loss of consciousness due to heart failure exacerbation, likely underlying OSA, underlying COPD. Unfortunately it seems that he had known fluid overload with prescription for Lasix but was noted to not be taking Lasix and also noted to have previously refused oxygen from pulmonary.  ? ?Subjective: ?Patient sleeping in bed today.  ?Reports of increased agitation by RN overnight.  ?Has not needed PRN ativan since early this morning, although easily becomes agitated with physical exam. Otherwise appears comfortable. ?Plan to transfer to general floor pending bed ?Plan for inpatient hospice, currently awaiting bed availability.  ? ?Review of Systems  ?Unable to perform ROS: Mental acuity  ? ? ?Physical Exam ?Vitals and nursing note reviewed.  ?Constitutional:   ?   Appearance: He is ill-appearing.  ?Pulmonary:  ?   Breath sounds: Rales present.  ?   Comments: Increased effort ?Skin: ?   General: Skin is warm and dry.  ?   Coloration: Skin is not cyanotic.  ?   Comments: No mottling  ?Psychiatric:     ?   Behavior: Behavior is agitated.  ?         ?Vital Signs: BP (!) 146/65 (BP Location: Right Arm)   Pulse 89   Temp (!) 97.2 ?F (36.2 ?C) (Axillary)   Resp (!) 26   Ht '5\' 11"'$  (1.803 m)   Wt 92.9  kg   SpO2 97%   BMI 28.56 kg/m?  ?SpO2: SpO2: 97 % ?O2 Device: O2 Device: Nasal Cannula ?O2 Flow Rate: O2 Flow Rate (L/min): 2 L/min ? ?Intake/output summary:  ?Intake/Output Summary (Last 24 hours) at 10/19/2021 1348 ?Last data filed at 10/19/2021 0600 ?Gross per 24 hour  ?Intake 351.67 ml  ?Output 450 ml  ?Net -98.33 ml  ? ? ?LBM: Last BM Date :  (3/23 per previous RN) ?Baseline Weight:  Weight: 95.5 kg ?Most recent weight: Weight: 92.9 kg ? ?     ?Palliative Assessment/Data: PPS: 10% ? ? ? ? ?Patient Active Problem List  ? Diagnosis Date Noted  ? Comfort measures only status 10/19/2021  ? Acute respiratory failure with hypoxia and hypercapnia ([place]) 10/17/2021  ? Multifocal pneumonia 10/17/2021  ? Acute metabolic encephalopathy 35/00/9381  ? Delirium 10/17/2021  ? Pancytopenia ([place]) 10/17/2021  ? Recurrent cellulitis of lower leg 08/27/2021  ? Acute encephalopathy 04/10/2021  ? B12 deficiency 11/23/2020  ? Vitamin D deficiency 11/23/2020  ? Falls 11/23/2020  ? Syncope and collapse 11/23/2020  ? Hypoglycemia 11/23/2020  ? COPD with acute exacerbation ([place]) 11/23/2020  ? COPD (chronic obstructive pulmonary disease) ([place]) 09/18/2017  ? Hypoxemia 09/18/2017  ? Primary hypothyroidism 09/19/2016  ? CKD (chronic kidney disease), stage III ([place]) 09/19/2016  ? Type 2 diabetes mellitus with hyperglycemia, with long-term current use of insulin ([place]) 09/19/2016  ? CRI (chronic renal insufficiency) 06/09/2014  ? Overweight 06/09/2014  ? Coronary atherosclerosis of native coronary artery 12/06/2013  ? Mixed hyperlipidemia 12/06/2013  ? Nonspecific abnormal results of cardiovascular function study 12/06/2013  ? Acute on chronic diastolic CHF (congestive heart failure) ([place]) 12/06/2013  ? Essential hypertension, benign 12/06/2013  ? AKI (acute kidney injury) ([place]) 03/17/2013  ? N&V (nausea and vomiting) 03/17/2013  ? Diarrhea 03/17/2013  ? DM (diabetes mellitus) ([place]) 03/17/2013  ? Hypotension 03/17/2013  ? Dizziness 03/17/2013   ? ? ?Palliative Care Assessment & Plan  ? ? ?Assessment/Recommendations/Plan ? ?Full comfort measures only ?Lorazepam '2mg'$   IV q4 hr schedule, lorazepam 2 mg IV q2 hr PRN anxiety ?Hydromorphone .'5mg'$  IV q15 min prn for SOB, discomfort, agitation ?Haldol .'5mg'$   IV q4 hr PRN agitation ?Glycopyrrolate .'2mg'$  IV q4hr prn secretions ?TOC referral for inpatient hospice house placement, awaiting bed availability at [facility] ? ?Code Status: ?DNR ? ?Prognosis: ? < 2 weeks ? ?Discharge Planning: ?Hospice facility ? ?Care plan was discussed with family and care team. ? ?Thank you for allowing the Palliative Medicine Team to assist in the care of this patient. ? ? ?Mariana Kaufman, AGNP-C ?Palliative Medicine ? ? ?Please contact Palliative Medicine Team phone at [phone] for questions and concerns.  ? ? ? ? ? ? ?

## 2021-10-19 NOTE — Progress Notes (Signed)
?PROGRESS NOTE ? ? ? ?Luke [name]  RUE:[id number] DOB: November 03, 1932 DOA: 10/14/2021 ?PCP: Aretta Nip, MD  ? ?Brief Narrative:  ?88 year old male with a history of CAD status post stenting/CABG, chronic diastolic CHF, diabetes mellitus type 2, CKD stage IIIb, hyperlipidemia, COPD, cognitive impairment with recent multiple hospitalizations presented with fall at home and confusion.  On presentation, his saturation was 88% on room air.  He was admitted for multifactorial respiratory failure and encephalopathy.  He has been treated with IV antibiotics and Lasix.  Cardiology was initially consulted who has signed off.  Lasix was initially discontinued because of AKI but after improvement in renal function, he has been currently on Lasix drip.  Palliative care was also consulted for goals of care discussion.  Because of progressive worsening of overall condition, family decided to pursue comfort measures on 10/18/2021.  They are agreeable for residential hospice placement. ? ?Assessment & Plan: ?  ?Comfort measures only  ?acute metabolic encephalopathy ?Agitation/delirium ?Acute hypoxic and hypercapnic respiratory failure ?Possible aspiration pneumonia ?Acute on chronic diastolic congestive heart failure ?COPD ?Probable undiagnosed obstructive sleep apnea ?Acute kidney injury on stroke chronic kidney disease stage IIIb ?Hypokalemia ?Hypernatremia ?Diabetes mellitus type 2 with episodes of hyperglycemia and hypoglycemia ?Leukopenia ?Goals of care ? ?Plan ?-As discussed above, family decided to pursue comfort measures on 10/18/2021.  Awaiting residential hospice placement. ? ?DVT prophylaxis: None for comfort measures  ?code Status: DNR ?Family Communication: Spoke to son on phone on 10/18/2021 ?Disposition Plan: ?Status is: Inpatient ?Remains inpatient appropriate because: Need for residential hospice placement. ? ?Consultants: Palliative care/cardiology, signed off ? ?Procedures: None ? ?Antimicrobials: Unasyn from  10/13/2021-10/18/2021 ? ? ?Subjective: ?Patient seen and examined at bedside.  Extremely poor historian.  Required Haldol and Ativan last night.  Confused.  Currently not agitated. ? Objective: ?Vitals:  ? 10/18/21 1033 10/18/21 1238 10/18/21 2000 10/18/21 2007  ?BP:    (!) 146/65  ?Pulse:    76  ?Resp: 20   (!) 24  ?Temp:  (!) 97.3 ?F (36.3 ?C) 98.2 ?F (36.8 ?C)   ?TempSrc:  Axillary Axillary   ?SpO2:    96%  ?Weight:      ?Height:      ? ? ?Intake/Output Summary (Last 24 hours) at 10/19/2021 0721 ?Last data filed at 10/19/2021 0600 ?Gross per 24 hour  ?Intake 575.94 ml  ?Output 1100 ml  ?Net -524.06 ml  ? ? ?Filed Weights  ? 10/16/21 0353 10/17/21 0500 10/18/21 0500  ?Weight: 95 kg 93 kg 92.9 kg  ? ? ?Examination: ? ?General: Elderly male lying in bed.  On 2 L oxygen via nasal cannula.  Confused.  Currently not agitated.  No distress currently. ? ? ?Data Reviewed: I have personally reviewed following labs and imaging studies ? ?CBC: ?Recent Labs  ?Lab 10/14/21 ?0244 10/15/21 ?0241 10/16/21 ?0259 10/17/21 ?1478 10/18/21 ?0256  ?WBC 5.5 3.6* 3.1* 3.8* 3.9*  ?NEUTROABS 3.9 2.3 1.9 2.5 2.5  ?HGB 12.1* 12.1* 12.6* 12.6* 13.2  ?HCT 39.8 39.9 40.4 40.7 41.9  ?MCV 99.7 99.0 98.5 97.6 95.9  ?PLT 128* 123* 127* 142* 156  ? ? ?Basic Metabolic Panel: ?Recent Labs  ?Lab 10/13/21 ?0304 10/14/21 ?2956 10/15/21 ?2130 10/16/21 ?8657 10/17/21 ?8469 10/18/21 ?0256  ?NA 137 138 141 144 148* 150*  ?K 4.0 3.7 3.7 3.5 3.3* 3.2*  ?CL 94* 99 103 105 104 106  ?CO2 34* 32 33* 33* 34* 36*  ?GLUCOSE 146* 144* 115* 119* 71 85  ?BUN 65* 61* 59*  57* 49* 45*  ?CREATININE 2.18* 1.70* 1.41* 1.39* 1.35* 1.34*  ?CALCIUM 8.1* 8.0* 8.5* 8.5* 8.7* 8.9  ?MG 2.7* 2.6* 2.8*  --   --  2.4  ?PHOS  --   --  4.2  --   --   --   ? ? ?GFR: ?Estimated Creatinine Clearance: 46.1 mL/min (A) (by C-G formula based on SCr of 1.34 mg/dL (H)). ?Liver Function Tests: ?Recent Labs  ?Lab 10/13/21 ?0304 10/14/21 ?5409  ?AST 22 18  ?ALT 15 15  ?ALKPHOS 46 42  ?BILITOT 0.8 0.6   ?PROT 6.2* 5.9*  ?ALBUMIN 3.6 3.6  ? ? ?No results for input(s): LIPASE, AMYLASE in the last 168 hours. ?No results for input(s): AMMONIA in the last 168 hours. ?Coagulation Profile: ?No results for input(s): INR, PROTIME in the last 168 hours. ?Cardiac Enzymes: ?No results for input(s): CKTOTAL, CKMB, CKMBINDEX, TROPONINI in the last 168 hours. ?BNP (last 3 results) ?No results for input(s): PROBNP in the last 8760 hours. ?HbA1C: ?No results for input(s): HGBA1C in the last 72 hours. ?CBG: ?Recent Labs  ?Lab 10/17/21 ?2005 10/17/21 ?2342 10/18/21 ?8119 10/18/21 ?1478 10/18/21 ?1127  ?GLUCAP 119* 95 80 84 110*  ? ? ?Lipid Profile: ?No results for input(s): CHOL, HDL, LDLCALC, TRIG, CHOLHDL, LDLDIRECT in the last 72 hours. ?Thyroid Function Tests: ?No results for input(s): TSH, T4TOTAL, FREET4, T3FREE, THYROIDAB in the last 72 hours. ?Anemia Panel: ?No results for input(s): VITAMINB12, FOLATE, FERRITIN, TIBC, IRON, RETICCTPCT in the last 72 hours. ?Sepsis Labs: ?No results for input(s): PROCALCITON, LATICACIDVEN in the last 168 hours. ? ?Recent Results (from the past 240 hour(s))  ?Resp Panel by RT-PCR (Flu A&B, Covid) Nasopharyngeal Swab     Status: None  ? Collection Time: 10/03/2021 12:59 PM  ? Specimen: Nasopharyngeal Swab; Nasopharyngeal(NP) swabs in vial transport medium  ?Result Value Ref Range Status  ? SARS Coronavirus 2 by RT PCR NEGATIVE NEGATIVE Final  ?  Comment: (NOTE) ?SARS-CoV-2 target nucleic acids are NOT DETECTED. ? ?The SARS-CoV-2 RNA is generally detectable in upper respiratory ?specimens during the acute phase of infection. The lowest ?concentration of SARS-CoV-2 viral copies this assay can detect is ?138 copies/mL. A negative result does not preclude SARS-Cov-2 ?infection and should not be used as the sole basis for treatment or ?other patient management decisions. A negative result may occur with  ?improper specimen collection/handling, submission of specimen other ?than nasopharyngeal swab,  presence of viral mutation(s) within the ?areas targeted by this assay, and inadequate number of viral ?copies(<138 copies/mL). A negative result must be combined with ?clinical observations, patient history, and epidemiological ?information. The expected result is Negative. ? ?Fact Sheet for Patients:  ?EntrepreneurPulse.com.au ? ?Fact Sheet for Healthcare Providers:  ?IncredibleEmployment.be ? ?This test is no t yet approved or cleared by the [place] FDA and  ?has been authorized for detection and/or diagnosis of SARS-CoV-2 by ?FDA under an Emergency Use Authorization (EUA). This EUA will remain  ?in effect (meaning this test can be used) for the duration of the ?COVID-19 declaration under Section 564(b)(1) of the Act, 21 ?U.S.C.section 360bbb-3(b)(1), unless the authorization is terminated  ?or revoked sooner.  ? ? ?  ? Influenza A by PCR NEGATIVE NEGATIVE Final  ? Influenza B by PCR NEGATIVE NEGATIVE Final  ?  Comment: (NOTE) ?The Xpert Xpress SARS-CoV-2/FLU/RSV plus assay is intended as an aid ?in the diagnosis of influenza from Nasopharyngeal swab specimens and ?should not be used as a sole basis for treatment. Nasal washings and ?  aspirates are unacceptable for Xpert Xpress SARS-CoV-2/FLU/RSV ?testing. ? ?Fact Sheet for Patients: ?EntrepreneurPulse.com.au ? ?Fact Sheet for Healthcare Providers: ?IncredibleEmployment.be ? ?This test is not yet approved or cleared by the [place] FDA and ?has been authorized for detection and/or diagnosis of SARS-CoV-2 by ?FDA under an Emergency Use Authorization (EUA). This EUA will remain ?in effect (meaning this test can be used) for the duration of the ?COVID-19 declaration under Section 564(b)(1) of the Act, 21 U.S.C. ?section 360bbb-3(b)(1), unless the authorization is terminated or ?revoked. ? ?Performed at [facility], [address] ?[place], [place] [address] ?  ?MRSA  Next Gen by PCR, Nasal     Status: Abnormal  ? Collection Time: 09/26/2021 10:11 PM  ? Specimen: Nasal Mucosa; Nasal Swab  ?Result Value Ref Range Status  ? MRSA by PCR Next Gen DETECTED (A) NOT DETECTED F

## 2021-10-19 NOTE — TOC Progression Note (Addendum)
Transition of Care (TOC) - Progression Note  ? ? ?Patient Details  ?Name: Luke [name] ?MRN: [id number] ?Date of Birth: [birth year] ? ?Transition of Care (TOC) CM/SW Contact  ?[place], LCSW ?Phone Number: ?10/19/2021, 9:47 AM ? ?Clinical Narrative:    ? ?CSW called pt son to confirm choice of [facility] per PMT note. Son confirms family would like [employer]. CSW made referral to [facility]. Pt will be reviewed for admission pending bed availability.  ? ?1200: CSW is notified there are no [facility] beds at this time. They will reach out if bed becomes available this afternoon or tomorrow.  ? ?Expected Discharge Plan: [place] ?Barriers to Discharge:  ([place] pending bed availability) ? ?Expected Discharge Plan and Services ?Expected Discharge Plan: [place] ?  ?Discharge Planning Services: CM Consult ?Post Acute Care Choice: Hospice ?Living arrangements for the past 2 months: [place] ?                ?  ?  ?  ?  ?  ?  ?  ?  ?  ?  ? ? ?Social Determinants of Health (SDOH) Interventions ?  ? ?Readmission Risk Interventions ? ?  04/12/2021  ? 11:06 AM  ?Readmission Risk Prevention Plan  ?Transportation Screening Complete  ?PCP or Specialist Appt within 5-7 Days Complete  ?Home Care Screening Complete  ?Medication Review (RN CM) Complete  ? ? ?

## 2021-10-19 NOTE — Progress Notes (Signed)
Pt stable at this time. Pt on 2L [place]. Son & Wife at bedside. Report called ?

## 2021-10-20 DIAGNOSIS — J9601 Acute respiratory failure with hypoxia: Secondary | ICD-10-CM | POA: Diagnosis not present

## 2021-10-20 DIAGNOSIS — G9341 Metabolic encephalopathy: Secondary | ICD-10-CM | POA: Diagnosis not present

## 2021-10-20 DIAGNOSIS — Z515 Encounter for palliative care: Secondary | ICD-10-CM | POA: Diagnosis not present

## 2021-10-20 DIAGNOSIS — J449 Chronic obstructive pulmonary disease, unspecified: Secondary | ICD-10-CM | POA: Diagnosis not present

## 2021-10-20 MED ORDER — HYDROMORPHONE HCL 1 MG/ML IJ SOLN: 1.0000 mg | Freq: Four times a day (QID) | INTRAMUSCULAR | Status: DC

## 2021-10-20 MED ORDER — HYDROMORPHONE HCL 1 MG/ML IJ SOLN
1.0000 mg | INTRAMUSCULAR | Status: AC
  Administered 2021-10-20: 1 mg via INTRAVENOUS
  Filled 2021-10-20: qty 1

## 2021-10-20 MED ORDER — GLYCOPYRROLATE 0.2 MG/ML IJ SOLN: 0.2000 mg | INTRAMUSCULAR | Status: DC

## 2021-10-20 MED ORDER — HYDROMORPHONE HCL 1 MG/ML IJ SOLN: 1.0000 mg | INTRAMUSCULAR | Status: DC | PRN

## 2021-10-23 NOTE — Progress Notes (Signed)
Spoke to [employer], Medical Examiner and he said the patient would not be a medical examiner case.   ?

## 2021-10-23 NOTE — Discharge Summary (Signed)
Physician Discharge Summary  ?Luke [name] XNA:[id number] DOB: 1933/01/11 DOA: 10/02/2021 ? ?PCP: Aretta Nip, MD ? ?Admit date: 09/22/2021 ?Discharge date: Oct 26, 2021 ? ?Admitted From: Home ?Disposition:  Home Hospice  ? ? ? ? ?Brief/Interim Summary: ?88 year old male with a history of CAD status post stenting/CABG, chronic diastolic CHF, diabetes mellitus type 2, CKD stage IIIb, hyperlipidemia, COPD, cognitive impairment with recent multiple hospitalizations presented with fall at home and confusion.  On presentation, his saturation was 88% on room air.  He was admitted for multifactorial respiratory failure and encephalopathy.  He has been treated with IV antibiotics and Lasix.  Cardiology was initially consulted who has signed off.  Lasix was initially discontinued because of AKI but after improvement in renal function, he has been currently on Lasix drip.  Palliative care was also consulted for goals of care discussion.  Because of progressive worsening of overall condition, family decided to pursue comfort measures on 10/18/2021.  They are agreeable for residential hospice placement. ?  ?  ?  ?Assessment & Plan: ? Active Problems: ?  AKI (acute kidney injury) ([place]) ?  Acute on chronic diastolic CHF (congestive heart failure) ([place]) ?  COPD (chronic obstructive pulmonary disease) ([place]) ?  CKD (chronic kidney disease), stage III ([place]) ?  Type 2 diabetes mellitus with hyperglycemia, with long-term current use of insulin ([place]) ?  Acute respiratory failure with hypoxia and hypercapnia (H[facility]) ?  Multifocal pneumonia ?  Acute metabolic encephalopathy ?  Delirium ?  Pancytopenia ([place]) ?  Comfort measures only status ?  ?Comfort measures only  ?acute metabolic encephalopathy ?Agitation/delirium ?Acute hypoxic and hypercapnic respiratory failure ?Possible aspiration pneumonia ?Acute on chronic diastolic congestive heart failure ?COPD ?Probable undiagnosed obstructive sleep apnea ?Acute kidney injury on stroke  chronic kidney disease stage IIIb ?Hypokalemia ?Hypernatremia ?Diabetes mellitus type 2 with episodes of hyperglycemia and hypoglycemia ?Leukopenia ?Goals of care ?  ?Plan ?-As discussed above, family decided to pursue comfort measures on 10/18/2021.  Awaiting residential hospice placement. ?  ? ? ? ?Discharge Diagnoses:  ?Active Problems: ?  AKI (acute kidney injury) ([place]) ?  Acute on chronic diastolic CHF (congestive heart failure) ([place]) ?  COPD (chronic obstructive pulmonary disease) ([place]) ?  CKD (chronic kidney disease), stage III ([place]) ?  Type 2 diabetes mellitus with hyperglycemia, with long-term current use of insulin ([place]) ?  Acute respiratory failure with hypoxia and hypercapnia (HCC) ?  Multifocal pneumonia ?  Acute metabolic encephalopathy ?  Delirium ?  Pancytopenia ([place]) ?  Comfort measures only status ? ? ? ? ? ?Consultations: ?Hospice ? ?Subjective: ?Resting no complaints.  ? ?Discharge Exam: ?Vitals:  ? 10/19/21 1400 10/26/2021 0544  ?BP:  132/80  ?Pulse: 88 85  ?Resp: (!) 28 16  ?Temp:  98.4 ?F (36.9 ?C)  ?SpO2: 94% 95%  ? ?Vitals:  ? 10/19/21 1200 10/19/21 1300 10/19/21 1400 10-26-2021 0544  ?BP:    132/80  ?Pulse: 85 84 88 85  ?Resp: (!) 21 (!) 22 (!) 28 16  ?Temp:    98.4 ?F (36.9 ?C)  ?TempSrc:    Oral  ?SpO2: 93% 96% 94% 95%  ?Weight:      ?Height:      ? ? ? ?Discharge Instructions ? ? ?Allergies as of 2021-10-26   ?No Known Allergies ?  ? ?  ?Medication List  ?  ? ?STOP taking these medications   ? ?acetaminophen 500 MG tablet ?Commonly known as: TYLENOL ?  ?Adult One Daily Gummies Chew ?  ?  albuterol 108 (90 Base) MCG/ACT inhaler ?Commonly known as: VENTOLIN HFA ?  ?aspirin EC 81 MG tablet ?  ?cyanocobalamin 1000 MCG tablet ?  ?[place] ?  ?furosemide 80 MG tablet ?Commonly known as: LASIX ?  ?Glucosamine-Chondroitin-MSM 500-200-150 MG Tabs ?  ?insulin aspart 100 UNIT/ML injection ?Commonly known as: novoLOG ?  ?insulin detemir 100 UNIT/ML injection ?Commonly known as:  LEVEMIR ?  ?nitroGLYCERIN 0.4 MG SL tablet ?Commonly known as: NITROSTAT ?  ?polyvinyl alcohol 1.4 % ophthalmic solution ?Commonly known as: LIQUIFILM TEARS ?  ?rosuvastatin 20 MG tablet ?Commonly known as: CRESTOR ?  ?Vitamin D3 50 MCG (2000 UT) Tabs ?  ? ?  ? ? Follow-up Information   ? ? Rankins, Bill Salinas, MD Follow up in 1 week(s).   ?Specialty: Family Medicine ?Contact information: ?[address] ?[place] [address] ?[phone] ? ? ?  ?  ? ? Jettie Booze, MD .   ?Specialties: Cardiology, Radiology, Interventional Cardiology ?Contact information: ?[address] ?[address] ?[place] [address] ?[phone] ? ? ?  ?  ? ?  ?  ? ?  ? ?No Known Allergies ? ?You were cared for by a hospitalist during your hospital stay. If you have any questions about your discharge medications or the care you received while you were in the hospital after you are discharged, you can call the unit and asked to speak with the hospitalist on call if the hospitalist that took care of you is not available. Once you are discharged, your primary care physician will handle any further medical issues. Please note that no refills for any discharge medications will be authorized once you are discharged, as it is imperative that you return to your primary care physician (or establish a relationship with a primary care physician if you do not have one) for your aftercare needs so that they can reassess your need for medications and monitor your lab values. ? ? ?Procedures/Studies: ?DG Chest 2 View ? ?Result Date: 09/27/2021 ?CLINICAL DATA:  [place] out of bed last night. EXAM: CHEST - 2 VIEW COMPARISON:  Chest x-ray dated April 10, 2021. FINDINGS: The heart size and mediastinal contours are within normal limits. Prior CABG. Trace left pleural effusion. No consolidation or pneumothorax. No acute osseous abnormality. IMPRESSION: 1. Trace left pleural effusion. Electronically Signed   By: Titus Dubin M.D.   On:  10/07/2021 13:41  ? ?CT HEAD WO CONTRAST (5MM) ? ?Result Date: 10/15/2021 ?CLINICAL DATA:  Altered mental status EXAM: CT HEAD WITHOUT CONTRAST TECHNIQUE: Contiguous axial images were obtained from the base of the skull through the vertex without intravenous contrast. RADIATION DOSE REDUCTION: This exam was performed according to the departmental dose-optimization program which includes automated exposure control, adjustment of the mA and/or kV according to patient size and/or use of iterative reconstruction technique. COMPARISON:  10/03/2021 FINDINGS: Severe motion degradation. Brain: Within the limitation of the severe motion, there is no visible acute hemorrhage. There is generalized atrophy with chronic microvascular ischemia. Vascular: Calcific atherosclerosis Skull: Normal. Negative for fracture or focal lesion. Sinuses/Orbits: No acute finding. Other: None. IMPRESSION: Severe motion degradation without visible acute hemorrhage. Electronically Signed   By: Ulyses Jarred M.D.   On: 10/15/2021 00:49  ? ?CT Head Wo Contrast ? ?Result Date: 10/13/2021 ?CLINICAL DATA:  Head trauma, moderate-severe; Neck trauma (Age >= 65y) EXAM: CT HEAD WITHOUT CONTRAST CT CERVICAL SPINE WITHOUT CONTRAST TECHNIQUE: Multidetector CT imaging of the head and cervical spine was performed  following the standard protocol without intravenous contrast. Multiplanar CT image reconstructions of the cervical spine were also generated. RADIATION DOSE REDUCTION: This exam was performed according to the departmental dose-optimization program which includes automated exposure control, adjustment of the mA and/or kV according to patient size and/or use of iterative reconstruction technique. COMPARISON:  04/10/2021. FINDINGS: CT HEAD FINDINGS Mildly motion limited study. Brain: No evidence of acute large vascular territory infarction, hemorrhage, hydrocephalus, extra-axial collection or mass lesion/mass effect. Remote right parietal infarct.  Additional patchy white matter hypoattenuation, nonspecific but compatible with chronic microvascular disease. Cerebral atrophy. Vascular: Calcific intracranial atherosclerosis. No hyperdense vessel identified. Skull: No acut

## 2021-10-23 NOTE — Progress Notes (Signed)
?PROGRESS NOTE ? ? ? ?Luke [name]  OZD:[id number] DOB: Sep 23, 1932 DOA: 10/13/2021 ?PCP: Aretta Nip, MD  ? ?Brief Narrative:  ?88 year old male with a history of CAD status post stenting/CABG, chronic diastolic CHF, diabetes mellitus type 2, CKD stage IIIb, hyperlipidemia, COPD, cognitive impairment with recent multiple hospitalizations presented with fall at home and confusion.  On presentation, his saturation was 88% on room air.  He was admitted for multifactorial respiratory failure and encephalopathy.  He has been treated with IV antibiotics and Lasix.  Cardiology was initially consulted who has signed off.  Lasix was initially discontinued because of AKI but after improvement in renal function, he has been currently on Lasix drip.  Palliative care was also consulted for goals of care discussion.  Because of progressive worsening of overall condition, family decided to pursue comfort measures on 10/18/2021.  They are agreeable for residential hospice placement. ?  ? ? ?Assessment & Plan: ? Active Problems: ?  AKI (acute kidney injury) ([place]) ?  Acute on chronic diastolic CHF (congestive heart failure) ([place]) ?  COPD (chronic obstructive pulmonary disease) ([place]) ?  CKD (chronic kidney disease), stage III ([place]) ?  Type 2 diabetes mellitus with hyperglycemia, with long-term current use of insulin ([place]) ?  Acute respiratory failure with hypoxia and hypercapnia (HCC) ?  Multifocal pneumonia ?  Acute metabolic encephalopathy ?  Delirium ?  Pancytopenia ([place]) ?  Comfort measures only status ?  ?Comfort measures only  ?acute metabolic encephalopathy ?Agitation/delirium ?Acute hypoxic and hypercapnic respiratory failure ?Possible aspiration pneumonia ?Acute on chronic diastolic congestive heart failure ?COPD ?Probable undiagnosed obstructive sleep apnea ?Acute kidney injury on stroke chronic kidney disease stage IIIb ?Hypokalemia ?Hypernatremia ?Diabetes mellitus type 2 with episodes of hyperglycemia and  hypoglycemia ?Leukopenia ?Goals of care ?  ?Plan ?-As discussed above, family decided to pursue comfort measures on 10/18/2021.  Awaiting residential hospice placement. ? ? ?DVT prophylaxis: Comfort measures ?Code Status: DNR ?Family Communication:   ? ?Awaiting placement at residential hospice ?Nutritional status ? ? ? ? ?Subjective: ? ?Resting no complaints.  ? ? ? ?Objective: ?Vitals:  ? 10/19/21 1200 10/19/21 1300 10/19/21 1400 2021/11/11 0544  ?BP:    132/80  ?Pulse: 85 84 88 85  ?Resp: (!) 21 (!) 22 (!) 28 16  ?Temp:    98.4 ?F (36.9 ?C)  ?TempSrc:    Oral  ?SpO2: 93% 96% 94% 95%  ?Weight:      ?Height:      ? ? ?Intake/Output Summary (Last 24 hours) at November 11, 2021 0747 ?Last data filed at 11/11/21 0553 ?Gross per 24 hour  ?Intake --  ?Output 250 ml  ?Net -250 ml  ? ?Filed Weights  ? 10/16/21 0353 10/17/21 0500 10/18/21 0500  ?Weight: 95 kg 93 kg 92.9 kg  ? ? ? ?Data Reviewed:  ? ?CBC: ?Recent Labs  ?Lab 10/14/21 ?0244 10/15/21 ?0241 10/16/21 ?0259 10/17/21 ?2595 10/18/21 ?0256  ?WBC 5.5 3.6* 3.1* 3.8* 3.9*  ?NEUTROABS 3.9 2.3 1.9 2.5 2.5  ?HGB 12.1* 12.1* 12.6* 12.6* 13.2  ?HCT 39.8 39.9 40.4 40.7 41.9  ?MCV 99.7 99.0 98.5 97.6 95.9  ?PLT 128* 123* 127* 142* 156  ? ?Basic Metabolic Panel: ?Recent Labs  ?Lab 10/14/21 ?0244 10/15/21 ?0241 10/16/21 ?0259 10/17/21 ?6387 10/18/21 ?0256  ?NA 138 141 144 148* 150*  ?K 3.7 3.7 3.5 3.3* 3.2*  ?CL 99 103 105 104 106  ?CO2 32 33* 33* 34* 36*  ?GLUCOSE 144* 115* 119* 71 85  ?BUN 61* 59* 57*  49* 45*  ?CREATININE 1.70* 1.41* 1.39* 1.35* 1.34*  ?CALCIUM 8.0* 8.5* 8.5* 8.7* 8.9  ?MG 2.6* 2.8*  --   --  2.4  ?PHOS  --  4.2  --   --   --   ? ?GFR: ?Estimated Creatinine Clearance: 46.1 mL/min (A) (by C-G formula based on SCr of 1.34 mg/dL (H)). ?Liver Function Tests: ?Recent Labs  ?Lab 10/14/21 ?0347  ?AST 18  ?ALT 15  ?ALKPHOS 42  ?BILITOT 0.6  ?PROT 5.9*  ?ALBUMIN 3.6  ? ?No results for input(s): LIPASE, AMYLASE in the last 168 hours. ?No results for input(s): AMMONIA in the last  168 hours. ?Coagulation Profile: ?No results for input(s): INR, PROTIME in the last 168 hours. ?Cardiac Enzymes: ?No results for input(s): CKTOTAL, CKMB, CKMBINDEX, TROPONINI in the last 168 hours. ?BNP (last 3 results) ?No results for input(s): PROBNP in the last 8760 hours. ?HbA1C: ?No results for input(s): HGBA1C in the last 72 hours. ?CBG: ?Recent Labs  ?Lab 10/17/21 ?2005 10/17/21 ?2342 10/18/21 ?4259 10/18/21 ?5638 10/18/21 ?1127  ?GLUCAP 119* 95 80 84 110*  ? ?Lipid Profile: ?No results for input(s): CHOL, HDL, LDLCALC, TRIG, CHOLHDL, LDLDIRECT in the last 72 hours. ?Thyroid Function Tests: ?No results for input(s): TSH, T4TOTAL, FREET4, T3FREE, THYROIDAB in the last 72 hours. ?Anemia Panel: ?No results for input(s): VITAMINB12, FOLATE, FERRITIN, TIBC, IRON, RETICCTPCT in the last 72 hours. ?Sepsis Labs: ?No results for input(s): PROCALCITON, LATICACIDVEN in the last 168 hours. ? ?No results found for this or any previous visit (from the past 240 hour(s)).  ? ? ? ? ? ?Radiology Studies: ?DG CHEST PORT 1 VIEW ? ?Result Date: 10/18/2021 ?CLINICAL DATA:  An 88 year old male presents for evaluation of dyspnea EXAM: PORTABLE CHEST 1 VIEW COMPARISON:  October 13, 2021. FINDINGS: EKG leads project over the patient's chest. Post median sternotomy for CABG. Image rotated slightly to the RIGHT, accounting for this cardiomediastinal contours and hilar structures are stable. Graded opacity noted in the LEFT and RIGHT chest similar to prior imaging. No lobar consolidative changes. On limited assessment there is no acute skeletal finding. IMPRESSION: Bilateral effusions similar to prior imaging. Substantial change from prior imaging. Post median sternotomy for CABG. Electronically Signed   By: Zetta Bills M.D.   On: 10/18/2021 08:07   ? ? ? ? ? ?Scheduled Meds: ? bisacodyl  10 mg Rectal Once  ? Chlorhexidine Gluconate Cloth  6 each Topical Q0600  ? LORazepam  2 mg Intravenous Q4H  ? mouth rinse  15 mL Mouth Rinse BID  ?  QUEtiapine  25 mg Oral BID  ? sodium chloride flush  10-40 mL Intracatheter Q12H  ? ?Continuous Infusions: ? ? LOS: 11 days  ? ?Time spent= 35 mins ? ? ? ?Rachelanne Whidby Arsenio Loader, MD ?[facility] ? ?If 7PM-7AM, please contact night-coverage ? ?Nov 04, 2021, 7:47 AM  ? ?

## 2021-10-23 NOTE — Death Summary Note (Signed)
? ?DEATH SUMMARY  ? ?Patient Details  ?Name: Luke [name] ?MRN: [id number] ?DOB: 10-10-1932 ?MOQ:[id number], Bill Salinas, MD ?Admission/Discharge Information  ? ?Admit Date:  November 08, 2021  ?Date of Death: Date of Death: 2021-11-19  ?Time of Death: Time of Death: 5035  ?Length of Stay: 11  ? ?Principle Cause of death: Acute hypoxic and hypercapnic respiratory failure ? ?Hospital Diagnoses: ?Active Problems: ?  AKI (acute kidney injury) ([place]) ?  Acute on chronic diastolic CHF (congestive heart failure) ([place]) ?  COPD (chronic obstructive pulmonary disease) ([place]) ?  CKD (chronic kidney disease), stage III ([place]) ?  Type 2 diabetes mellitus with hyperglycemia, with long-term current use of insulin ([place]) ?  Acute respiratory failure with hypoxia and hypercapnia (HCC) ?  Multifocal pneumonia ?  Acute metabolic encephalopathy ?  Delirium ?  Pancytopenia ([place]) ?  Comfort measures only status ? ? ?Hospital Course: ?88 year old male with a history of CAD status post stenting/CABG, chronic diastolic CHF, diabetes mellitus type 2, CKD stage IIIb, hyperlipidemia, COPD, cognitive impairment with recent multiple hospitalizations presented with fall at home and confusion.  On presentation, his saturation was 88% on room air.  He was admitted for multifactorial respiratory failure and encephalopathy.  He has been treated with IV antibiotics and Lasix.  Cardiology was initially consulted who has signed off.  Lasix was initially discontinued because of AKI but after improvement in renal function, he has been currently on Lasix drip.  Palliative care was also consulted for goals of care discussion.  Because of progressive worsening of overall condition, family decided to pursue comfort measures on 10/18/2021.  They are agreeable for residential hospice placement. ?  ?  ?  ?Assessment & Plan: ? Active Problems: ?  AKI (acute kidney injury) ([place]) ?  Acute on chronic diastolic CHF (congestive heart failure) ([place]) ?  COPD (chronic obstructive  pulmonary disease) ([place]) ?  CKD (chronic kidney disease), stage III ([place]) ?  Type 2 diabetes mellitus with hyperglycemia, with long-term current use of insulin ([place]) ?  Acute respiratory failure with hypoxia and hypercapnia (HCC) ?  Multifocal pneumonia ?  Acute metabolic encephalopathy ?  Delirium ?  Pancytopenia ([place]) ?  Comfort measures only status ?  ?Comfort measures only  ?acute metabolic encephalopathy ?Agitation/delirium ?Acute hypoxic and hypercapnic respiratory failure ?Possible aspiration pneumonia ?Acute on chronic diastolic congestive heart failure ?COPD ?Probable undiagnosed obstructive sleep apnea ?Acute kidney injury on stroke chronic kidney disease stage IIIb ?Hypokalemia ?Hypernatremia ?Diabetes mellitus type 2 with episodes of hyperglycemia and hypoglycemia ?Leukopenia ?Goals of care ?  ?Plan ?-As discussed above, family decided to pursue comfort measures on 10/18/2021.  Awaiting residential hospice placement. ?  ?  ? ?The results of significant diagnostics from this hospitalization (including imaging, microbiology, ancillary and laboratory) are listed below for reference.  ? ?Significant Diagnostic Studies: ?DG Chest 2 View ? ?Result Date: 08-Nov-2021 ?CLINICAL DATA:  [place] out of bed last night. EXAM: CHEST - 2 VIEW COMPARISON:  Chest x-ray dated April 10, 2021. FINDINGS: The heart size and mediastinal contours are within normal limits. Prior CABG. Trace left pleural effusion. No consolidation or pneumothorax. No acute osseous abnormality. IMPRESSION: 1. Trace left pleural effusion. Electronically Signed   By: Titus Dubin M.D.   On: 2021-11-08 13:41  ? ?CT HEAD WO CONTRAST (5MM) ? ?Result Date: 10/15/2021 ?CLINICAL DATA:  Altered mental status EXAM: CT HEAD WITHOUT CONTRAST TECHNIQUE: Contiguous axial images were obtained from the base of the skull through the vertex without intravenous contrast. RADIATION DOSE  REDUCTION: This exam was performed according to the departmental dose-optimization  program which includes automated exposure control, adjustment of the mA and/or kV according to patient size and/or use of iterative reconstruction technique. COMPARISON:  10/01/2021 FINDINGS: Severe motion degradation. Brain: Within the limitation of the severe motion, there is no visible acute hemorrhage. There is generalized atrophy with chronic microvascular ischemia. Vascular: Calcific atherosclerosis Skull: Normal. Negative for fracture or focal lesion. Sinuses/Orbits: No acute finding. Other: None. IMPRESSION: Severe motion degradation without visible acute hemorrhage. Electronically Signed   By: Ulyses Jarred M.D.   On: 10/15/2021 00:49  ? ?CT Head Wo Contrast ? ?Result Date: 10/16/2021 ?CLINICAL DATA:  Head trauma, moderate-severe; Neck trauma (Age >= 65y) EXAM: CT HEAD WITHOUT CONTRAST CT CERVICAL SPINE WITHOUT CONTRAST TECHNIQUE: Multidetector CT imaging of the head and cervical spine was performed following the standard protocol without intravenous contrast. Multiplanar CT image reconstructions of the cervical spine were also generated. RADIATION DOSE REDUCTION: This exam was performed according to the departmental dose-optimization program which includes automated exposure control, adjustment of the mA and/or kV according to patient size and/or use of iterative reconstruction technique. COMPARISON:  04/10/2021. FINDINGS: CT HEAD FINDINGS Mildly motion limited study. Brain: No evidence of acute large vascular territory infarction, hemorrhage, hydrocephalus, extra-axial collection or mass lesion/mass effect. Remote right parietal infarct. Additional patchy white matter hypoattenuation, nonspecific but compatible with chronic microvascular disease. Cerebral atrophy. Vascular: Calcific intracranial atherosclerosis. No hyperdense vessel identified. Skull: No acute fracture. Sinuses/Orbits: Clear sinuses.  No acute orbital findings Other: No mastoid effusions. CT CERVICAL SPINE FINDINGS Alignment: No  substantial sagittal subluxation. Broad levocurvature. Skull base and vertebrae: Vertebral body heights are maintained. No evidence of acute fracture. Soft tissues and spinal canal: No prevertebral fluid or swelling. No visible canal hematoma. Disc levels: Moderate multilevel degenerative disc disease. Multilevel facet uncovertebral hypertrophy with varying degrees of neural foraminal stenosis. Upper chest: Biapical pleuroparenchymal scarring. No consolidation in the visualized lung apices. Layering left pleural effusion. Other: Calcific atherosclerosis, including at bilateral carotid bifurcations. IMPRESSION: CT head: 1. Motion limited without evidence of acute intracranial abnormality. 2. 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 CT cervical spine: 1. Motion limited without evidence of acute fracture or traumatic malalignment. 2. Multilevel degenerative change, detailed above. 3. Layering left pleural effusion. Electronically Signed   By: Margaretha Sheffield M.D.   On: 10/11/2021 14:08  ? ?CT Angio Chest PE W and/or Wo Contrast ? ?Result Date: 10/22/2021 ?CLINICAL DATA:  Pulmonary embolism (PE) suspected, high prob Shortness of breath.  Fall from bed last night. EXAM: CT ANGIOGRAPHY CHEST WITH CONTRAST TECHNIQUE: Multidetector CT imaging of the chest was performed using the standard protocol during bolus administration of intravenous contrast. Multiplanar CT image reconstructions and MIPs were obtained to evaluate the vascular anatomy. RADIATION DOSE REDUCTION: This exam was performed according to the departmental dose-optimization program which includes automated exposure control, adjustment of the mA and/or kV according to patient size and/or use of iterative reconstruction technique. CONTRAST:  65m OMNIPAQUE IOHEXOL 350 MG/ML SOLN COMPARISON:  Radiograph earlier today FINDINGS: Cardiovascular: Mediastinum/Nodes: There are no filling defects within the pulmonary arteries to suggest  pulmonary embolus. Motion artifact limits basilar assessment. Patient is post median sternotomy and CABG. Dense calcifications of native coronary arteries. Moderate aortic atherosclerosis without aneurysm. Mild car

## 2021-10-23 NOTE — Progress Notes (Signed)
Went into room to check on patient and sons and wife were at bedside and said they thought he just passed.  No respirations or pulse. 2nd RN verified.  MD and NP notified.  Family at bedside at this time. ? ?

## 2021-10-23 NOTE — Care Management Important Message (Signed)
Important Message ? ?Patient Details Hospice ?Name: Linus Weckerly Gora ?MRN: [id number] ?Date of Birth: [birth year] ? ? ?Medicare Important Message Given:  No ? ? ? ? ?Kerin Salen ?10-Nov-2021, 9:46 AM ?

## 2021-10-23 NOTE — Progress Notes (Signed)
? ?                                                                                                                                                 ?                                                   ?Daily Progress Note  ? ?Patient Name: Luke [name]       Date: 11-Nov-2021 ?DOB: [birth year]  Age: 88 y.o. MRN#: [id number] ?Attending Physician: Damita Lack, MD ?Primary Care Physician: Aretta Nip, MD ?Admit Date: 10/05/2021 ? ?Reason for Consultation/Follow-up: Establishing goals of care ? ?Patient Profile/HPI:  88 y.o. male  with past medical history of mild cognitive impairment, CAD, CABG 1999, HFpEF, HTN, HLD, COPD, diabetes, CKD stage 3b admitted on 10/05/2021 from home where he lives with his wife with fall with loss of consciousness due to heart failure exacerbation, likely underlying OSA, underlying COPD. Unfortunately it seems that he had known fluid overload with prescription for Lasix but was noted to not be taking Lasix and also noted to have previously refused oxygen from pulmonary.  ? ?Subjective: ?Sleeping but a bit restless. Terminal secretions audible. RR increased, labored. [place] at 1244. Hydromorphone 0.'5mg'$  this morning around 1am. Haldol 2 mg this afternoon at 1345.  ?No family at bedside.  ?Continue to wait for hospice bed.   ? ?Review of Systems  ?Unable to perform ROS: Mental acuity  ? ? ?Physical Exam ?Vitals and nursing note reviewed.  ?Constitutional:   ?   Appearance: He is ill-appearing.  ?Pulmonary:  ?   Breath sounds: Rales present.  ?   Comments: Increased effort ?Skin: ?   General: Skin is warm and dry.  ?   Coloration: Skin is not cyanotic.  ?   Comments: No mottling  ?Psychiatric:     ?   Behavior: Behavior is agitated.  ?         ?Vital Signs: BP 132/80 (BP Location: Left Arm)   Pulse 85   Temp 98.4 ?F (36.9 ?C) (Oral)   Resp 16   Ht '5\' 11"'$  (1.803 m)   Wt 92.9 kg   SpO2 95%   BMI 28.56 kg/m?  ?SpO2: SpO2: 95 % ?O2 Device: O2 Device: Nasal Cannula ?O2 Flow Rate:  O2 Flow Rate (L/min): 2 L/min ? ?Intake/output summary:  ?Intake/Output Summary (Last 24 hours) at 11/11/21 1426 ?Last data filed at 11/11/2021 0900 ?Gross per 24 hour  ?Intake 0 ml  ?Output 250 ml  ?Net -250 ml  ? ? ?LBM: Last BM Date :  (3/23 per previous RN) ?Baseline Weight: Weight: 95.5 kg ?Most recent weight: Weight: 92.9 kg ? ?     ?  Palliative Assessment/Data: PPS: 10% ? ? ? ? ?Patient Active Problem List  ? Diagnosis Date Noted  ? Comfort measures only status 10/19/2021  ? Acute respiratory failure with hypoxia and hypercapnia ([place]) 10/17/2021  ? Multifocal pneumonia 10/17/2021  ? Acute metabolic encephalopathy 05/10/5101  ? Delirium 10/17/2021  ? Pancytopenia ([place]) 10/17/2021  ? Recurrent cellulitis of lower leg 08/27/2021  ? Acute encephalopathy 04/10/2021  ? B12 deficiency 11/23/2020  ? Vitamin D deficiency 11/23/2020  ? Falls 11/23/2020  ? Syncope and collapse 11/23/2020  ? Hypoglycemia 11/23/2020  ? COPD with acute exacerbation ([place]) 11/23/2020  ? COPD (chronic obstructive pulmonary disease) ([place]) 09/18/2017  ? Hypoxemia 09/18/2017  ? Primary hypothyroidism 09/19/2016  ? CKD (chronic kidney disease), stage III ([place]) 09/19/2016  ? Type 2 diabetes mellitus with hyperglycemia, with long-term current use of insulin ([place]) 09/19/2016  ? CRI (chronic renal insufficiency) 06/09/2014  ? Overweight 06/09/2014  ? Coronary atherosclerosis of native coronary artery 12/06/2013  ? Mixed hyperlipidemia 12/06/2013  ? Nonspecific abnormal results of cardiovascular function study 12/06/2013  ? Acute on chronic diastolic CHF (congestive heart failure) ([place]) 12/06/2013  ? Essential hypertension, benign 12/06/2013  ? AKI (acute kidney injury) ([place]) 03/17/2013  ? N&V (nausea and vomiting) 03/17/2013  ? Diarrhea 03/17/2013  ? DM (diabetes mellitus) ([place]) 03/17/2013  ? Hypotension 03/17/2013  ? Dizziness 03/17/2013  ? ? ?Palliative Care Assessment & Plan  ? ? ?Assessment/Recommendations/Plan ? ?Full comfort measures  only ?Hydromorphone '1mg'$  IV now, and q6hours for labored breathing- increase prn dose to '1mg'$  q15 min prn ?Glycopyrrolate .'2mg'$  IV q4hr for secretions ?D/C nasal cannula- likely more a source of agitation than providing comfort at this point ?Awaiting bed availability at [facility] ? ?Code Status: ?DNR ? ?Prognosis: ? < 2 weeks ? ?Discharge Planning: ?Hospice facility ? ?Care plan was discussed with care team. ? ?Thank you for allowing the Palliative Medicine Team to assist in the care of this patient. ? ? ?Mariana Kaufman, AGNP-C ?Palliative Medicine ? ? ?Please contact Palliative Medicine Team phone at [phone] for questions and concerns.  ? ? ? ? ? ? ?

## 2021-10-23 DEATH — deceased

## 2021-11-02 DIAGNOSIS — Z20822 Contact with and (suspected) exposure to covid-19: Secondary | ICD-10-CM | POA: Diagnosis not present

## 2022-04-22 ENCOUNTER — Ambulatory Visit: Payer: Medicare Other | Admitting: Interventional Cardiology
